# Patient Record
Sex: Male | Born: 1960 | Race: Black or African American | Hispanic: No | Marital: Married | Smoking: Never smoker
Health system: Southern US, Community
[De-identification: ages and names within clinical notes are randomized; demographics above are authoritative.]

## PROBLEM LIST (undated history)

## (undated) ENCOUNTER — Emergency Department (HOSPITAL_COMMUNITY): Admission: EM | Payer: No Typology Code available for payment source

## (undated) DIAGNOSIS — N289 Disorder of kidney and ureter, unspecified: Secondary | ICD-10-CM

## (undated) DIAGNOSIS — E785 Hyperlipidemia, unspecified: Secondary | ICD-10-CM

## (undated) DIAGNOSIS — I639 Cerebral infarction, unspecified: Secondary | ICD-10-CM

## (undated) DIAGNOSIS — E119 Type 2 diabetes mellitus without complications: Secondary | ICD-10-CM

## (undated) DIAGNOSIS — I10 Essential (primary) hypertension: Secondary | ICD-10-CM

## (undated) DIAGNOSIS — D869 Sarcoidosis, unspecified: Secondary | ICD-10-CM

## (undated) DIAGNOSIS — I2510 Atherosclerotic heart disease of native coronary artery without angina pectoris: Secondary | ICD-10-CM

## (undated) HISTORY — PX: CARDIAC CATHETERIZATION: SHX172

## (undated) HISTORY — DX: Hyperlipidemia, unspecified: E78.5

## (undated) HISTORY — PX: JOINT REPLACEMENT: SHX530

## (undated) HISTORY — PX: ROTATOR CUFF REPAIR: SHX139

## (undated) HISTORY — DX: Cerebral infarction, unspecified: I63.9

---

## 2002-05-01 ENCOUNTER — Emergency Department (HOSPITAL_COMMUNITY): Admission: EM | Admit: 2002-05-01 | Discharge: 2002-05-02 | Payer: Self-pay | Admitting: Emergency Medicine

## 2004-09-15 ENCOUNTER — Emergency Department (HOSPITAL_COMMUNITY): Admission: EM | Admit: 2004-09-15 | Discharge: 2004-09-15 | Payer: Self-pay | Admitting: Emergency Medicine

## 2005-10-12 ENCOUNTER — Emergency Department (HOSPITAL_COMMUNITY): Admission: EM | Admit: 2005-10-12 | Discharge: 2005-10-13 | Payer: Self-pay | Admitting: Emergency Medicine

## 2005-11-20 ENCOUNTER — Encounter: Admission: RE | Admit: 2005-11-20 | Discharge: 2005-11-20 | Payer: Self-pay | Admitting: Emergency Medicine

## 2006-03-19 ENCOUNTER — Ambulatory Visit (HOSPITAL_BASED_OUTPATIENT_CLINIC_OR_DEPARTMENT_OTHER): Admission: RE | Admit: 2006-03-19 | Discharge: 2006-03-19 | Payer: Self-pay | Admitting: Orthopedic Surgery

## 2006-11-12 ENCOUNTER — Ambulatory Visit: Admission: RE | Admit: 2006-11-12 | Discharge: 2006-11-12 | Payer: Self-pay | Admitting: Orthopedic Surgery

## 2006-11-24 ENCOUNTER — Encounter: Admission: RE | Admit: 2006-11-24 | Discharge: 2007-02-22 | Payer: Self-pay | Admitting: Emergency Medicine

## 2007-01-07 ENCOUNTER — Inpatient Hospital Stay (HOSPITAL_COMMUNITY): Admission: RE | Admit: 2007-01-07 | Discharge: 2007-01-09 | Payer: Self-pay | Admitting: Orthopedic Surgery

## 2007-06-29 ENCOUNTER — Emergency Department (HOSPITAL_COMMUNITY): Admission: EM | Admit: 2007-06-29 | Discharge: 2007-06-29 | Payer: Self-pay | Admitting: Emergency Medicine

## 2007-06-29 ENCOUNTER — Ambulatory Visit: Payer: Self-pay | Admitting: Vascular Surgery

## 2008-04-03 ENCOUNTER — Emergency Department (HOSPITAL_COMMUNITY): Admission: EM | Admit: 2008-04-03 | Discharge: 2008-04-04 | Payer: Self-pay | Admitting: Emergency Medicine

## 2010-09-24 ENCOUNTER — Encounter: Admission: RE | Admit: 2010-09-24 | Discharge: 2010-09-24 | Payer: Self-pay | Admitting: Specialist

## 2010-10-08 ENCOUNTER — Encounter
Admission: RE | Admit: 2010-10-08 | Discharge: 2010-10-08 | Payer: Self-pay | Source: Home / Self Care | Attending: Specialist | Admitting: Specialist

## 2011-03-15 NOTE — Op Note (Signed)
NAME:  Brendan [name], Brendan [name] NO.:  [id number]   MEDICAL RECORD NO.:  [id number]          PATIENT TYPE:  AMB   LOCATION:  DSC                          FACILITY:  [facility]   PHYSICIAN:  Loreta Ave, M.D. DATE OF BIRTH:  1961-08-24   DATE OF PROCEDURE:  03/19/2006  DATE OF DISCHARGE:                                 OPERATIVE REPORT   PREOPERATIVE DIAGNOSIS:  Right knee degenerative arthritis, chondromalacia,  patella, loose bodies, medial and lateral meniscus tear with previous  partial open lateral meniscectomy.   POSTOPERATIVE DIAGNOSIS:  Right knee degenerative arthritis, chondromalacia,  patella, loose bodies, medial and lateral meniscus tear with previous  partial open lateral meniscectomy with grade 3-4 chondral changes to the  patellofemoral, right and lateral compartment.   PROCEDURE:  Right knee examination under anesthesia, arthroscopy, partial  medial and lateral meniscectomy, diffuse chondroplasty in all three  compartments, removal of numerous chondral loose bodies as well as intra-  articular adhesions.   SURGEON:  Loreta Ave, M.D.   ANESTHESIA:  Knee block with sedation.   SPECIMENS:  None.   CULTURES:  None.   DRESSING:  Soft compressive.   TOURNIQUET:  Not employed.   PROCEDURE:  The patient was brought to the operating room.  After adequate  anesthesia had been obtained, the right knee examined.  Mild flexion  contracture.  Further good flexion.  Marked adhesions, decreased  patellofemoral mobility but stable ligaments.  Tourniquet leg holder  applied.  Leg prepped and draped in the usual sterile fashion.  Three ports  were created, one superolateral, and one in each medial and lateral  parapatellar.  Inflow catheter introduced into the knee.  A standard  arthroscope was introduced, and the knee inspected.  Marked intra-articular  adhesions, all debrided.  Good patellofemoral tracking but diffuse  tethering.  Grade 3, even some grade  4 changes, __________ .  Chondroplasty  to a stable surface.  Numerous chondral and osteochondral loose bodies  throughout the knee, debrided.  ACL had spurring in the notch but was still  present and functional.  Medial compartment:  Minimal changes but a large  flap tear off the anterior horn.  The anterior horn removed and __________ .  Posterior medial meniscus intact.  Laterally, extensive grade 2, 3, and even  grade 4 changes, both sides of the joint.  What was left of the lateral  meniscus had marked intrameniscal tearing, numerous flaps.  Taken all the  way out to a stable rim, removing most of what was left of the meniscus.  At  completion, the entire knee examined.  All uneven surfaces were smoothed  with chondroplasty.  All recesses examined to be sure.  All accessible loose  bodies removed.  Instruments and fluid removed.  The portals of the knee were injected with  Marcaine.  The portals closed with 4-0 nylon.  Sterile compressive dressing  applied.  Anesthesia reversed.  Brought to the recovery room.  Tolerated  surgery well.  No complications.      Loreta Ave, M.D.  Electronically Signed     DFM/MEDQ  D:  03/19/2006  T:  03/19/2006  Job:  [id number]

## 2011-03-15 NOTE — Op Note (Signed)
NAME:  Brendan [name], NEAR NO.:  [id number]   MEDICAL RECORD NO.:  [id number]          PATIENT TYPE:  INP   LOCATION:  5011                         FACILITY:  [facility]   PHYSICIAN:  Loreta Ave, M.D. DATE OF BIRTH:  01-30-1961   DATE OF PROCEDURE:  01/07/2007  DATE OF DISCHARGE:                               OPERATIVE REPORT   PREOPERATIVE DIAGNOSIS:  End-stage degenerative arthritis, right knee,  valgus alignment flexure contracture.   POSTOPERATIVE DIAGNOSIS:  End-stage degenerative arthritis, right knee,  valgus alignment flexure contracture.   OPERATIVE PROCEDURE:  Right total knee replacement, minimally invasive  system, Stryker triathlon component, cemented peg, posterior stabilized  #6 femoral component, cemented #6 tibial component with 9 mm posterior  stabilized polyethylene insert.  Resurfacing 35 x 10 mm medial offset  peg patella component.  All components cemented.  Soft tissue balancing.   SURGEON:  Loreta Ave, M.D.   ASSISTANT:  Genene Churn. Denton Meek.   ANESTHESIA:  General.   BLOOD LOSS:  Minimal.   TOURNIQUET TIME:  One hour 40 minutes.   SPECIMENS:  None.   CULTURES:  None.   COMPLICATIONS:  None.   DRESSINGS:  Sterile compressive with knee immobilizer.   DRAINS:  Hemovac x1.   PROCEDURE:  Patient brought to the operating room and after adequate  anesthesia had been obtained, tourniquet was applied.  Prepped and  draped in the usual sterile fashion.  Exsanguinated with elevation  Esmarch.  Tourniquet inflated to 350 mmHg.  Knee examined.  5-7 degree  flexion contracture.  Further flexion to 90.  Valgus alignment not very  correctable.  Anterior incision above the patella to tibial tubercle.  Medial arthrotomy up to the supermedial border of the patella and then  vastus splitting for the minimally invasive system.  Knee exposed.  Loose bodies, periarticular spurs were evident.  Some menisci removed.  Grade 4 changes.  Bony  erosions of the lateral compartment.  Distal  femoral resection 12 mm with intramedullary guide set at 5 degrees of  valgus.  Epicondylar access marked.  Jig was put in place, definitive  cuts made.  Sized and fitted for a pegged #6 component.  Extramedullary  guide.  Sufficient resection of the tibia with 3-degree posterior slope  cut protecting collaterals.  Sized and fitted for a #6 component.  Patella resection posterior 10 mm sized, drilled and fitted for a 35 x  10 mm component.  Trial was put in place, #6 on the femur, #6 on the  tibia, a 35 on the patella.  After soft tissue balancing, especially  laterally, and cleaning out the lateral gutter, I had a 9 mm insert that  gave me full extension, full flexion, nicely balanced knee, excellent  patellofemoral tracking.  Tibia was marked for rotation and hand reamed.  All trials were removed.  Copious irrigation with a pulse irrigating  device.  All recesses examined.  All loose fragments removed.  Cement  prepared and placed on all components which were firmly seated.  Excessive cement removed.  Polyethylene attached to the tibia.  Knee  reduced.  After the cement hardened, reexamined.  Full extension, full  flexion.  Good stability, good alignment, good patellofemoral tracking.  Hemovac placed and brought out through a separate stab wound.  Arthrotomy closed with #1 Vicryl, skin and subcutaneous tissue with  Vicryl and staples.  Knee injected with Marcaine and Hemovac clamp.  A  sterile compressive dressing applied.  Tourniquet inflated and removed.  Knee immobilizer applied.  Anesthesia reversed.  Brought to recovery  room.  Tolerated surgery well.  No complications.      Loreta Ave, M.D.  Electronically Signed     DFM/MEDQ  D:  01/07/2007  T:  01/08/2007  Job:  [id number]

## 2011-07-25 LAB — CBC
HCT: 44.7
Hemoglobin: 15.3
MCHC: 34.2
MCV: 90.3
Platelets: 165
RBC: 4.95
RDW: 13.3
WBC: 21.9 — ABNORMAL HIGH

## 2011-07-25 LAB — DIFFERENTIAL
Basophils Absolute: 0
Basophils Relative: 0
Eosinophils Absolute: 0
Eosinophils Relative: 0
Lymphocytes Relative: 6 — ABNORMAL LOW
Lymphs Abs: 1.3
Monocytes Absolute: 1.5 — ABNORMAL HIGH
Monocytes Relative: 7
Neutro Abs: 19 — ABNORMAL HIGH
Neutrophils Relative %: 87 — ABNORMAL HIGH

## 2011-07-25 LAB — URINE MICROSCOPIC-ADD ON

## 2011-07-25 LAB — POCT CARDIAC MARKERS
CKMB, poc: <1 — ABNORMAL LOW
Myoglobin, poc: 169
Operator id: 261601
Troponin i, poc: <0.05

## 2011-07-25 LAB — URINALYSIS, ROUTINE W REFLEX MICROSCOPIC
Bilirubin Urine: NEGATIVE
Glucose, UA: NEGATIVE
Ketones, ur: 40 — AB
Nitrite: POSITIVE — AB
Protein, ur: 100 — AB
Specific Gravity, Urine: 1.033 — ABNORMAL HIGH
Urobilinogen, UA: 1
pH: 6

## 2011-07-25 LAB — POCT I-STAT, CHEM 8
Chloride: 100
HCT: 46
Hemoglobin: 15.6
Potassium: 3.5
Sodium: 134 — ABNORMAL LOW

## 2011-08-09 LAB — DIFFERENTIAL
Basophils Absolute: 0
Basophils Relative: 0
Eosinophils Absolute: 0.2
Eosinophils Relative: 2
Lymphocytes Relative: 12
Lymphs Abs: 1.1
Monocytes Absolute: 0.6
Monocytes Relative: 6
Neutro Abs: 7.3
Neutrophils Relative %: 80 — ABNORMAL HIGH

## 2011-08-09 LAB — BASIC METABOLIC PANEL
BUN: 9
CO2: 28
Calcium: 9.6
Chloride: 99
Creatinine, Ser: 1.16
GFR calc Af Amer: >60
GFR calc non Af Amer: >60
Glucose, Bld: 202 — ABNORMAL HIGH
Potassium: 4.2
Sodium: 136

## 2011-08-09 LAB — CBC
HCT: 41.5
Hemoglobin: 14.1
MCHC: 34
MCV: 88.1
Platelets: 222
RBC: 4.71
RDW: 14.9 — ABNORMAL HIGH
WBC: 9.1

## 2011-08-09 LAB — URIC ACID: Uric Acid, Serum: 3.9

## 2011-08-09 LAB — D-DIMER, QUANTITATIVE: D-Dimer, Quant: >20 — ABNORMAL HIGH

## 2020-08-24 ENCOUNTER — Encounter (HOSPITAL_COMMUNITY): Payer: Self-pay | Admitting: Emergency Medicine

## 2020-08-24 ENCOUNTER — Emergency Department (HOSPITAL_COMMUNITY)
Admission: EM | Admit: 2020-08-24 | Discharge: 2020-08-24 | Disposition: A | Discharge status: Home / Self Care | Payer: No Typology Code available for payment source | Attending: Emergency Medicine | Admitting: Emergency Medicine

## 2020-08-24 ENCOUNTER — Other Ambulatory Visit: Payer: Self-pay

## 2020-08-24 DIAGNOSIS — Z794 Long term (current) use of insulin: Secondary | ICD-10-CM | POA: Insufficient documentation

## 2020-08-24 DIAGNOSIS — Z91040 Latex allergy status: Secondary | ICD-10-CM | POA: Insufficient documentation

## 2020-08-24 DIAGNOSIS — I10 Essential (primary) hypertension: Secondary | ICD-10-CM | POA: Insufficient documentation

## 2020-08-24 DIAGNOSIS — Z7984 Long term (current) use of oral hypoglycemic drugs: Secondary | ICD-10-CM | POA: Diagnosis not present

## 2020-08-24 DIAGNOSIS — R200 Anesthesia of skin: Secondary | ICD-10-CM | POA: Insufficient documentation

## 2020-08-24 DIAGNOSIS — E119 Type 2 diabetes mellitus without complications: Secondary | ICD-10-CM | POA: Diagnosis not present

## 2020-08-24 HISTORY — DX: Disorder of kidney and ureter, unspecified: N28.9

## 2020-08-24 HISTORY — DX: Sarcoidosis, unspecified: D86.9

## 2020-08-24 HISTORY — DX: Essential (primary) hypertension: I10

## 2020-08-24 HISTORY — DX: Type 2 diabetes mellitus without complications: E11.9

## 2020-08-24 LAB — PROTIME-INR
INR: 1.5 — ABNORMAL HIGH (ref 0.8–1.2)
Prothrombin Time: 17.3 seconds — ABNORMAL HIGH (ref 11.4–15.2)

## 2020-08-24 LAB — BASIC METABOLIC PANEL
Anion gap: 11 (ref 5–15)
BUN: 14 mg/dL (ref 6–20)
CO2: 26 mmol/L (ref 22–32)
Calcium: 9.2 mg/dL (ref 8.9–10.3)
Chloride: 98 mmol/L (ref 98–111)
Creatinine, Ser: 1.81 mg/dL — ABNORMAL HIGH (ref 0.61–1.24)
GFR, Estimated: 43 mL/min — ABNORMAL LOW (ref >60)
Glucose, Bld: 224 mg/dL — ABNORMAL HIGH (ref 70–99)
Potassium: 3.9 mmol/L (ref 3.5–5.1)
Sodium: 135 mmol/L (ref 135–145)

## 2020-08-24 LAB — CBC
HCT: 36.2 % — ABNORMAL LOW (ref 39.0–52.0)
Hemoglobin: 11.9 g/dL — ABNORMAL LOW (ref 13.0–17.0)
MCH: 30.7 pg (ref 26.0–34.0)
MCHC: 32.9 g/dL (ref 30.0–36.0)
MCV: 93.5 fL (ref 80.0–100.0)
Platelets: 157 10*3/uL (ref 150–400)
RBC: 3.87 MIL/uL — ABNORMAL LOW (ref 4.22–5.81)
RDW: 12.8 % (ref 11.5–15.5)
WBC: 10.4 10*3/uL (ref 4.0–10.5)
nRBC: 0 % (ref 0.0–0.2)

## 2020-08-24 LAB — TROPONIN I (HIGH SENSITIVITY): Troponin I (High Sensitivity): 11 ng/L (ref <18)

## 2020-08-24 NOTE — ED Provider Notes (Signed)
[facility]   CSN: [id number] Arrival date & time: 08/24/20  1121     History Chief Complaint  Patient presents with   Numbness    Brendan [name] is a 59 y.o. male possible history of diabetes, hypertension, kidney disease who presents for evaluation of numbness to his left upper lip as well as his left thumb and index finger.  He states this has been going on for 4 days.  4 days ago, he had a total knee replacement surgery when he woke up from anesthesia, he noticed numbness to the lip and to his left thumb and index finger.  He was told that it was an effect from anesthesia and that it would wear off.  He states that it has continued to persist and has been constant since then, prompting ED visit.  No weakness noted.  He states he has not had any other symptoms.  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  He has not had any weakness, slurred speech, difficulty speaking.  He is currently on Xarelto for 14 days which he states he has been compliant with.  He denies any chest pain, difficulty breathing, abdominal pain, nausea/vomiting.  The history is provided by the patient.       Past Medical History:  Diagnosis Date   Diabetes mellitus without complication (HCC)    Hypertension    Kidney disease    Sarcoidosis     There are no problems to display for this patient.   Past Surgical History:  Procedure Laterality Date   JOINT REPLACEMENT         No family history on file.  Social History   Tobacco Use   Smoking status: Never Smoker   Smokeless tobacco: Never Used  Substance Use Topics   Alcohol use: Not Currently   Drug use: Not Currently    Home Medications Prior to Admission medications   Medication Sig Start Date End Date Taking? Authorizing Provider  clobetasol ointment (TEMOVATE) 0.05 % Apply 1  application topically 2 (two) times daily. 04/17/20  Yes [provider]  glimepiride (AMARYL) 4 MG tablet Take 4 mg by mouth daily. 04/20/20  Yes [provider]  insulin aspart (NOVOLOG) 100 UNIT/ML injection Inject 40 Units into the skin daily as needed for high blood sugar.    Yes [provider]  metFORMIN (GLUCOPHAGE) 1000 MG tablet Take 1,000 mg by mouth 2 (two) times daily. 04/18/20  Yes [provider]  methocarbamol (ROBAXIN) 750 MG tablet Take 750 mg by mouth every 6 (six) hours as needed. 08/22/20  Yes [provider]  oxyCODONE (OXY IR/ROXICODONE) 5 MG immediate release tablet Take 5 mg by mouth daily as needed for severe pain.  08/22/20  Yes [provider]  XARELTO 10 MG TABS tablet Take 10 mg by mouth daily. 08/22/20  Yes [provider]    Allergies    Latex and Lisinopril  Review of Systems   Review of Systems  Constitutional: Negative for fever.  Respiratory: Negative for cough and shortness of breath.   Cardiovascular: Negative for chest pain.  Gastrointestinal: Negative for abdominal pain, nausea and vomiting.  Genitourinary: Negative for dysuria and hematuria.  Neurological: Positive for numbness. Negative for weakness and headaches.  All other systems reviewed and are negative.   Physical Exam Updated Vital Signs BP (!) 111/100 (BP  Location: Right Arm)    Pulse (!) 112    Temp 99.1 F (37.3 C) (Oral)    Resp 17    Ht 5\' 10"  (1.778 m)    Wt 97.1 kg    SpO2 98%    BMI 30.71 kg/m   Physical Exam Vitals and nursing note reviewed.  Constitutional:      Appearance: Normal appearance. He is well-developed.  HENT:     Head: Normocephalic and atraumatic.     Mouth/Throat:     Comments: Slight questionable swelling of left upper lip. No overlying warmth, erythema.  No tongue swelling.  Uvula is midline.  Airways patent palpitations intact.  No surrounding erythema, edema.  No obvious dental abscess. Eyes:      General: Lids are normal.     Conjunctiva/sclera: Conjunctivae normal.     Pupils: Pupils are equal, round, and reactive to light.  Cardiovascular:     Rate and Rhythm: Regular rhythm. Tachycardia present.     Pulses: Normal pulses.          Radial pulses are 2+ on the right side and 2+ on the left side.       Dorsalis pedis pulses are 2+ on the right side and 2+ on the left side.     Heart sounds: Normal heart sounds. No murmur heard.  No friction rub. No gallop.   Pulmonary:     Effort: Pulmonary effort is normal.     Breath sounds: Normal breath sounds.     Comments: Lungs clear to auscultation bilaterally.  Symmetric chest rise.  No wheezing, rales, rhonchi. Abdominal:     Palpations: Abdomen is soft. Abdomen is not rigid.     Tenderness: There is no abdominal tenderness. There is no guarding.     Comments: Abdomen is soft, non-distended, non-tender. No rigidity, No guarding. No peritoneal signs.  Musculoskeletal:        General: Normal range of motion.     Cervical back: Full passive range of motion without pain.  Skin:    General: Skin is warm and dry.     Capillary Refill: Capillary refill takes less than 2 seconds.     Comments: Good distal cap refill. LUE is not dusky in appearance or cool to touch.  Neurological:     Mental Status: He is alert and oriented to person, place, and time.     Comments: Cranial nerves III-XII intact Follows commands, Moves all extremities  5/5 strength to BUE and BLE  Reports subjective decreased sensation isolated to the left upper lip as well as the left index and left thumb.  He is able to differentiate sharp and dull and almost every dermatome distribution.  The only one that he mixed up was on the right C6 area (unaffected side).  Otherwise sensation intact throughout all major nerve distributions No pronator drift. No gait abnormalities  No slurred speech. No facial droop.   Psychiatric:        Speech: Speech normal.      ED  Results / Procedures / Treatments   Labs (all labs ordered are listed, but only abnormal results are displayed) Labs Reviewed  BASIC METABOLIC PANEL - Abnormal; Notable for the following components:      Result Value   Glucose, Bld 224 (*)    Creatinine, Ser 1.81 (*)    GFR, Estimated 43 (*)    All other components within normal limits  CBC - Abnormal; Notable for the following components:  RBC 3.87 (*)    Hemoglobin 11.9 (*)    HCT 36.2 (*)    All other components within normal limits  PROTIME-INR - Abnormal; Notable for the following components:   Prothrombin Time 17.3 (*)    INR 1.5 (*)    All other components within normal limits  TROPONIN I (HIGH SENSITIVITY)  TROPONIN I (HIGH SENSITIVITY)    EKG EKG Interpretation  Date/Time:  Thursday August 24 2020 11:30:03 EDT Ventricular Rate:  125 PR Interval:  132 QRS Duration: 88 QT Interval:  308 QTC Calculation: 444 R Axis:   169 Text Interpretation: Sinus tachycardia Indeterminate axis Abnormal QRS-T angle, consider primary T wave abnormality Abnormal ECG No STEMI Confirmed by Alona Bene [phone]) on 08/24/2020 11:40:47 AM   Radiology No results found.  Procedures Procedures (including critical care time)  Medications Ordered in ED Medications - No data to display  ED Course  I have reviewed the triage vital signs and the nursing notes.  Pertinent labs & imaging results that were available during my care of the patient were reviewed by me and considered in my medical decision making (see chart for details).    MDM Rules/Calculators/A&P                          59 year old male past history of diabetes who presents for evaluation of numbness to his left upper lip and left index and thumb finger x4 days.  States symptoms began after he woke up from anesthesia after having a total knee replacement.  He states that it has continued to persist, prompting ED visit.  He has not had any slurred speech, speech  difficulty, weakness.  Currently on Xarelto.  Initially arrival, he is afebrile, nontoxic-appearing.  He is slightly tachycardic.  Vitals otherwise stable.  On exam, he has no evidence of weakness.  He reports his symptoms are isolated to the left upper lip.  It does not involve the entire V3 distribution or the entire left side of his face.  Additionally, is isolated to the left index and left thumb.  He is able to differentiate between sharp and dull in both the spots.  No obvious neuro deficits.  Suspect this is likely effect from anesthesia.  History/physical exam is not concerning for CVA given that it is isolated to these 2 spots that does not involve continuous nerve distributions.  His neuro exam is reassuring.  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  At this time, he has no evidence concerning for anaphylaxis.  Additionally, he has good pulses, cap refill.  We will plan to check labs and observe here in the ED.  BMP shows glucose of 224, Cr of 1.81. CBC shows no leukocytosis. Hgb stable at 11.9.  INR is 1.5.  Discussed with Dr. Lockie Mola who evaluated patient.  At this time, patient is stable.  He is not having any trouble breathing and has been tolerating secretions without any difficulty.  Question if the lip is some slight angioedema secondary to losartan use.  At this time, he is tolerating secretions, able to talk with any difficulty and has no vomiting or difficulty breathing.  Instructed patient to stop his losartan at this time and follow-up with his primary care doctor.  Additionally, history/physical exam is not concerning for CVA.  Indication for emergent MRI imaging at  this time.  He has good pulses, good cap refill.  Do not suspect ischemic etiology. based on history/physical exam.  Portions of this note were generated with Dragon dictation software. Dictation errors may  occur despite best attempts at proofreading.   Final Clinical Impression(s) / ED Diagnoses Final diagnoses:  Numbness    Rx / DC Orders ED Discharge Orders    None       Maxwell Caul, PA-C 08/24/20 1644    Virgina Norfolk, DO 08/24/20 1806

## 2020-08-24 NOTE — ED Provider Notes (Signed)
Medical screening examination/treatment/procedure(s) were conducted as a shared visit with non-physician practitioner(s) and myself.  I personally evaluated the patient during the encounter. Briefly, the patient is a 59 y.o. male with history of high blood pressure presents the ED with numbness.  Normal vitals except for mild tachycardia.  Otherwise no fever.  Had left knee surgery this past week.  Currently on Xarelto.  No shortness of breath.  States that he has had some numbness of his left upper lip and some pins-and-needles feeling to his left thumb and index finger since his surgery about 4 5 days ago.  Neurologically otherwise intact.  Symptoms are consistent with a stroke.  I am actually concerned for may be some mild angioedema to the left upper lip.  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  Is on losartan now but has not taken for the last several days.  Swelling to the upper lip is very mild.  No other respiratory distress.  Recommend that he stopped his losartan.  Overall just having some paresthesias in his hand not consistent with stroke but will have him follow-up with his primary care doctor.  Understands return precautions and discharged in ED in good condition.  This chart was dictated using voice recognition software.  Despite best efforts to proofread,  errors can occur which can change the documentation meaning.     EKG Interpretation  Date/Time:  Thursday August 24 2020 11:30:03 EDT Ventricular Rate:  125 PR Interval:  132 QRS Duration: 88 QT Interval:  308 QTC Calculation: 444 R Axis:   169 Text Interpretation: Sinus tachycardia Indeterminate axis Abnormal QRS-T angle, consider primary T wave abnormality Abnormal ECG No STEMI Confirmed by Alona Bene [phone] on 08/24/2020 11:40:47 AM           Virgina Norfolk, DO 08/24/20 1531

## 2020-08-24 NOTE — ED Notes (Signed)
Pt discharged via wheelchair. All questions and concerns addressed. No complaints at this time.  ° °

## 2020-08-24 NOTE — Discharge Instructions (Addendum)
  We think this may be still effect of anesthesia. If your symptoms persist, you will need to follow up with the referred neurology office. Please call them.   Additionally, immediately be some degree of swelling on the left that could be contributed from your losartan.  Please have your primary care doctor evaluate whether or not you need to be on this medication.  Return to emergency department for any weakness, difficulty speaking, difficulty walking or any other worsening concerning symptoms.

## 2020-08-24 NOTE — ED Triage Notes (Signed)
Pt c/o left side numbness on his face and arm since Monday, pt states he just had knee replaced this past Monday. Pt is AO x 4 no neuro deficit noticed on triage.

## 2020-12-21 ENCOUNTER — Ambulatory Visit: Payer: No Typology Code available for payment source | Admitting: Neurology

## 2021-01-01 ENCOUNTER — Emergency Department (HOSPITAL_COMMUNITY)
Admission: EM | Admit: 2021-01-01 | Discharge: 2021-01-01 | Disposition: A | Discharge status: Home / Self Care | Payer: No Typology Code available for payment source | Attending: Emergency Medicine | Admitting: Emergency Medicine

## 2021-01-01 ENCOUNTER — Emergency Department (HOSPITAL_COMMUNITY): Payer: No Typology Code available for payment source

## 2021-01-01 ENCOUNTER — Encounter (HOSPITAL_COMMUNITY): Payer: Self-pay

## 2021-01-01 DIAGNOSIS — I10 Essential (primary) hypertension: Secondary | ICD-10-CM | POA: Diagnosis not present

## 2021-01-01 DIAGNOSIS — Z79899 Other long term (current) drug therapy: Secondary | ICD-10-CM | POA: Diagnosis not present

## 2021-01-01 DIAGNOSIS — Z7984 Long term (current) use of oral hypoglycemic drugs: Secondary | ICD-10-CM | POA: Diagnosis not present

## 2021-01-01 DIAGNOSIS — Z91040 Latex allergy status: Secondary | ICD-10-CM | POA: Insufficient documentation

## 2021-01-01 DIAGNOSIS — E119 Type 2 diabetes mellitus without complications: Secondary | ICD-10-CM | POA: Insufficient documentation

## 2021-01-01 DIAGNOSIS — Z794 Long term (current) use of insulin: Secondary | ICD-10-CM | POA: Insufficient documentation

## 2021-01-01 DIAGNOSIS — R200 Anesthesia of skin: Secondary | ICD-10-CM | POA: Diagnosis present

## 2021-01-01 DIAGNOSIS — R202 Paresthesia of skin: Secondary | ICD-10-CM | POA: Diagnosis not present

## 2021-01-01 DIAGNOSIS — Z9660 Presence of unspecified orthopedic joint implant: Secondary | ICD-10-CM | POA: Insufficient documentation

## 2021-01-01 DIAGNOSIS — Z87891 Personal history of nicotine dependence: Secondary | ICD-10-CM | POA: Diagnosis not present

## 2021-01-01 DIAGNOSIS — Z7982 Long term (current) use of aspirin: Secondary | ICD-10-CM | POA: Diagnosis not present

## 2021-01-01 LAB — URINALYSIS, ROUTINE W REFLEX MICROSCOPIC
Bilirubin Urine: NEGATIVE
Glucose, UA: 50 mg/dL — AB
Hgb urine dipstick: NEGATIVE
Ketones, ur: NEGATIVE mg/dL
Leukocytes,Ua: NEGATIVE
Nitrite: NEGATIVE
Protein, ur: NEGATIVE mg/dL
Specific Gravity, Urine: 1.023 (ref 1.005–1.030)
pH: 5 (ref 5.0–8.0)

## 2021-01-01 LAB — DIFFERENTIAL
Abs Immature Granulocytes: 0.01 10*3/uL (ref 0.00–0.07)
Basophils Absolute: 0.1 10*3/uL (ref 0.0–0.1)
Basophils Relative: 1 %
Eosinophils Absolute: 0.2 10*3/uL (ref 0.0–0.5)
Eosinophils Relative: 4 %
Immature Granulocytes: 0 %
Lymphocytes Relative: 28 %
Lymphs Abs: 1.6 10*3/uL (ref 0.7–4.0)
Monocytes Absolute: 0.6 10*3/uL (ref 0.1–1.0)
Monocytes Relative: 11 %
Neutro Abs: 3.1 10*3/uL (ref 1.7–7.7)
Neutrophils Relative %: 56 %

## 2021-01-01 LAB — COMPREHENSIVE METABOLIC PANEL
ALT: 29 U/L (ref 0–44)
AST: 24 U/L (ref 15–41)
Albumin: 4.2 g/dL (ref 3.5–5.0)
Alkaline Phosphatase: 81 U/L (ref 38–126)
Anion gap: 7 (ref 5–15)
BUN: 16 mg/dL (ref 6–20)
CO2: 27 mmol/L (ref 22–32)
Calcium: 9.6 mg/dL (ref 8.9–10.3)
Chloride: 104 mmol/L (ref 98–111)
Creatinine, Ser: 1.47 mg/dL — ABNORMAL HIGH (ref 0.61–1.24)
GFR, Estimated: 54 mL/min — ABNORMAL LOW (ref >60)
Glucose, Bld: 99 mg/dL (ref 70–99)
Potassium: 4.1 mmol/L (ref 3.5–5.1)
Sodium: 138 mmol/L (ref 135–145)
Total Bilirubin: 0.9 mg/dL (ref 0.3–1.2)
Total Protein: 7.8 g/dL (ref 6.5–8.1)

## 2021-01-01 LAB — CBC
HCT: 43.7 % (ref 39.0–52.0)
Hemoglobin: 14.2 g/dL (ref 13.0–17.0)
MCH: 29.8 pg (ref 26.0–34.0)
MCHC: 32.5 g/dL (ref 30.0–36.0)
MCV: 91.6 fL (ref 80.0–100.0)
Platelets: 181 10*3/uL (ref 150–400)
RBC: 4.77 MIL/uL (ref 4.22–5.81)
RDW: 13.6 % (ref 11.5–15.5)
WBC: 5.5 10*3/uL (ref 4.0–10.5)
nRBC: 0 % (ref 0.0–0.2)

## 2021-01-01 LAB — CBG MONITORING, ED: Glucose-Capillary: 220 mg/dL — ABNORMAL HIGH (ref 70–99)

## 2021-01-01 MED ORDER — LORAZEPAM 1 MG PO TABS
1.0000 mg | ORAL_TABLET | Freq: Once | ORAL | Status: AC
  Administered 2021-01-01: 1 mg via ORAL
  Filled 2021-01-01: qty 1

## 2021-01-01 NOTE — ED Triage Notes (Signed)
Pt presents with c/o right arm numbness since yesterday. Pt reports a hx of several strokes in the past during surgery. Neuro exam intact, no deficits noted.

## 2021-01-01 NOTE — Discharge Instructions (Addendum)
You will need to make an appointment to follow-up with your neurologist within the next 5 to 7 days for reassessment.  If you have any new or worsening symptoms please return to the emergency department immediately.

## 2021-01-01 NOTE — ED Provider Notes (Signed)
[facility]-EMERGENCY DEPT Provider Note   CSN: [id number] Arrival date & time: 01/01/21  1040     History Chief Complaint  Patient presents with  . Numbness    Brendan [name] is a 60 y.o. male.  HPI     Pt is a 60 y/o male with a h/o DM, HTN, kidney disease, sarcoidosis who presents to the ED today for eval of numbness. States yesterday he woke up and his right arm was numb and feels funny. Denies any weakness to the arm and denies any weakness/numbness to the right leg. Denies any vision changes, speech problems, difficulty with word finding, facial droop, or other symptoms. States he has hx of CVA.   Past Medical History:  Diagnosis Date  . Diabetes mellitus without complication (HCC)   . Hypertension   . Kidney disease   . Sarcoidosis     There are no problems to display for this patient.   Past Surgical History:  Procedure Laterality Date  . JOINT REPLACEMENT         History reviewed. No pertinent family history.  Social History   Tobacco Use  . Smoking status: Never Smoker  . Smokeless tobacco: Never Used  Substance Use Topics  . Alcohol use: Not Currently  . Drug use: Not Currently    Home Medications Prior to Admission medications   Medication Sig Start Date End Date Taking? Authorizing Provider  aspirin 81 MG EC tablet Take 81 mg by mouth daily. 11/28/20 11/28/21 Yes [provider]  atorvastatin (LIPITOR) 40 MG tablet Take 20 mg by mouth at bedtime. 12/27/20  Yes [provider]  clobetasol ointment (TEMOVATE) 0.05 % Apply 1 application topically 2 (two) times daily. 04/17/20  Yes [provider]  diazepam (VALIUM) 5 MG tablet Take 5 mg by mouth daily as needed for anxiety. 12/14/20  Yes [provider]  diclofenac Sodium (VOLTAREN) 1 % GEL Apply 2 g topically daily as needed (pain).   Yes [provider]  Dulaglutide 1.5 MG/0.5ML SOPN Inject 1.5 mg into the skin once a week. 02/24/18   Yes [provider]  ergocalciferol (VITAMIN D2) 1.25 MG (50000 UT) capsule Take 50,000 Units by mouth once a week.   Yes [provider]  glimepiride (AMARYL) 4 MG tablet Take 4 mg by mouth daily. 04/20/20  Yes [provider]  insulin glargine (LANTUS) 100 UNIT/ML injection Inject 40 Units into the skin See admin instructions. Takes only if blood sugar is over 170 - Sliding scale 09/07/20  Yes [provider]  losartan (COZAAR) 50 MG tablet Take 50 mg by mouth daily.   Yes [provider]  metFORMIN (GLUCOPHAGE) 1000 MG tablet Take 1,000 mg by mouth 2 (two) times daily. 04/18/20  Yes [provider]    Allergies    Latex, Lisinopril, and Sildenafil  Review of Systems   Review of Systems  Constitutional: Negative for chills and fever.  HENT: Negative for ear pain and sore throat.   Eyes: Negative for visual disturbance.  Respiratory: Negative for cough and shortness of breath.   Cardiovascular: Negative for chest pain.  Gastrointestinal: Negative for abdominal pain, constipation, diarrhea, nausea and vomiting.  Genitourinary: Negative for dysuria and hematuria.  Musculoskeletal: Negative for back pain and neck pain.  Skin: Negative for color change and rash.  Neurological: Positive for numbness (right arm). Negative for weakness.  All other systems reviewed and are negative.   Physical Exam Updated Vital Signs BP Marland Kitchen)  130/91   Pulse 89   Temp 98.7 F (37.1 C)   Resp 15   Ht 5\' 10"  (1.778 m)   Wt 96.2 kg   SpO2 99%   BMI 30.42 kg/m   Physical Exam Vitals and nursing note reviewed.  Constitutional:      Appearance: He is well-developed and well-nourished.  HENT:     Head: Normocephalic and atraumatic.  Eyes:     Conjunctiva/sclera: Conjunctivae normal.  Cardiovascular:     Rate and Rhythm: Normal rate and regular rhythm.     Heart sounds: Normal heart sounds. No murmur heard.   Pulmonary:     Effort: Pulmonary  effort is normal. No respiratory distress.     Breath sounds: Normal breath sounds. No wheezing, rhonchi or rales.  Abdominal:     General: Bowel sounds are normal.     Palpations: Abdomen is soft.     Tenderness: There is no abdominal tenderness. There is no guarding or rebound.  Musculoskeletal:        General: No edema.     Cervical back: Neck supple.  Skin:    General: Skin is warm and dry.  Neurological:     Mental Status: He is alert.     Comments: Mental Status:  Alert, thought content appropriate, able to give a coherent history. Speech fluent without evidence of aphasia. Able to follow 2 step commands without difficulty.  Cranial Nerves:  II: pupils equal, round, reactive to light III,IV, VI: ptosis not present, extra-ocular motions intact bilaterally  V,VII: smile symmetric, facial light touch sensation equal VIII: hearing grossly normal to voice  X: uvula elevates symmetrically  XI: bilateral shoulder shrug symmetric and strong XII: midline tongue extension without fassiculations Motor:  Normal tone. 5/5 strength of BUE and BLE major muscle groups including strong and equal grip strength and dorsiflexion/plantar flexion Sensory: light touch normal in all extremities.   Psychiatric:        Mood and Affect: Mood and affect normal.     ED Results / Procedures / Treatments   Labs (all labs ordered are listed, but only abnormal results are displayed) Labs Reviewed  COMPREHENSIVE METABOLIC PANEL - Abnormal; Notable for the following components:      Result Value   Creatinine, Ser 1.47 (*)    GFR, Estimated 54 (*)    All other components within normal limits  URINALYSIS, ROUTINE W REFLEX MICROSCOPIC - Abnormal; Notable for the following components:   Glucose, UA 50 (*)    All other components within normal limits  CBG MONITORING, ED - Abnormal; Notable for the following components:   Glucose-Capillary 220 (*)    All other components within normal limits  CBC   DIFFERENTIAL    EKG None  Radiology MR BRAIN WO CONTRAST  Result Date: 01/01/2021 CLINICAL DATA:  Right arm numbness beginning yesterday EXAM: MRI HEAD WITHOUT CONTRAST TECHNIQUE: Multiplanar, multiecho pulse sequences of the brain and surrounding structures were obtained without intravenous contrast. COMPARISON:  10/08/2010 FINDINGS: Brain: Diffusion imaging does not show any acute or subacute infarction or other cause of restricted diffusion. No focal abnormality affects the brainstem or cerebellum. Cerebral hemispheres show an old small right occipital cortical infarction. There are old infarctions in the right thalamus. There are moderate chronic small-vessel ischemic changes elsewhere within the cerebral hemispheric deep and subcortical white matter. No large vessel territory distribution stroke. No mass, recent hemorrhage, hydrocephalus or extra-axial collection. Vascular: Major vessels at the base of the brain show flow.  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 Skull and upper cervical spine: Negative Sinuses/Orbits: Clear/normal Other: None IMPRESSION: 1. No acute finding by MRI. 2. 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 Old infarctions in the right thalamus. Moderate chronic small-vessel ischemic changes of the cerebral hemispheric white matter. 3. 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 Electronically Signed   By: Paulina Fusi M.D.   On: 01/01/2021 14:08    Procedures Procedures   Medications Ordered in ED Medications  LORazepam (ATIVAN) tablet 1 mg (1 mg Oral Given 01/01/21 1240)    ED Course  I have reviewed the triage vital signs and the nursing notes.  Pertinent labs & imaging results that were available during my care of the patient were reviewed by me and considered in my medical decision making (see chart for  details).    MDM Rules/Calculators/A&P                         60 y/o M presenting for RUE numbness that started yesterday  Reviewed/interpreted labs CBC wnl BMP with elevated cr, appears to be at baseline, otherwise unremarkable UA negative  MRI - 1. No acute finding by MRI. 2. 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 Old infarctions in the right thalamus. Moderate chronic small-vessel ischemic changes of the cerebral hemispheric white matter. 3. 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  3:30 PM CONSULT with Dr. Dierdre Harness with neurology who reviewed the patients chart and states that pt likely safe for discharge to f/u with his outpatient neurologist. Does not feel pt requires admission.   Reassessed patient.  Discussed results of MRI and plan for discharge with neurology follow-up.  He voices understanding of the plan and reasons to return.  All Questions answered.  Stable for discharge.  Final Clinical Impression(s) / ED Diagnoses Final diagnoses:  Arm paresthesia, right    Rx / DC Orders ED Discharge Orders    None       Karrie Meres, PA-C 01/01/21 1544    Tegeler, Canary Brim, MD 01/01/21 [phone]

## 2021-01-01 NOTE — ED Notes (Signed)
Gave patient a [place] sandwich and shasta coke

## 2021-01-01 NOTE — ED Notes (Signed)
Patient transported to MRI 

## 2021-01-01 NOTE — ED Provider Notes (Incomplete)
[facility]-EMERGENCY DEPT Provider Note   CSN: [id number] Arrival date & time: 01/01/21  1040     History Chief Complaint  Patient presents with  . Numbness    Brendan [name] is a 60 y.o. male.  HPI   Pt is a 61 y/o male with a h/o DM, HTN, kidney disease, sarcoidosis who presents to the ED today for eval of numbness.  Past Medical History:  Diagnosis Date  . Diabetes mellitus without complication (HCC)   . Hypertension   . Kidney disease   . Sarcoidosis     There are no problems to display for this patient.   Past Surgical History:  Procedure Laterality Date  . JOINT REPLACEMENT         History reviewed. No pertinent family history.  Social History   Tobacco Use  . Smoking status: Never Smoker  . Smokeless tobacco: Never Used  Substance Use Topics  . Alcohol use: Not Currently  . Drug use: Not Currently    Home Medications Prior to Admission medications   Medication Sig Start Date End Date Taking? Authorizing Provider  clobetasol ointment (TEMOVATE) 0.05 % Apply 1 application topically 2 (two) times daily. 04/17/20   [provider]  glimepiride (AMARYL) 4 MG tablet Take 4 mg by mouth daily. 04/20/20   [provider]  insulin aspart (NOVOLOG) 100 UNIT/ML injection Inject 40 Units into the skin daily as needed for high blood sugar.     [provider]  metFORMIN (GLUCOPHAGE) 1000 MG tablet Take 1,000 mg by mouth 2 (two) times daily. 04/18/20   [provider]  methocarbamol (ROBAXIN) 750 MG tablet Take 750 mg by mouth every 6 (six) hours as needed. 08/22/20   [provider]  oxyCODONE (OXY IR/ROXICODONE) 5 MG immediate release tablet Take 5 mg by mouth daily as needed for severe pain.  08/22/20   [provider]  XARELTO 10 MG TABS tablet Take 10 mg by mouth daily. 08/22/20   [provider]    Allergies    Latex and Lisinopril  Review of Systems   Review of  Systems  Physical Exam Updated Vital Signs BP (!) 145/97   Pulse 94   Temp 98.7 F (37.1 C) (Oral)   Resp 18   Ht 5\' 10"  (1.778 m)   Wt 96.2 kg   SpO2 100%   BMI 30.42 kg/m   Physical Exam  ED Results / Procedures / Treatments   Labs (all labs ordered are listed, but only abnormal results are displayed) Labs Reviewed  CBG MONITORING, ED - Abnormal; Notable for the following components:      Result Value   Glucose-Capillary 220 (*)    All other components within normal limits    EKG None  Radiology No results found.  Procedures Procedures {Remember to document critical care time when appropriate:1}  Medications Ordered in ED Medications - No data to display  ED Course  I have reviewed the triage vital signs and the nursing notes.  Pertinent labs & imaging results that were available during my care of the patient were reviewed by me and considered in my medical decision making (see chart for details).    MDM Rules/Calculators/A&P                          *** Final Clinical Impression(s) / ED Diagnoses Final diagnoses:  None    Rx / DC Orders ED Discharge Orders  None

## 2021-07-05 DIAGNOSIS — G4733 Obstructive sleep apnea (adult) (pediatric): Secondary | ICD-10-CM | POA: Diagnosis not present

## 2021-10-05 DIAGNOSIS — D860 Sarcoidosis of lung: Secondary | ICD-10-CM | POA: Diagnosis not present

## 2021-10-05 DIAGNOSIS — E119 Type 2 diabetes mellitus without complications: Secondary | ICD-10-CM | POA: Diagnosis not present

## 2021-10-10 DIAGNOSIS — H579 Unspecified disorder of eye and adnexa: Secondary | ICD-10-CM | POA: Diagnosis not present

## 2021-10-10 DIAGNOSIS — M19041 Primary osteoarthritis, right hand: Secondary | ICD-10-CM | POA: Diagnosis not present

## 2021-10-10 DIAGNOSIS — E1136 Type 2 diabetes mellitus with diabetic cataract: Secondary | ICD-10-CM | POA: Diagnosis not present

## 2021-10-10 DIAGNOSIS — H2513 Age-related nuclear cataract, bilateral: Secondary | ICD-10-CM | POA: Diagnosis not present

## 2021-10-10 DIAGNOSIS — M19042 Primary osteoarthritis, left hand: Secondary | ICD-10-CM | POA: Diagnosis not present

## 2021-11-05 DIAGNOSIS — M65321 Trigger finger, right index finger: Secondary | ICD-10-CM | POA: Diagnosis not present

## 2021-11-05 DIAGNOSIS — Z8673 Personal history of transient ischemic attack (TIA), and cerebral infarction without residual deficits: Secondary | ICD-10-CM | POA: Diagnosis not present

## 2021-11-05 DIAGNOSIS — G5602 Carpal tunnel syndrome, left upper limb: Secondary | ICD-10-CM | POA: Diagnosis not present

## 2021-11-05 DIAGNOSIS — M19041 Primary osteoarthritis, right hand: Secondary | ICD-10-CM | POA: Diagnosis not present

## 2021-12-11 DIAGNOSIS — K08439 Partial loss of teeth due to caries, unspecified class: Secondary | ICD-10-CM | POA: Diagnosis not present

## 2021-12-13 DIAGNOSIS — R531 Weakness: Secondary | ICD-10-CM | POA: Diagnosis not present

## 2021-12-13 DIAGNOSIS — M79641 Pain in right hand: Secondary | ICD-10-CM | POA: Diagnosis not present

## 2021-12-13 DIAGNOSIS — M79642 Pain in left hand: Secondary | ICD-10-CM | POA: Diagnosis not present

## 2021-12-13 DIAGNOSIS — M65351 Trigger finger, right little finger: Secondary | ICD-10-CM | POA: Diagnosis not present

## 2021-12-30 ENCOUNTER — Emergency Department (HOSPITAL_COMMUNITY)
Admission: EM | Admit: 2021-12-30 | Discharge: 2021-12-30 | Disposition: A | Discharge status: Home / Self Care | Payer: No Typology Code available for payment source | Attending: Emergency Medicine | Admitting: Emergency Medicine

## 2021-12-30 ENCOUNTER — Other Ambulatory Visit: Payer: Self-pay

## 2021-12-30 ENCOUNTER — Encounter (HOSPITAL_COMMUNITY): Payer: Self-pay | Admitting: Emergency Medicine

## 2021-12-30 DIAGNOSIS — Z794 Long term (current) use of insulin: Secondary | ICD-10-CM | POA: Diagnosis not present

## 2021-12-30 DIAGNOSIS — Z91040 Latex allergy status: Secondary | ICD-10-CM | POA: Insufficient documentation

## 2021-12-30 DIAGNOSIS — I10 Essential (primary) hypertension: Secondary | ICD-10-CM | POA: Diagnosis not present

## 2021-12-30 DIAGNOSIS — R220 Localized swelling, mass and lump, head: Secondary | ICD-10-CM | POA: Diagnosis not present

## 2021-12-30 DIAGNOSIS — Z7984 Long term (current) use of oral hypoglycemic drugs: Secondary | ICD-10-CM | POA: Diagnosis not present

## 2021-12-30 MED ORDER — FAMOTIDINE 20 MG PO TABS
20.0000 mg | ORAL_TABLET | Freq: Once | ORAL | Status: AC
  Administered 2021-12-30: 20 mg via ORAL
  Filled 2021-12-30: qty 1

## 2021-12-30 MED ORDER — DIPHENHYDRAMINE HCL 25 MG PO CAPS
25.0000 mg | ORAL_CAPSULE | Freq: Once | ORAL | Status: AC
  Administered 2021-12-30: 25 mg via ORAL
  Filled 2021-12-30: qty 1

## 2021-12-30 MED ORDER — PREDNISONE 20 MG PO TABS
60.0000 mg | ORAL_TABLET | Freq: Once | ORAL | Status: AC
  Administered 2021-12-30: 60 mg via ORAL
  Filled 2021-12-30: qty 3

## 2021-12-30 NOTE — Discharge Instructions (Signed)
It was our pleasure to provide your ER care today - we hope that you feel better. ? ?Keep your head elevated. Icepack/coldpack to area. Take benadryl 25 mg every 6 hours today, then as need. Hold your losartan for now.   ? ?Follow up with primary care doctor in the next 1-2 days for recheck if symptoms fail to improve/resolve. ? ?Return to ER if worse, new symptoms, severe pain or swelling to area, fevers, or other concern.  ?

## 2021-12-30 NOTE — ED Provider Notes (Signed)
?[facility]-EMERGENCY DEPT ?Provider Note ? ? ?CSN: [id number] ?Arrival date & time: 12/30/21  2595 ? ?  ? ?History ? ?Chief Complaint  ?Patient presents with  ? Facial Swelling  ? ? ?Brendan [name] is a 61 y.o. male. ? ?Patient c/o swelling above left eye in past day. States awoke with swelling this AM, felt fine last night. Denies any new facial products or chemical exposure. Denies change in foods/diet. No new meds or change in meds. Denies outdoor allergen exposure or any type of bite/sting to eye. Not painful. Slightly itchy. No eye redness or discharge. No eye pain. No change in vision. No lip, mouth or tongue swelling. 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 Denies cough, runny nose or uri symptoms. No other skin lesions or swelling. No headache. Indicates otherwise does not feel sick or ill.  ? ?The history is provided by the patient and medical records.  ? ?  ? ?Home Medications ?Prior to Admission medications   ?Medication Sig Start Date End Date Taking? Authorizing Provider  ?atorvastatin (LIPITOR) 40 MG tablet Take 20 mg by mouth at bedtime. 12/27/20   [provider]  ?clobetasol ointment (TEMOVATE) 0.05 % Apply 1 application topically 2 (two) times daily. 04/17/20   [provider]  ?diazepam (VALIUM) 5 MG tablet Take 5 mg by mouth daily as needed for anxiety. 12/14/20   [provider]  ?diclofenac Sodium (VOLTAREN) 1 % GEL Apply 2 g topically daily as needed (pain).    [provider]  ?Dulaglutide 1.5 MG/0.5ML SOPN Inject 1.5 mg into the skin once a week. 02/24/18   [provider]  ?ergocalciferol (VITAMIN D2) 1.25 MG (50000 UT) capsule Take 50,000 Units by mouth once a week.    [provider]  ?glimepiride (AMARYL) 4 MG tablet Take 4 mg by mouth daily. 04/20/20   [provider]  ?insulin glargine (LANTUS) 100 UNIT/ML injection Inject 40 Units into the skin See admin instructions. Takes only if blood sugar  is over 170 - Sliding scale 09/07/20   [provider]  ?losartan (COZAAR) 50 MG tablet Take 50 mg by mouth daily.    [provider]  ?metFORMIN (GLUCOPHAGE) 1000 MG tablet Take 1,000 mg by mouth 2 (two) times daily. 04/18/20   [provider]  ?   ? ?Allergies    ?Latex, Lisinopril, and Sildenafil   ? ?Review of Systems   ?Review of Systems  ?Constitutional:  Negative for chills and fever.  ?HENT:  Negative for rhinorrhea and sore throat.   ?Eyes:  Negative for pain, discharge, redness and visual disturbance.  ?Respiratory:  Negative for cough and shortness of breath.   ?Cardiovascular:  Negative for chest pain and leg swelling.  ?Gastrointestinal:  Negative for abdominal pain and vomiting.  ?Genitourinary:  Negative for dysuria.  ?Musculoskeletal:  Negative for back pain and neck pain.  ?Skin:  Negative for rash.  ?Neurological:  Negative for headaches.  ?Hematological:  Does not bruise/bleed easily.  ?Psychiatric/Behavioral:  Negative for confusion.   ? ?Physical Exam ?Updated Vital Signs ?BP 136/87 (BP Location: Left Arm)   Pulse 76   Temp 97.8 ?F (36.6 ?C) (Oral)   Resp 18   SpO2 100%  ?Physical Exam ?Vitals and nursing note reviewed.  ?Constitutional:   ?   Appearance: Normal appearance. He is well-developed.  ?HENT:  ?   Head: Atraumatic.  ?   Comments: Mild edema/swelling above left eye/lid area. No abscess or stye to  lid. Conj normal. No orbital or periorbital cellulitis.  ?   Nose: Nose normal.  ?   Mouth/Throat:  ?   Mouth: Mucous membranes are moist.  ?   Pharynx: Oropharynx is clear.  ?   Comments: No mouth, throat or tongue swelling.  ?Eyes:  ?   General: No scleral icterus.    ?   Right eye: No discharge.     ?   Left eye: No discharge.  ?   Extraocular Movements: Extraocular movements intact.  ?   Conjunctiva/sclera: Conjunctivae normal.  ?   Pupils: Pupils are equal, round, and reactive to light.  ?Neck:  ?   Trachea: No tracheal deviation.  ?Cardiovascular:  ?   Rate  and Rhythm: Normal rate.  ?   Pulses: Normal pulses.  ?Pulmonary:  ?   Effort: Pulmonary effort is normal. No accessory muscle usage or respiratory distress.  ?Genitourinary: ?   Comments: No cva tenderness. ?Musculoskeletal:     ?   General: No swelling.  ?   Cervical back: Normal range of motion and neck supple. No rigidity.  ?   Right lower leg: No edema.  ?   Left lower leg: No edema.  ?Lymphadenopathy:  ?   Cervical: No cervical adenopathy.  ?Skin: ?   General: Skin is warm and dry.  ?   Findings: No rash.  ?Neurological:  ?   Mental Status: He is alert.  ?   Comments: Alert, speech clear.   ?Psychiatric:     ?   Mood and Affect: Mood normal.  ? ? ?ED Results / Procedures / Treatments   ?Labs ?(all labs ordered are listed, but only abnormal results are displayed) ?Labs Reviewed - No data to display ? ?EKG ?None ? ?Radiology ?No results found. ? ?Procedures ?Procedures  ? ? ?Medications Ordered in ED ?Medications  ?diphenhydrAMINE (BENADRYL) capsule 25 mg (25 mg Oral Given 12/30/21 0936)  ?famotidine (PEPCID) tablet 20 mg (20 mg Oral Given 12/30/21 0937)  ?predniSONE (DELTASONE) tablet 60 mg (60 mg Oral Given 12/30/21 0936)  ? ? ?ED Course/ Medical Decision Making/ A&P ?  ?                        ?Medical Decision Making ?Problems Addressed: ?Essential hypertension: chronic illness or injury ?Facial swelling: acute illness or injury ? ?Amount and/or Complexity of Data Reviewed ?External Data Reviewed: notes. ? ?Risk ?OTC drugs. ?Prescription drug management. ? ? ?No meds pta.  ? ?Etiology of symptoms not clear from hx.  ? ?Reviewed nursing notes and prior charts for additional history. External reports reviewed.  ? ?Will have hold losartan.  ? ?Prednisone po, benadryl po, pepcid po. ? ?Rec close pcp f/u. ? ?Return precautions provided.  ? ? ? ? ? ? ? ? ? ?Final Clinical Impression(s) / ED Diagnoses ?Final diagnoses:  ?None  ? ? ?Rx / DC Orders ?ED Discharge Orders   ? ? None  ? ?  ? ? ?  ?Cathren Laine, MD ?12/30/21  [phone] ? ?

## 2021-12-30 NOTE — ED Triage Notes (Signed)
Pt reports left eye swelling. Pt states he went to bed and it was "normal". When he woke up the eye was swollen and he had blurry vision. ?

## 2022-01-30 ENCOUNTER — Encounter (HOSPITAL_COMMUNITY): Payer: Self-pay

## 2022-01-30 ENCOUNTER — Other Ambulatory Visit: Payer: Self-pay

## 2022-01-30 ENCOUNTER — Emergency Department (HOSPITAL_COMMUNITY)
Admission: EM | Admit: 2022-01-30 | Discharge: 2022-01-31 | Disposition: A | Discharge status: Home / Self Care | Payer: No Typology Code available for payment source | Attending: Emergency Medicine | Admitting: Emergency Medicine

## 2022-01-30 DIAGNOSIS — N189 Chronic kidney disease, unspecified: Secondary | ICD-10-CM | POA: Diagnosis not present

## 2022-01-30 DIAGNOSIS — Z794 Long term (current) use of insulin: Secondary | ICD-10-CM | POA: Diagnosis not present

## 2022-01-30 DIAGNOSIS — Z91040 Latex allergy status: Secondary | ICD-10-CM | POA: Insufficient documentation

## 2022-01-30 DIAGNOSIS — I129 Hypertensive chronic kidney disease with stage 1 through stage 4 chronic kidney disease, or unspecified chronic kidney disease: Secondary | ICD-10-CM | POA: Insufficient documentation

## 2022-01-30 DIAGNOSIS — Z79899 Other long term (current) drug therapy: Secondary | ICD-10-CM | POA: Diagnosis not present

## 2022-01-30 DIAGNOSIS — Z7984 Long term (current) use of oral hypoglycemic drugs: Secondary | ICD-10-CM | POA: Diagnosis not present

## 2022-01-30 DIAGNOSIS — K08439 Partial loss of teeth due to caries, unspecified class: Secondary | ICD-10-CM | POA: Diagnosis not present

## 2022-01-30 DIAGNOSIS — E119 Type 2 diabetes mellitus without complications: Secondary | ICD-10-CM | POA: Diagnosis not present

## 2022-01-30 DIAGNOSIS — R220 Localized swelling, mass and lump, head: Secondary | ICD-10-CM | POA: Insufficient documentation

## 2022-01-30 MED ORDER — FAMOTIDINE 20 MG PO TABS: 20.0000 mg | ORAL_TABLET | Freq: Once | ORAL | Status: DC

## 2022-01-30 MED ORDER — FAMOTIDINE IN NACL 20-0.9 MG/50ML-% IV SOLN
20.0000 mg | INTRAVENOUS | Status: AC
  Administered 2022-01-30: 20 mg via INTRAVENOUS
  Filled 2022-01-30: qty 50

## 2022-01-30 MED ORDER — METHYLPREDNISOLONE SODIUM SUCC 125 MG IJ SOLR
125.0000 mg | Freq: Once | INTRAMUSCULAR | Status: AC
  Administered 2022-01-30: 125 mg via INTRAVENOUS
  Filled 2022-01-30: qty 2

## 2022-01-30 MED ORDER — PREDNISONE 20 MG PO TABS: 60.0000 mg | ORAL_TABLET | Freq: Once | ORAL | Status: DC

## 2022-01-30 NOTE — ED Provider Notes (Signed)
?[facility] ?Provider Note ? ? ?CSN: [id number] ?Arrival date & time: 01/30/22  2055 ? ?  ? ?History ? ?Chief Complaint  ?Patient presents with  ? Oral Swelling  ? ? ?Brendan [name] is a 61 y.o. male. ? ?The history is provided by the patient and medical records.  ? ?61 year old male with history of hypertension, sarcoidosis, diabetes, chronic kidney disease, presenting to the ED with upper lip swelling.  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  States normally only his lip wil swell up and will go away after taking Benadryl.  Today around 4:30 PM his entire upper lip swelled and has remained persistent despite taking 50 mg of Benadryl.  He denies any tongue swelling, difficulty swallowing, trouble speaking, or pain in the mouth.  He has not had any fever.  He denies any new medications, foods, cosmetic products, etc.  States he did see an allergist in the past about this-- told it was mostly seasonal allergens such as pollen, dust, grass, etc. ? ?Home Medications ?Prior to Admission medications   ?Medication Sig Start Date End Date Taking? Authorizing Provider  ?atorvastatin (LIPITOR) 40 MG tablet Take 20 mg by mouth at bedtime. 12/27/20   [provider]  ?clobetasol ointment (TEMOVATE) [id number] % Apply 1 application topically 2 (two) times daily. 04/17/20   [provider]  ?diazepam (VALIUM) 5 MG tablet Take 5 mg by mouth daily as needed for anxiety. 12/14/20   [provider]  ?diclofenac Sodium (VOLTAREN) 1 % GEL Apply 2 g topically daily as needed (pain).    [provider]  ?Dulaglutide 1.5 MG/0.5ML SOPN Inject 1.5 mg into the skin once a week. 02/24/18   [provider]  ?ergocalciferol (VITAMIN D2) 1.25 MG (50000 UT) capsule Take 50,000 Units by mouth once a week.    [provider]  ?glimepiride (AMARYL) 4 MG tablet Take 4 mg by mouth daily. 04/20/20   [provider]  ?insulin  glargine (LANTUS) 100 UNIT/ML injection Inject 40 Units into the skin See admin instructions. Takes only if blood sugar is over 170 - Sliding scale 09/07/20   [provider]  ?losartan (COZAAR) 50 MG tablet Take 50 mg by mouth daily.    [provider]  ?metFORMIN (GLUCOPHAGE) 1000 MG tablet Take 1,000 mg by mouth 2 (two) times daily. 04/18/20   [provider]  ?   ? ?Allergies    ?Latex, Lisinopril, and Sildenafil   ? ?Review of Systems   ?Review of Systems  ?HENT:  Positive for facial swelling.   ?All other systems reviewed and are negative. ? ?Physical Exam ?Updated Vital Signs ?BP (!) 170/104 (BP Location: Left Arm)   Pulse 91   Temp 98 ?F (36.7 ?C) (Oral)   Resp 20   SpO2 98%  ? ?Physical Exam ?Vitals and nursing note reviewed.  ?Constitutional:   ?   Appearance: He is well-developed.  ?HENT:  ?   Head: Normocephalic and atraumatic.  ?   Mouth/Throat:  ?   Comments: Diffuse upper lip swelling, no swelling noted of tongue, floor of mouth, or roof of mouth; handling secretions well, no stridor ?Eyes:  ?   Conjunctiva/sclera: Conjunctivae normal.  ?   Pupils: Pupils are equal, round, and reactive to light.  ?Cardiovascular:  ?   Rate and Rhythm: Normal rate and regular rhythm.  ?   Heart sounds: Normal heart sounds.  ?Pulmonary:  ?  Effort: Pulmonary effort is normal.  ?   Breath sounds: Normal breath sounds.  ?Abdominal:  ?   General: Bowel sounds are normal.  ?   Palpations: Abdomen is soft.  ?Musculoskeletal:     ?   General: Normal range of motion.  ?   Cervical back: Normal range of motion.  ?Skin: ?   General: Skin is warm and dry.  ?Neurological:  ?   Mental Status: He is alert and oriented to person, place, and time.  ? ? ?ED Results / Procedures / Treatments   ?Labs ?(all labs ordered are listed, but only abnormal results are displayed) ?Labs Reviewed - No data to display ? ?EKG ?None ? ?Radiology ?No results found. ? ?Procedures ?Procedures  ? ? ?Medications Ordered in  ED ?Medications  ?methylPREDNISolone sodium succinate (SOLU-MEDROL) 125 mg/2 mL injection 125 mg (125 mg Intravenous Given 01/30/22 2256)  ?famotidine (PEPCID) IVPB 20 mg premix (20 mg Intravenous New Bag/Given 01/30/22 2257)  ? ? ?ED Course/ Medical Decision Making/ A&P ?  ?                        ?Medical Decision Making ?Risk ?Prescription drug management. ? ? ?61 year old male presenting to the ED with upper lip swelling since 1630 this evening.  Hx of same without known cause.  Not currently on ACEI but does take ARB (Losartan) but has been on this for quite some time.  No other new medications, soaps, foods, etc. He did take 50mg  Benadryl at home prior to arrival without relief.  On exam he has diffuse upper lip swelling without extension to the floor/roof of mouth or tongue.  He is handling secretions well.  Normal phonation without stridor.  Will give solu-medrol, pepcid.  We will observe. ? ?2:19 AM ?Has been observed here for nearly 6 hours with frequent re-checks.  Right side of lip seems to have reduced swelling but left side still quite swollen.  No progression into the mouth or tongue since onset yesterday afternoon (nearly 10 hours ago).  Remains without difficulty swallowing or airway compromise at present.  Feel he is stable for discharge.  We will continue prednisone taper, also given prescription for EpiPen.  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  Strict instructions to return here for any new or acute changes. ? ?Final Clinical Impression(s) / ED Diagnoses ?Final diagnoses:  ?Lip swelling  ? ? ?Rx / DC Orders ?ED Discharge Orders   ? ?      Ordered  ?     ?     ?  EPINEPHrine 0.3 mg/0.3 mL IJ SOAJ injection  As needed       ? 01/31/22 0317  ?  predniSONE (DELTASONE) 20 MG tablet       ? 01/31/22 0317   ? ?  ?  ? ?  ? ? ?  ?Larene Pickett, PA-C ?01/31/22 [phone] ? ?  ?Lucrezia Starch, MD ?01/31/22 2214 ? ?

## 2022-01-30 NOTE — ED Triage Notes (Addendum)
Pt c/o lip swelling starting earlier today. 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 Pt states he took benadryl earlier, it did not help. Pt states he also has left hand and left arm numbness that has been going on since October 2021, pt states he sees a neurologist for the hand and arm numbness.  ?

## 2022-01-30 NOTE — ED Provider Triage Note (Signed)
Emergency Medicine Provider Triage Evaluation Note ? ?Brendan [name] , a 61 y.o. male  was evaluated in triage.  Pt complains of swelling of the  upper lid, onset around 1630 today. Patient has taken benadryl, total of 50 mg, since onset. Prior history of angioedema. Denies new medications or foods. ? ?Review of Systems  ?Positive: Angioedema of upper lip ?Negative: Wheezing, shortness of breath, difficulty swallowing. Nausea or vomiting. Skin rash ? ?Physical Exam  ?BP (!) 147/94   Pulse 100   Temp 97.9 ?F (36.6 ?C)   Resp 18   SpO2 99%  ?Gen:   Awake, no distress   ?Resp:  Normal effort. No wheezing. Lungs CTA bilaterally ?MSK:   Moves extremities without difficulty  ?Other:   ? ?Medical Decision Making  ?Medically screening exam initiated at 9:28 PM.  Appropriate orders placed.  Brendan [name] was informed that the remainder of the evaluation will be completed by another provider, this initial triage assessment does not replace that evaluation, and the importance of remaining in the ED until their evaluation is complete. ? ? ?  ?Felicie Morn, NP ?01/30/22 2132 ? ?

## 2022-01-31 MED ORDER — PREDNISONE 20 MG PO TABS: ORAL_TABLET | ORAL | 0 refills | Status: DC

## 2022-01-31 MED ORDER — EPINEPHRINE 0.3 MG/0.3ML IJ SOAJ: 0.3000 mg | INTRAMUSCULAR | 1 refills | Status: DC | PRN

## 2022-01-31 MED ORDER — EPINEPHRINE 0.3 MG/0.3ML IJ SOAJ: 0.3000 mg | INTRAMUSCULAR | 0 refills | Status: DC | PRN

## 2022-01-31 NOTE — Discharge Instructions (Addendum)
Take the prescribed medication as directed.  Can continue benadryl every 6-8 hours as well.  Keep epi pen with you at all times, use for severe tongue swelling/trouble swallowing/difficulty breathing.  If used, come to the hospital for evaluation. ?Follow-up with your primary care doctor. ?Return to the ED for new or worsening symptoms-- any worsening of swelling, trouble swallowing, sensation of tongue swelling, etc. ?

## 2022-02-06 DIAGNOSIS — I10 Essential (primary) hypertension: Secondary | ICD-10-CM | POA: Diagnosis not present

## 2022-02-06 DIAGNOSIS — E119 Type 2 diabetes mellitus without complications: Secondary | ICD-10-CM | POA: Diagnosis not present

## 2022-02-06 DIAGNOSIS — N183 Chronic kidney disease, stage 3 unspecified: Secondary | ICD-10-CM | POA: Diagnosis not present

## 2022-03-04 DIAGNOSIS — F332 Major depressive disorder, recurrent severe without psychotic features: Secondary | ICD-10-CM | POA: Diagnosis not present

## 2022-03-04 DIAGNOSIS — F411 Generalized anxiety disorder: Secondary | ICD-10-CM | POA: Diagnosis not present

## 2022-03-08 IMAGING — MR MR HEAD W/O CM
13 series · 48 of 48 positions shown · non-contrast
Comparison: 10/08/2010

CLINICAL DATA: Right arm numbness beginning yesterday

EXAM:
MRI HEAD WITHOUT CONTRAST
TECHNIQUE: Multiplanar, multiecho pulse sequences of the brain and surrounding
structures were obtained without intravenous contrast.

[Series 5: DWI · axial · 3.0mm · 1.36mm/px · z∈[+4,+169]mm · 8 of 116 slices shown (1 of 4)]
[im 1/116]
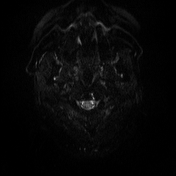
[im 17/116]
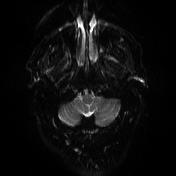
[im 33/116]
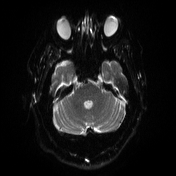
[im 50/116]
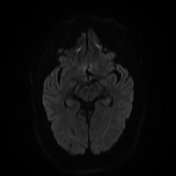
[im 66/116]
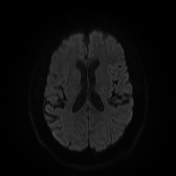
[im 83/116]
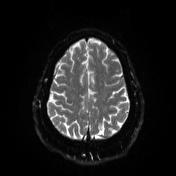
[im 99/116]
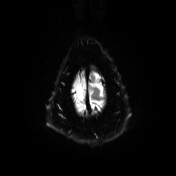
[im 116/116]
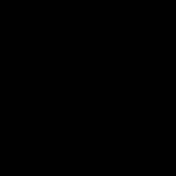

[Series 6: DWI · axial · 3.0mm · 1.36mm/px · z∈[+4,+166]mm · 3 of 57 slices shown (2 of 4)]
[im 1/57]
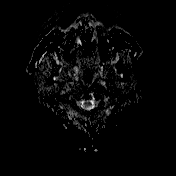
[im 29/57]
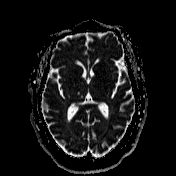
[im 57/57]
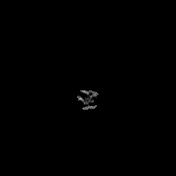

[Series 7: T1 · sagittal · 5.0mm · 0.75mm/px · 1 of 24 slices shown (1 of 2)]
[im 1/24]
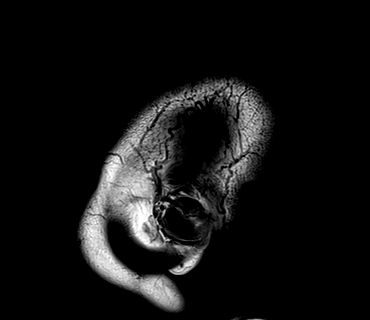

[Series 8: T2 · axial · 5.0mm · 0.62mm/px · z∈[-13,+160]mm · 2 of 28 slices shown (1 of 2)]
[im 1/28]
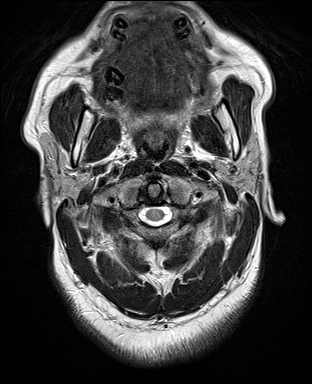
[im 28/28]
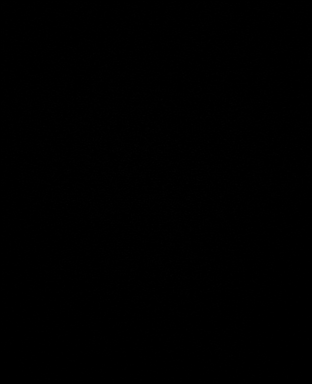

[Series 9: mip_images(sw) · axial · 24.0mm · 0.75mm/px · z∈[-3,+150]mm · 3 of 53 slices shown]
[im 1/53]
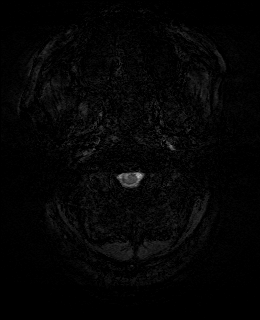
[im 27/53]
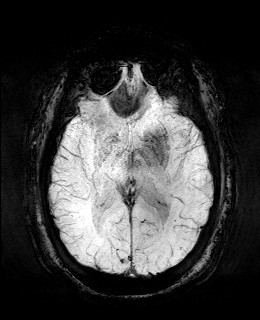
[im 53/53]
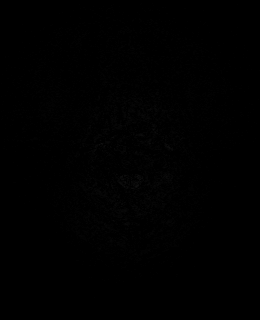

[Series 10: swi_images · axial · 3.0mm · 0.75mm/px · z∈[-14,+161]mm · 4 of 60 slices shown]
[im 1/60]
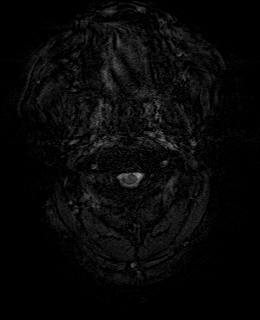
[im 20/60]
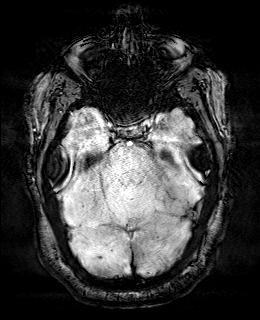
[im 40/60]
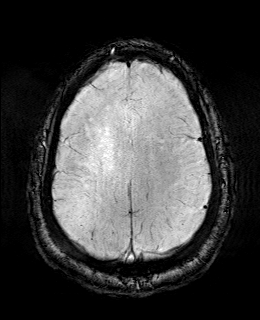
[im 60/60]
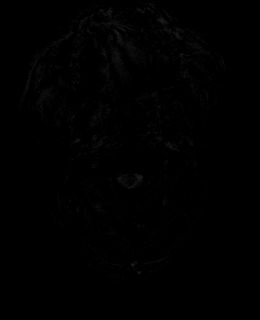

[Series 11: T1 · axial · 1.0mm · 0.94mm/px · z∈[-15,+158]mm · 11 of 176 slices shown (2 of 2)]
[im 1/176]
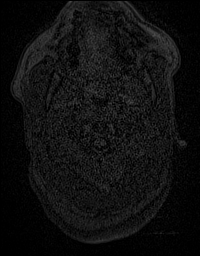
[im 18/176]
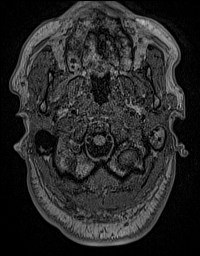
[im 36/176]
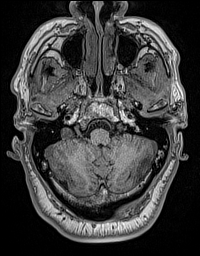
[im 53/176]
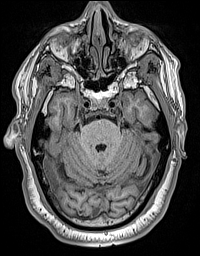
[im 71/176]
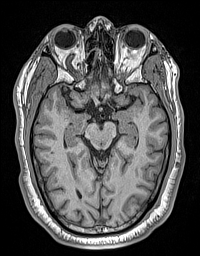
[im 88/176]
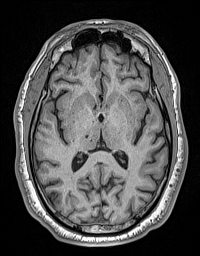
[im 106/176]
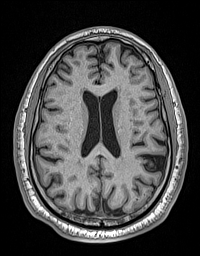
[im 123/176]
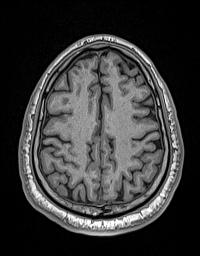
[im 141/176]
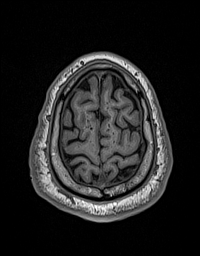
[im 158/176]
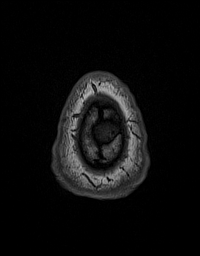
[im 176/176]
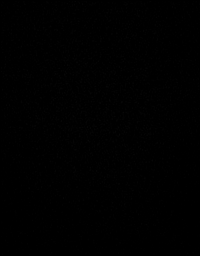

[Series 12: FLAIR · axial · 3.0mm · 0.75mm/px · z∈[-3,+150]mm · 3 of 53 slices shown]
[im 1/53]
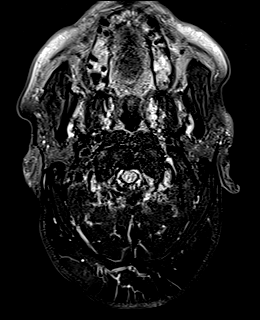
[im 27/53]
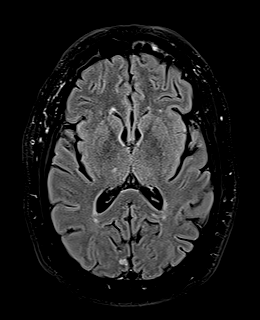
[im 53/53]
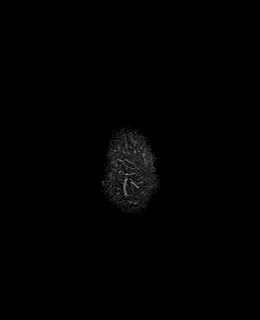

[Series 13: T2 · coronal · 5.0mm · 0.57mm/px · 2 of 36 slices shown (2 of 2)]
[im 1/36]
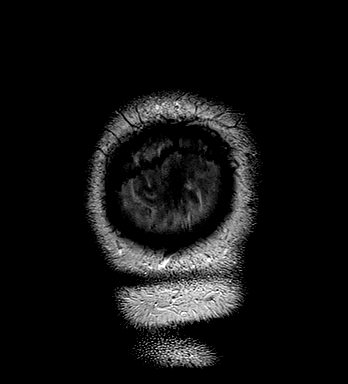
[im 36/36]
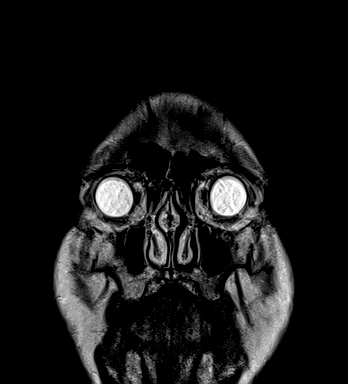

[Series 14: DWI · coronal · 5.0mm · 1.31mm/px · 4 of 72 slices shown (3 of 4)]
[im 1/72]
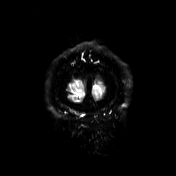
[im 24/72]
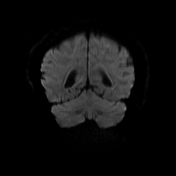
[im 48/72]
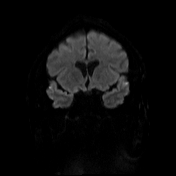
[im 72/72]
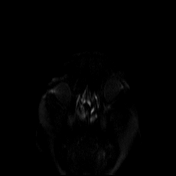

[Series 15: DWI · coronal · 5.0mm · 1.31mm/px · 2 of 36 slices shown (4 of 4)]
[im 1/36]
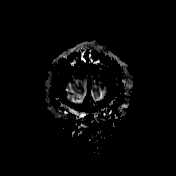
[im 36/36]
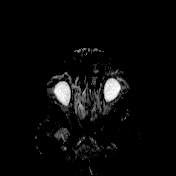

[Series 16: resolve_(id)_trace_cor_tracew · coronal · 5.0mm · 1.53mm/px · 3 of 52 slices shown]
[im 1/52]
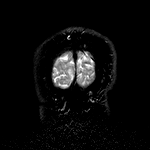
[im 26/52]
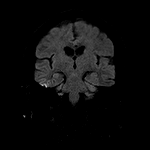
[im 52/52]
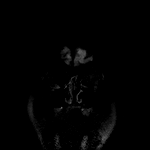

[Series 17: resolve_(id)_trace_cor_adc · coronal · 5.0mm · 1.53mm/px · 2 of 26 slices shown]
[im 1/26]
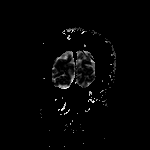
[im 26/26]
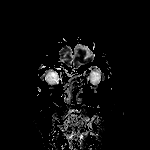

[48 of 48 positions shown; findings below may reference images not displayed]

FINDINGS: Brain: Diffusion imaging does not show any acute or subacute
infarction or other cause of restricted diffusion. No focal
abnormality affects the brainstem or cerebellum. Cerebral
hemispheres show an old small right occipital cortical infarction.
There are old infarctions in the right thalamus. There are moderate
chronic small-vessel ischemic changes elsewhere within the cerebral
hemispheric deep and subcortical white matter. No large vessel
territory distribution stroke. No mass, recent hemorrhage,
hydrocephalus or extra-axial collection.

Vascular: Major vessels at the base of the brain show flow. 3 mm
left posterior communicating artery aneurysm shown by MRA 11 years
ago cannot be visualized using this technique.

Skull and upper cervical spine: Negative

Sinuses/Orbits: Clear/normal

Other: None
IMPRESSION: 1. No acute finding by MRI.
2. Old small right occipital cortical infarction. Old infarctions in
the right thalamus. Moderate chronic small-vessel ischemic changes
of the cerebral hemispheric white matter.
3. 3 mm left posterior communicating artery aneurysm shown by MRA 11
years ago cannot be visualized using this technique.

## 2022-03-12 DIAGNOSIS — K08439 Partial loss of teeth due to caries, unspecified class: Secondary | ICD-10-CM | POA: Diagnosis not present

## 2022-03-14 DIAGNOSIS — E119 Type 2 diabetes mellitus without complications: Secondary | ICD-10-CM | POA: Diagnosis not present

## 2022-03-14 DIAGNOSIS — M545 Low back pain, unspecified: Secondary | ICD-10-CM | POA: Diagnosis not present

## 2022-04-02 DIAGNOSIS — M545 Low back pain, unspecified: Secondary | ICD-10-CM | POA: Diagnosis not present

## 2022-04-02 DIAGNOSIS — G8929 Other chronic pain: Secondary | ICD-10-CM | POA: Diagnosis not present

## 2022-04-03 ENCOUNTER — Encounter: Payer: Self-pay | Admitting: Pulmonary Disease

## 2022-04-03 ENCOUNTER — Ambulatory Visit (INDEPENDENT_AMBULATORY_CARE_PROVIDER_SITE_OTHER): Payer: No Typology Code available for payment source | Admitting: Pulmonary Disease

## 2022-04-03 ENCOUNTER — Ambulatory Visit (INDEPENDENT_AMBULATORY_CARE_PROVIDER_SITE_OTHER): Payer: No Typology Code available for payment source

## 2022-04-03 ENCOUNTER — Telehealth: Payer: Self-pay | Admitting: Pulmonary Disease

## 2022-04-03 VITALS — BP 142/88 | HR 88 | Ht 70.0 in | Wt 213.4 lb

## 2022-04-03 DIAGNOSIS — D869 Sarcoidosis, unspecified: Secondary | ICD-10-CM

## 2022-04-03 DIAGNOSIS — R0602 Shortness of breath: Secondary | ICD-10-CM

## 2022-04-03 MED ORDER — MOMETASONE FURO-FORMOTEROL FUM 100-5 MCG/ACT IN AERO: 2.0000 | INHALATION_SPRAY | Freq: Two times a day (BID) | RESPIRATORY_TRACT | 0 refills | Status: DC | PRN

## 2022-04-03 MED ORDER — FLUTICASONE-SALMETEROL 115-21 MCG/ACT IN AERO: 2.0000 | INHALATION_SPRAY | Freq: Two times a day (BID) | RESPIRATORY_TRACT | 12 refills | Status: DC

## 2022-04-03 NOTE — Patient Instructions (Addendum)
Try advair 115-5mcg 1-2 puffs twice daily as needed for cough, shortness of breath or wheezing. This is for possible reactive airways disease.  We will check Chest x-ray today  We will check breathing tests and EKG at follow up visit  Follow up in 2 months

## 2022-04-03 NOTE — Progress Notes (Signed)
Synopsis: Referred in June 2023 for Sarcoidosis from [place]  Subjective:   PATIENT ID: Brendan [name] GENDER: male DOB: 1961-01-14, MRN: [id number]  HPI  Chief Complaint  Patient presents with   Consult    [facility]. Was diagnosed back in 2010. States he has increased SOB with activity.    Kasson Lamere is a 61 year old male, never smoker with history of DMII and hypertension who is referred to pulmonary clinic for sarcoidosis.   He was diagnosed with sarcoidosis in 2010 and treated with steroids for 1 year then methotrexate for 2-3 years. He was previously monitored with PFTs. He had a swollen lymph node of his right neck removed in 2016 which showed evidence of sarcoidosis. He has bi-annual opathalmology visits and no evidence of inflammation noted 04/13/21. He has intermittent shortness of breath and cough. He has multiple awakenings at night due to dyspnea despite using CPAP regularly. He has follow up at [facility]. He is using albuterol inhaler as needed which does provided some relief.   He is retired. He worked in the [employer]. 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 Got married at 57, divorced at 30. Lives with second  wife x 31 years. His 3 children are grown and independent. 37, 30 and 24.  Past Medical History:  Diagnosis Date   Diabetes mellitus without complication (HCC)    Hypertension    Kidney disease    Sarcoidosis      No family history on file.   Social History   Socioeconomic History   Marital status: Married    Spouse name: Not on file   Number of children: Not on file   Years of education: Not on file   Highest education level: Not on file  Occupational History   Not on file  Tobacco Use   Smoking status: Never   Smokeless tobacco: Never  Substance and Sexual Activity   Alcohol use: Not Currently   Drug use: Not  Currently   Sexual activity: Not on file  Other Topics Concern   Not on file  Social History Narrative   Not on file   Social Determinants of Health   Financial Resource Strain: Not on file  Food Insecurity: Not on file  Transportation Needs: Not on file  Physical Activity: Not on file  Stress: Not on file  Social Connections: Not on file  Intimate Partner Violence: Not on file     Allergies  Allergen Reactions   Latex Swelling   Lisinopril Swelling   Sildenafil Palpitations    Other reaction(s): Headache, Palpitation, Headache, Palpitation, Headache, Palpitations     Outpatient Medications Prior to Visit  Medication Sig Dispense Refill   albuterol (VENTOLIN HFA) 108 (90 Base) MCG/ACT inhaler Inhale 1-2 puffs into the lungs as needed.     amoxicillin (AMOXIL) 500 MG capsule TAKE FOUR CAPSULES BY MOUTH ONCE 1 HOUR PRIOR TO THE DENTAL TREATMENT     aspirin EC 81 MG tablet Take 81 mg by mouth daily. Swallow whole.     atorvastatin (LIPITOR) 40 MG tablet Take 20 mg by mouth at bedtime.     clobetasol ointment (TEMOVATE) 0.05 % Apply 1 application topically 2 (two) times daily.     diazepam (VALIUM) 5 MG tablet Take 5 mg by mouth daily as needed for anxiety.     Dulaglutide 1.5 MG/0.5ML SOPN  Inject 1.5 mg into the skin once a week.     EPINEPHrine 0.3 mg/0.3 mL IJ SOAJ injection Inject 0.3 mg into the muscle as needed for anaphylaxis. 1 each 0   ergocalciferol (VITAMIN D2) 1.25 MG (50000 UT) capsule Take 50,000 Units by mouth once a week.     escitalopram (LEXAPRO) 10 MG tablet Take 10 mg by mouth daily.     glimepiride (AMARYL) 4 MG tablet Take 4 mg by mouth daily.     losartan (COZAAR) 50 MG tablet Take 50 mg by mouth daily.     traZODone (DESYREL) 100 MG tablet Take 100 mg by mouth at bedtime.     zolpidem (AMBIEN) 10 MG tablet Take 1 tablet by mouth at bedtime.     diclofenac Sodium (VOLTAREN) 1 % GEL Apply 2 g topically daily as needed (pain).     insulin glargine (LANTUS)  100 UNIT/ML injection Inject 40 Units into the skin See admin instructions. Takes only if blood sugar is over 170 - Sliding scale     metFORMIN (GLUCOPHAGE) 1000 MG tablet Take 1,000 mg by mouth 2 (two) times daily.     predniSONE (DELTASONE) 20 MG tablet Take 40 mg by mouth daily for 3 days, then  by mouth daily for 3 days, then  daily for 3 days 12 tablet 0   No facility-administered medications prior to visit.   Review of Systems  Constitutional:  Negative for chills, fever, malaise/fatigue and weight loss.  HENT:  Negative for congestion, sinus pain and sore throat.   Eyes: Negative.   Respiratory:  Positive for shortness of breath. Negative for cough, hemoptysis, sputum production and wheezing.   Cardiovascular:  Negative for chest pain, palpitations, orthopnea, claudication and leg swelling.  Gastrointestinal:  Negative for abdominal pain, heartburn, nausea and vomiting.  Genitourinary: Negative.   Musculoskeletal:  Positive for joint pain. Negative for myalgias.  Skin:  Negative for rash.  Neurological:  Negative for weakness.  Endo/Heme/Allergies: Negative.   Psychiatric/Behavioral:  Positive for depression. The patient is nervous/anxious.    Objective:   Vitals:   04/03/22 0937  BP: (!) 142/88  Pulse: 88  SpO2: 97%  Weight: 213 lb 6.4 oz (96.8 kg)  Height:  (1.778 m)   Physical Exam Constitutional:      General: He is not in acute distress. HENT:     Head: Normocephalic and atraumatic.  Eyes:     Extraocular Movements: Extraocular movements intact.     Conjunctiva/sclera: Conjunctivae normal.     Pupils: Pupils are equal, round, and reactive to light.  Cardiovascular:     Rate and Rhythm: Normal rate and regular rhythm.     Pulses: Normal pulses.     Heart sounds: Normal heart sounds. No murmur heard. Pulmonary:     Effort: Pulmonary effort is normal.     Breath sounds: Normal breath sounds.  Abdominal:     General: Bowel sounds are normal.      Palpations: Abdomen is soft.  Musculoskeletal:     Right lower leg: No edema.     Left lower leg: No edema.  Lymphadenopathy:     Cervical: No cervical adenopathy.  Skin:    General: Skin is warm and dry.  Neurological:     General: No focal deficit present.     Mental Status: He is alert.  Psychiatric:        Mood and Affect: Mood normal.        Behavior: Behavior normal.  Thought Content: Thought content normal.        Judgment: Judgment normal.   CBC    Component Value Date/Time   WBC 5.5 01/01/2021 1248   RBC 4.77 01/01/2021 1248   HGB 14.2 01/01/2021 1248   HCT 43.7 01/01/2021 1248   PLT 181 01/01/2021 1248   MCV 91.6 01/01/2021 1248   MCH 29.8 01/01/2021 1248   MCHC 32.5 01/01/2021 1248   RDW 13.6 01/01/2021 1248   LYMPHSABS 1.6 01/01/2021 1248   MONOABS 0.6 01/01/2021 1248   EOSABS 0.2 01/01/2021 1248   BASOSABS 0.1 01/01/2021 1248      Latest Ref Rng & Units 01/01/2021   12:48 PM 08/24/2020   11:40 AM 04/03/2008   11:04 PM  BMP  Glucose 70 - 99 mg/dL 99   287   681    BUN 6 - 20 mg/dL 16   14   13     Creatinine 0.61 - 1.24 mg/dL   1.57   1.5    Sodium 135 - 145 mmol/L 138   135   134    Potassium 3.5 - 5.1 mmol/L 4.1   3.9   3.5    Chloride 98 - 111 mmol/L 104   98   100    CO2 22 - 32 mmol/L 27   26     Calcium 8.9 - 10.3 mg/dL 9.6   9.2      Chest imaging:  PFT:     View : No data to display.          Labs:  Path:  Echo 01/08/21: LVEF 50-55%. Mild concentric hypertrophy. Patent foramen ovale. RV is normal.   Heart Catheterization:  Lower Extremtiy 01/10/21 Bilaterally 01/23/21 Negative for DVT bilaterally  Assessment & Plan:   Sarcoidosis - Plan: fluticasone-salmeterol (ADVAIR HFA) 115-21 MCG/ACT inhaler, DG Chest 2 View, Pulmonary Function Test  Discussion: Ezequias Lard is a 61 year old male, never smoker with history of DMII and hypertension who is referred to pulmonary clinic for sarcoidosis.   We will check a chest  x-ray today. He is to start advair HFA 115-52mcg 2 puffs twice daily as needed for dyspnea and cough. We will check pulmonary function tests and EKG at next visit. He has regular follow up with ophthalmology.   Follow up in 2 months.  22m, MD [facility] Office: [phone]   Current Outpatient Medications:    albuterol (VENTOLIN HFA) 108 (90 Base) MCG/ACT inhaler, Inhale 1-2 puffs into the lungs as needed., Disp: , Rfl:    amoxicillin (AMOXIL) 500 MG capsule, TAKE FOUR CAPSULES BY MOUTH ONCE 1 HOUR PRIOR TO THE DENTAL TREATMENT, Disp: , Rfl:    aspirin EC 81 MG tablet, Take 81 mg by mouth daily. Swallow whole., Disp: , Rfl:    atorvastatin (LIPITOR) 40 MG tablet, Take 20 mg by mouth at bedtime., Disp: , Rfl:    clobetasol ointment (TEMOVATE) 0.05 %, Apply 1 application topically 2 (two) times daily., Disp: , Rfl:    diazepam (VALIUM) 5 MG tablet, Take 5 mg by mouth daily as needed for anxiety., Disp: , Rfl:    Dulaglutide 1.5 MG/0.5ML SOPN, Inject 1.5 mg into the skin once a week., Disp: , Rfl:    EPINEPHrine 0.3 mg/0.3 mL IJ SOAJ injection, Inject 0.3 mg into the muscle as needed for anaphylaxis., Disp: 1 each, Rfl: 0   ergocalciferol (VITAMIN D2) 1.25 MG (50000 UT) capsule, Take 50,000 Units by mouth once  a week., Disp: , Rfl:    escitalopram (LEXAPRO) 10 MG tablet, Take 10 mg by mouth daily., Disp: , Rfl:    fluticasone-salmeterol (ADVAIR HFA) 115-21 MCG/ACT inhaler, Inhale 2 puffs into the lungs 2 (two) times daily., Disp: 1 each, Rfl: 12   glimepiride (AMARYL) 4 MG tablet, Take 4 mg by mouth daily., Disp: , Rfl:    losartan (COZAAR) 50 MG tablet, Take 50 mg by mouth daily., Disp: , Rfl:    traZODone (DESYREL) 100 MG tablet, Take 100 mg by mouth at bedtime., Disp: , Rfl:    zolpidem (AMBIEN) 10 MG tablet, Take 1 tablet by mouth at bedtime., Disp: , Rfl:

## 2022-04-03 NOTE — Telephone Encounter (Signed)
Let's switch to dulera 100-44mcg 2 puffs twice daily as needed with spacer. Prescription sent to [facility].  Thanks, JD

## 2022-04-03 NOTE — Telephone Encounter (Signed)
[name] HFA order and she states that Advair diskus is preferred from the [place]. But she was wanting to know if you wanted the patient to to the [facility] with a spacer and is that what you preferred. She states that if that's the case then [facility] could be ordered for this patient.   Please advise Dr Francine Graven

## 2022-04-03 NOTE — Telephone Encounter (Signed)
ATC Mickel Baas at the [place], left detailed message regarding Dr. August Albino update.  Advised to return call with any questions.

## 2022-04-04 NOTE — Telephone Encounter (Signed)
ATC x2, reached a message each time stating that I could not be transferred to that extension.

## 2022-04-08 NOTE — Telephone Encounter (Signed)
Called the [place] and spoke with Mickel Baas and she said patient was switch to [facility] and that nothing further is needed. Will close this encounter.

## 2022-04-09 DIAGNOSIS — D860 Sarcoidosis of lung: Secondary | ICD-10-CM | POA: Diagnosis not present

## 2022-04-11 DIAGNOSIS — E118 Type 2 diabetes mellitus with unspecified complications: Secondary | ICD-10-CM | POA: Diagnosis not present

## 2022-04-22 ENCOUNTER — Ambulatory Visit (INDEPENDENT_AMBULATORY_CARE_PROVIDER_SITE_OTHER): Payer: No Typology Code available for payment source | Admitting: Primary Care

## 2022-04-22 ENCOUNTER — Encounter: Payer: Self-pay | Admitting: Primary Care

## 2022-04-22 VITALS — BP 120/70 | HR 88 | Temp 98.4°F | Ht 70.0 in | Wt 219.0 lb

## 2022-04-22 DIAGNOSIS — J4520 Mild intermittent asthma, uncomplicated: Secondary | ICD-10-CM

## 2022-04-22 DIAGNOSIS — R0602 Shortness of breath: Secondary | ICD-10-CM | POA: Diagnosis not present

## 2022-04-22 DIAGNOSIS — D869 Sarcoidosis, unspecified: Secondary | ICD-10-CM

## 2022-04-22 DIAGNOSIS — J45909 Unspecified asthma, uncomplicated: Secondary | ICD-10-CM | POA: Insufficient documentation

## 2022-04-22 MED ORDER — PREDNISONE 10 MG PO TABS: ORAL_TABLET | ORAL | 0 refills | Status: DC

## 2022-04-22 MED ORDER — PREDNISONE 10 MG PO TABS: ORAL_TABLET | ORAL | 0 refills | Status: AC

## 2022-04-22 NOTE — Assessment & Plan Note (Addendum)
-   Patient cough has not improved. He has associated dyspnea symptoms and developed a rash to forearms last week. CXR in June showed clear lungs without acute cardiopulmonary process. Checking ACE level today. Sending in prednisone taper 20mg  x 5 days, 15mg  x 5 days, 10mg x 5 days, 5mg  x 5 days. FU in August with Dr. Francine Graven or sooner if needed.

## 2022-04-23 DIAGNOSIS — H527 Unspecified disorder of refraction: Secondary | ICD-10-CM | POA: Diagnosis not present

## 2022-04-24 LAB — ANGIOTENSIN CONVERTING ENZYME: Angiotensin-Converting Enzyme: 16 U/L (ref 9–67)

## 2022-04-24 NOTE — Progress Notes (Signed)
ACE level was normal. CXR normal earlier in June. Not consistent with sarcoid flare. If symptoms do not improve after prednisone please let us know.

## 2022-04-29 ENCOUNTER — Telehealth: Payer: Self-pay | Admitting: Pulmonary Disease

## 2022-04-29 NOTE — Telephone Encounter (Signed)
ACE level was normal. CXR normal earlier in June. Not consistent with sarcoid flare. If symptoms do not improve after prednisone please let us know.    Patient is aware of results and voiced his understanding.  Nothing further needed.

## 2022-05-16 DIAGNOSIS — E119 Type 2 diabetes mellitus without complications: Secondary | ICD-10-CM | POA: Diagnosis not present

## 2022-05-16 DIAGNOSIS — D860 Sarcoidosis of lung: Secondary | ICD-10-CM | POA: Diagnosis not present

## 2022-05-16 DIAGNOSIS — R21 Rash and other nonspecific skin eruption: Secondary | ICD-10-CM | POA: Diagnosis not present

## 2022-05-16 DIAGNOSIS — T783XXD Angioneurotic edema, subsequent encounter: Secondary | ICD-10-CM | POA: Diagnosis not present

## 2022-05-16 DIAGNOSIS — Z7985 Long-term (current) use of injectable non-insulin antidiabetic drugs: Secondary | ICD-10-CM | POA: Diagnosis not present

## 2022-05-16 DIAGNOSIS — M545 Low back pain, unspecified: Secondary | ICD-10-CM | POA: Diagnosis not present

## 2022-05-16 DIAGNOSIS — Z7984 Long term (current) use of oral hypoglycemic drugs: Secondary | ICD-10-CM | POA: Diagnosis not present

## 2022-06-03 ENCOUNTER — Ambulatory Visit (INDEPENDENT_AMBULATORY_CARE_PROVIDER_SITE_OTHER): Payer: No Typology Code available for payment source | Admitting: Pulmonary Disease

## 2022-06-03 ENCOUNTER — Encounter: Payer: Self-pay | Admitting: Pulmonary Disease

## 2022-06-03 VITALS — BP 138/82 | HR 87 | Ht 70.5 in | Wt 218.4 lb

## 2022-06-03 DIAGNOSIS — D869 Sarcoidosis, unspecified: Secondary | ICD-10-CM | POA: Diagnosis not present

## 2022-06-03 DIAGNOSIS — J984 Other disorders of lung: Secondary | ICD-10-CM

## 2022-06-03 LAB — PULMONARY FUNCTION TEST
DL/VA % pred: 97 %
DL/VA: 4.08 ml/min/mmHg/L
DLCO cor % pred: 66 %
DLCO cor: 18.61 ml/min/mmHg
DLCO unc % pred: 66 %
DLCO unc: 18.61 ml/min/mmHg
FEF 25-75 Post: 3.34 L/sec
FEF 25-75 Pre: 3.28 L/sec
FEF2575-%Change-Post: 1 %
FEF2575-%Pred-Post: 112 %
FEF2575-%Pred-Pre: 110 %
FEV1-%Change-Post: 0 %
FEV1-%Pred-Post: 71 %
FEV1-%Pred-Pre: 71 %
FEV1-Post: 2.59 L
FEV1-Pre: 2.59 L
FEV1FVC-%Change-Post: 2 %
FEV1FVC-%Pred-Pre: 112 %
FEV6-%Change-Post: -2 %
FEV6-%Pred-Post: 64 %
FEV6-%Pred-Pre: 66 %
FEV6-Post: 2.98 L
FEV6-Pre: 3.05 L
FEV6FVC-%Change-Post: 0 %
FEV6FVC-%Pred-Post: 104 %
FEV6FVC-%Pred-Pre: 104 %
FVC-%Change-Post: -2 %
FVC-%Pred-Post: 61 %
FVC-%Pred-Pre: 63 %
FVC-Post: 2.98 L
FVC-Pre: 3.05 L
Post FEV1/FVC ratio: 87 %
Post FEV6/FVC ratio: 100 %
Pre FEV1/FVC ratio: 85 %
Pre FEV6/FVC Ratio: 100 %
RV % pred: 75 %
RV: 1.73 L
TLC % pred: 70 %
TLC: 5.01 L

## 2022-06-03 NOTE — Progress Notes (Unsigned)
Synopsis: Referred in June 2023 for Sarcoidosis from [place]  Subjective:   PATIENT ID: Gearldine Bienenstock GENDER: male DOB: 16-Dec-1960, MRN: [id number]  HPI  Chief Complaint  Patient presents with   Follow-up    F/U after PFT. States his SOB has improved but still has a dry cough.    Nuel Dejaynes is a 61 year old male, never smoker with history of DMII and hypertension who returns to pulmonary clinic for sarcoidosis and cough.   He was trialed on dulera 100-64mcg 2 puffs twice daily without relief of his cough. He was seen by Buelah Manis, NP 04/22/22 for on going cough. He was placed on extended steroid taper at that time for the cough. ACE level was checked and not elevated.   The steroids helped his shortness of breath but not the cough.  His breathing tests showed mild restrictive defect and mild diffusion defect.   Initial OV 04/03/22 He was diagnosed with sarcoidosis in 2010 and treated with steroids for 1 year then methotrexate for 2-3 years. He was previously monitored with PFTs. He had a swollen lymph node of his right neck removed in 2016 which showed evidence of sarcoidosis. He has bi-annual opathalmology visits and no evidence of inflammation noted 04/13/21. He has intermittent shortness of breath and cough. He has multiple awakenings at night due to dyspnea despite using CPAP regularly. He has follow up at [facility]. He is using albuterol inhaler as needed which does provided some relief.   He is retired. He worked in the [employer]. 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 Got married at 34, divorced at 36. Lives with second  wife x 31 years. His 3 children are grown and independent. 37, 30 and 24.  Past Medical History:  Diagnosis Date   Diabetes mellitus without complication (HCC)    Hypertension    Kidney disease    Sarcoidosis      No family history on file.   Social  History   Socioeconomic History   Marital status: Married    Spouse name: Not on file   Number of children: Not on file   Years of education: Not on file   Highest education level: Not on file  Occupational History   Not on file  Tobacco Use   Smoking status: Never   Smokeless tobacco: Never  Substance and Sexual Activity   Alcohol use: Not Currently   Drug use: Not Currently   Sexual activity: Not on file  Other Topics Concern   Not on file  Social History Narrative   Not on file   Social Determinants of Health   Financial Resource Strain: Not on file  Food Insecurity: Not on file  Transportation Needs: Not on file  Physical Activity: Not on file  Stress: Not on file  Social Connections: Not on file  Intimate Partner Violence: Not on file     Allergies  Allergen Reactions   Latex Swelling   Lisinopril Swelling   Sildenafil Palpitations    Other reaction(s): Headache, Palpitation, Headache, Palpitation, Headache, Palpitations     Outpatient Medications Prior to Visit  Medication Sig Dispense Refill   albuterol (VENTOLIN HFA) 108 (90 Base) MCG/ACT inhaler Inhale 1-2 puffs into the lungs as needed.     amoxicillin (AMOXIL) 500 MG capsule TAKE FOUR CAPSULES BY MOUTH ONCE 1 HOUR PRIOR TO THE DENTAL TREATMENT  aspirin EC 81 MG tablet Take 81 mg by mouth daily. Swallow whole.     atorvastatin (LIPITOR) 40 MG tablet Take 20 mg by mouth at bedtime.     clobetasol ointment (TEMOVATE) 0.05 % Apply 1 application topically 2 (two) times daily.     diazepam (VALIUM) 5 MG tablet Take 5 mg by mouth daily as needed for anxiety.     Dulaglutide 1.5 MG/0.5ML SOPN Inject 1.5 mg into the skin once a week.     EPINEPHrine 0.3 mg/0.3 mL IJ SOAJ injection Inject 0.3 mg into the muscle as needed for anaphylaxis. 1 each 0   ergocalciferol (VITAMIN D2) 1.25 MG (50000 UT) capsule Take 50,000 Units by mouth once a week.     escitalopram (LEXAPRO) 10 MG tablet Take 10 mg by mouth daily.      glimepiride (AMARYL) 4 MG tablet Take 4 mg by mouth daily.     losartan (COZAAR) 50 MG tablet Take 50 mg by mouth daily.     mometasone-formoterol (DULERA) 100-5 MCG/ACT AERO Inhale 2 puffs into the lungs 2 (two) times daily as needed for wheezing. 1 each 0   traZODone (DESYREL) 100 MG tablet Take 100 mg by mouth at bedtime.     zolpidem (AMBIEN) 10 MG tablet Take 1 tablet by mouth at bedtime.     No facility-administered medications prior to visit.   Review of Systems  Constitutional:  Negative for chills, fever, malaise/fatigue and weight loss.  HENT:  Negative for congestion, sinus pain and sore throat.   Eyes: Negative.   Respiratory:  Positive for shortness of breath. Negative for cough, hemoptysis, sputum production and wheezing.   Cardiovascular:  Negative for chest pain, palpitations, orthopnea, claudication and leg swelling.  Gastrointestinal:  Negative for abdominal pain, heartburn, nausea and vomiting.  Genitourinary: Negative.   Musculoskeletal:  Positive for joint pain. Negative for myalgias.  Skin:  Negative for rash.  Neurological:  Negative for weakness.  Endo/Heme/Allergies: Negative.   Psychiatric/Behavioral:  Positive for depression. The patient is nervous/anxious.    Objective:   Vitals:   06/03/22 1051  BP: 138/82  Pulse: 87  SpO2: 98%  Weight: 218 lb 6.4 oz (99.1 kg)  Height: 5' 10.5" (1.791 m)   Physical Exam Constitutional:      General: He is not in acute distress. HENT:     Head: Normocephalic and atraumatic.  Eyes:     Extraocular Movements: Extraocular movements intact.     Conjunctiva/sclera: Conjunctivae normal.     Pupils: Pupils are equal, round, and reactive to light.  Cardiovascular:     Rate and Rhythm: Normal rate and regular rhythm.     Pulses: Normal pulses.     Heart sounds: Normal heart sounds. No murmur heard. Pulmonary:     Effort: Pulmonary effort is normal.     Breath sounds: Normal breath sounds.  Abdominal:     General:  Bowel sounds are normal.     Palpations: Abdomen is soft.  Musculoskeletal:     Right lower leg: No edema.     Left lower leg: No edema.  Lymphadenopathy:     Cervical: No cervical adenopathy.  Skin:    General: Skin is warm and dry.  Neurological:     General: No focal deficit present.     Mental Status: He is alert.  Psychiatric:        Mood and Affect: Mood normal.        Behavior: Behavior normal.  Thought Content: Thought content normal.        Judgment: Judgment normal.    CBC    Component Value Date/Time   WBC 5.5 01/01/2021 1248   RBC 4.77 01/01/2021 1248   HGB 14.2 01/01/2021 1248   HCT 43.7 01/01/2021 1248   PLT 181 01/01/2021 1248   MCV 91.6 01/01/2021 1248   MCH 29.8 01/01/2021 1248   MCHC 32.5 01/01/2021 1248   RDW 13.6 01/01/2021 1248   LYMPHSABS 1.6 01/01/2021 1248   MONOABS 0.6 01/01/2021 1248   EOSABS 0.2 01/01/2021 1248   BASOSABS 0.1 01/01/2021 1248      Latest Ref Rng & Units 01/01/2021   12:48 PM 08/24/2020   11:40 AM 04/03/2008   11:04 PM  BMP  Glucose 70 - 99 mg/dL 99  829  937   BUN 6 - 20 mg/dL 16  14  13    Creatinine 0.61 - 1.24 mg/dL  1.69  1.5   Sodium 135 - 145 mmol/L 138  135  134   Potassium 3.5 - 5.1 mmol/L 4.1  3.9  3.5   Chloride 98 - 111 mmol/L 104  98  100   CO2 22 - 32 mmol/L 27  26    Calcium 8.9 - 10.3 mg/dL 9.6  9.2     Chest imaging:  PFT:    Latest Ref Rng & Units 06/03/2022   10:04 AM  PFT Results  FVC-Pre L 3.05   FVC-Predicted Pre % 63   FVC-Post L 2.98   FVC-Predicted Post % 61   Pre FEV1/FVC % % 85   Post FEV1/FCV % % 87   FEV1-Pre L 2.59   FEV1-Predicted Pre % 71   FEV1-Post L 2.59   DLCO uncorrected ml/min/mmHg 18.61   DLCO UNC% % 66   DLCO corrected ml/min/mmHg 18.61   DLCO COR %Predicted % 66   DLVA Predicted % 97   TLC L 5.01   TLC % Predicted % 70   RV % Predicted % 75     Labs:  Path:  Echo 01/08/21: LVEF 50-55%. Mild concentric hypertrophy. Patent foramen ovale. RV is normal.    Heart Catheterization:  Lower Extremtiy 01/10/21 Bilaterally 01/23/21 Negative for DVT bilaterally  EKG 01/02/21 Sinus rhythm. QRS 93, Qtc 446, PR 158.   Assessment & Plan:   Sarcoidosis - Plan: CT CHEST HIGH RESOLUTION  Restrictive lung disease - Plan: CT CHEST HIGH RESOLUTION  Discussion: Nicholaos Schippers is a 61 year old male, never smoker with history of DMII and hypertension who returns to pulmonary clinic for sarcoidosis.   We will check HRCT Chest given his restrictive defect and diffusion defect on PFTs today.   His dry cough did not respond to ICS/LABA inhaler therapy.   He will need updated EKG in the future.  Follow up in 3 months.  77, MD [facility] Office: [phone]   Current Outpatient Medications:    albuterol (VENTOLIN HFA) 108 (90 Base) MCG/ACT inhaler, Inhale 1-2 puffs into the lungs as needed., Disp: , Rfl:    amoxicillin (AMOXIL) 500 MG capsule, TAKE FOUR CAPSULES BY MOUTH ONCE 1 HOUR PRIOR TO THE DENTAL TREATMENT, Disp: , Rfl:    aspirin EC 81 MG tablet, Take 81 mg by mouth daily. Swallow whole., Disp: , Rfl:    atorvastatin (LIPITOR) 40 MG tablet, Take 20 mg by mouth at bedtime., Disp: , Rfl:    clobetasol ointment (TEMOVATE) 0.05 %, Apply 1 application topically 2 (  two) times daily., Disp: , Rfl:    diazepam (VALIUM) 5 MG tablet, Take 5 mg by mouth daily as needed for anxiety., Disp: , Rfl:    Dulaglutide 1.5 MG/0.5ML SOPN, Inject 1.5 mg into the skin once a week., Disp: , Rfl:    EPINEPHrine 0.3 mg/0.3 mL IJ SOAJ injection, Inject 0.3 mg into the muscle as needed for anaphylaxis., Disp: 1 each, Rfl: 0   ergocalciferol (VITAMIN D2) 1.25 MG (50000 UT) capsule, Take 50,000 Units by mouth once a week., Disp: , Rfl:    escitalopram (LEXAPRO) 10 MG tablet, Take 10 mg by mouth daily., Disp: , Rfl:    glimepiride (AMARYL) 4 MG tablet, Take 4 mg by mouth daily., Disp: , Rfl:    losartan (COZAAR) 50 MG tablet, Take 50 mg by  mouth daily., Disp: , Rfl:    mometasone-formoterol (DULERA) 100-5 MCG/ACT AERO, Inhale 2 puffs into the lungs 2 (two) times daily as needed for wheezing., Disp: 1 each, Rfl: 0   traZODone (DESYREL) 100 MG tablet, Take 100 mg by mouth at bedtime., Disp: , Rfl:    zolpidem (AMBIEN) 10 MG tablet, Take 1 tablet by mouth at bedtime., Disp: , Rfl:

## 2022-06-03 NOTE — Progress Notes (Signed)
PFT done today. 

## 2022-06-03 NOTE — Patient Instructions (Addendum)
Your breathing tests show mild restrictive defect (the lungs are stiffer than normal) and a mild diffusion defect (oxygen has a harder time traveling across the lung tissue)  We will check a high resolution CT Chest scan to further evaluate the abnormalities on the breathing tests  We will call you with the results and the next steps from there.  Follow up in 3 months.

## 2022-06-05 ENCOUNTER — Encounter: Payer: Self-pay | Admitting: Pulmonary Disease

## 2022-06-14 ENCOUNTER — Ambulatory Visit (HOSPITAL_COMMUNITY)
Admission: RE | Admit: 2022-06-14 | Discharge: 2022-06-14 | Disposition: A | Discharge status: Home / Self Care | Payer: No Typology Code available for payment source | Source: Ambulatory Visit | Attending: Pulmonary Disease | Admitting: Pulmonary Disease

## 2022-06-14 DIAGNOSIS — J984 Other disorders of lung: Secondary | ICD-10-CM | POA: Diagnosis present

## 2022-06-14 DIAGNOSIS — D869 Sarcoidosis, unspecified: Secondary | ICD-10-CM | POA: Insufficient documentation

## 2022-06-20 ENCOUNTER — Other Ambulatory Visit: Payer: Self-pay

## 2022-06-20 ENCOUNTER — Telehealth: Payer: Self-pay | Admitting: Pulmonary Disease

## 2022-06-20 DIAGNOSIS — K036 Deposits [accretions] on teeth: Secondary | ICD-10-CM | POA: Diagnosis not present

## 2022-06-20 DIAGNOSIS — I2510 Atherosclerotic heart disease of native coronary artery without angina pectoris: Secondary | ICD-10-CM

## 2022-06-20 DIAGNOSIS — K08409 Partial loss of teeth, unspecified cause, unspecified class: Secondary | ICD-10-CM | POA: Diagnosis not present

## 2022-06-20 DIAGNOSIS — K0251 Dental caries on pit and fissure surface limited to enamel: Secondary | ICD-10-CM | POA: Diagnosis not present

## 2022-06-20 NOTE — Telephone Encounter (Signed)
Printed off office note and CT report and faxed a copy to the [place] as patient requested. And also mailed him a copy of the office note and CT report as requested. Nothing further needed

## 2022-06-21 ENCOUNTER — Telehealth: Payer: Self-pay | Admitting: Pulmonary Disease

## 2022-06-21 NOTE — Telephone Encounter (Signed)
Called and spoke with patient. Advised patient that I would print off office notes and the report of his CT scan. Office notes and CT scan report has been printed and taken to the front desk for patient to pick up.   Nothing further needed.

## 2022-06-21 NOTE — Telephone Encounter (Signed)
Brendan [name] from [place] is returning phone call. Brendan [name] phone number is [phone]. Fax number is [phone].

## 2022-06-21 NOTE — Telephone Encounter (Signed)
Spoke with cory from [facility]. He stated that he was going [facility] and [facility] that [facility]. [facility] was provided to cory.  Nothing further needed.

## 2022-06-21 NOTE — Telephone Encounter (Signed)
ATC patient. Could not leave vm due to the mailbox being full. Will try to call again.

## 2022-07-04 DIAGNOSIS — K08409 Partial loss of teeth, unspecified cause, unspecified class: Secondary | ICD-10-CM | POA: Diagnosis not present

## 2022-07-05 DIAGNOSIS — K08409 Partial loss of teeth, unspecified cause, unspecified class: Secondary | ICD-10-CM | POA: Diagnosis not present

## 2022-07-11 NOTE — Progress Notes (Unsigned)
Cardiology Office Note:    Date:  07/15/2022   ID:  Brendan [name], DOB [birth year], MRN [id number]  PCP:  Default, Provider, MD   [facility] Providers Cardiologist:  None     Referring MD: Brendan Sinner, MD   Chief Complaint  Patient presents with   Coronary Artery Disease    History of Present Illness:    Brendan [name] is a 61 y.o. male seen at the request of Brendan [name] for evaluation of coronary and aortic calcification noted on CT. He has a history of Sarcoidosis, DM, HLD, and OSA on CPAP. He did have an Echo in March 2022 at [facility] showing EF 50-55%. Mild LVH. Mild diastolic dysfunction. There was an interatrial septal aneurysm with shunt (PFO) but agitated saline. Mild MR. Cranial MRI at that time showed 2 old lacunar infarcts and a small old cortical infarct in the posterior inferior right occipital lobe. CTA of head and neck showed [name] significant stenosis. He was placed on ASA by Neurology then.  He does note his activity has been limited since his bilateral TKR. He does have some SOB on exertion that he attributes to his sarcoidosis. His primary care (Brendan [name]) in [place] follows his lab work. [name] prior stress test, MI or CHF. [name] syncope.   Past Medical History:  Diagnosis Date   Diabetes mellitus without complication (HCC)    Hyperlipidemia    Hypertension    Kidney disease    Sarcoidosis    Stroke [facility])     Past Surgical History:  Procedure Laterality Date   JOINT REPLACEMENT Bilateral    TKR   ROTATOR CUFF REPAIR Left     Current Medications: Current Meds  Medication Sig   albuterol (VENTOLIN HFA) 108 (90 Base) MCG/ACT inhaler Inhale 1-2 puffs into the lungs as needed.   aspirin EC 81 MG tablet Take 81 mg by mouth daily. Swallow whole.   atorvastatin (LIPITOR) 40 MG tablet Take 20 mg by mouth at bedtime.   diazepam (VALIUM) 5 MG tablet Take 5 mg by mouth daily as needed for anxiety.   Dulaglutide 1.5 MG/0.5ML SOPN Inject  1.5 mg into the skin once a week.   escitalopram (LEXAPRO) 10 MG tablet Take 10 mg by mouth daily.   glimepiride (AMARYL) 4 MG tablet Take 4 mg by mouth daily.   losartan (COZAAR) 50 MG tablet Take 50 mg by mouth daily.   mometasone-formoterol (DULERA) 100-5 MCG/ACT AERO Inhale 2 puffs into the lungs 2 (two) times daily as needed for wheezing.   traZODone (DESYREL) 100 MG tablet Take 100 mg by mouth at bedtime.   zolpidem (AMBIEN) 10 MG tablet Take 1 tablet by mouth at bedtime.     Allergies:   Latex, Lisinopril, and Sildenafil   Social History   Socioeconomic History   Marital status: Married    Spouse name: Not on file   Number of children: 3   Years of education: Not on file   Highest education level: Not on file  Occupational History   Not on file  Tobacco Use   Smoking status: Never   Smokeless tobacco: Never  Substance and Sexual Activity   Alcohol use: Not Currently   Drug use: Not Currently   Sexual activity: Not on file  Other Topics Concern   Not on file  Social History Narrative   Retired Art gallery manager for [employer]   Social Determinants of Corporate investment banker Strain: Not on [employer]  Insecurity: Not on file  Transportation Needs: Not on file  Physical Activity: Not on file  Stress: Not on file  Social Connections: Not on file     Family History: The patient's family history includes Heart failure (age of onset: 25) in his mother.  ROS:   Please see the history of present illness.     All other systems reviewed and are negative.  EKGs/Labs/Other Studies Reviewed:    The following studies were reviewed today: CLINICAL DATA:  Sarcoidosis. Restrictive lung disease. Evaluate for interstitial lung disease.   EXAM: CT CHEST WITHOUT CONTRAST   TECHNIQUE: Multidetector CT imaging of the chest was performed following the standard protocol without intravenous contrast. High resolution imaging of the lungs, as well as inspiratory and expiratory  imaging, was performed.   RADIATION DOSE REDUCTION: This exam was performed according to the departmental dose-optimization program which includes automated exposure control, adjustment of the mA and/or kV according to patient size and/or use of iterative reconstruction technique.   COMPARISON:  04/03/2022 chest radiograph.   FINDINGS: Cardiovascular: Normal heart size. [name] significant pericardial effusion/thickening. Three-vessel coronary atherosclerosis. Mildly atherosclerotic nonaneurysmal thoracic aorta. Normal caliber pulmonary arteries.   Mediastinum/Nodes: [name] discrete thyroid nodules. Unremarkable esophagus. [name] axillary adenopathy. Shotty nonenlarged paratracheal and prevascular mediastinal nodes. [name] pathologically enlarged mediastinal or discrete hilar nodes on these noncontrast images.   Lungs/Pleura: [name] pneumothorax. [name] pleural effusion. Several scattered small solid peribronchovascular the perifissural and subpleural pulmonary nodules in both lungs, for example a 0.5 cm right upper lobe peribronchovascular nodule (series 6/image 65) and a 0.5 cm left major fissure nodule (series 6/image 69). [name] acute consolidative airspace disease or lung masses. [name] significant regions of subpleural reticulation, ground-glass opacity, traction bronchiectasis, architectural distortion or frank honeycombing. [name] significant lobular air trapping or evidence of tracheobronchomalacia on the expiration sequence.   Upper abdomen: Subcentimeter posterior right liver dome hypodense lesion is too small to characterize.   Musculoskeletal: [name] aggressive appearing focal osseous lesions. Mild thoracic spondylosis.   IMPRESSION: 1. Scattered perilymphatic distribution small solid pulmonary nodules, largest 0.5 cm, compatible with pulmonary sarcoidosis. 2. [name] evidence of interstitial lung disease. 3. Three-vessel coronary atherosclerosis. 4. Aortic Atherosclerosis (ICD10-I70.0).      Electronically Signed   By: Brendan [name] M.D.   On: 06/17/2022 15:38  EKG:  EKG is  ordered today.  The ekg ordered today demonstrates NSR rate 79. T wave abnormality c/w lateral ischemia. LAD. I have personally reviewed and interpreted this study.   Recent Labs: [name] results found for requested labs within last 365 days.  Recent Lipid Panel [name] results found for: "CHOL", "TRIG", "HDL", "CHOLHDL", "VLDL", "LDLCALC", "LDLDIRECT"   Risk Assessment/Calculations:                Physical Exam:    VS:  BP 138/80   Pulse 79   Ht 5\' 10"  (1.778 m)   Wt 217 lb 3.2 oz (98.5 kg)   SpO2 97%   BMI 31.16 kg/m     Wt Readings from Last 3 Encounters:  07/15/22 217 lb 3.2 oz (98.5 kg)  06/03/22 218 lb 6.4 oz (99.1 kg)  04/22/22 219 lb (99.3 kg)     GEN:  Well nourished, well developed in [name] acute distress HEENT: Normal NECK: [name] JVD; [name] carotid bruits LYMPHATICS: [name] lymphadenopathy CARDIAC: RRR, [name] murmurs, rubs, gallops RESPIRATORY:  Clear to auscultation without rales, wheezing or rhonchi  ABDOMEN: Soft, non-tender, non-distended MUSCULOSKELETAL:  [name] edema; [name] deformity  SKIN: Warm and  dry NEUROLOGIC:  Alert and oriented x 3 PSYCHIATRIC:  Normal affect   ASSESSMENT:    1. Coronary artery calcification   2. Type 2 diabetes mellitus without complication, without long-term current use of insulin (HCC)   3. Primary hypertension   4. Hyperlipidemia, unspecified hyperlipidemia type   5. Dyspnea on exertion    PLAN:    In order of problems listed above:  Coronary artery calcification. Multiple coronary risk factors. He does have some DOE which could be anginal equivalent. [name] chest pain. Recommend stress myoview to further evaluate ischemic risk. If low risk would continue risk factor modification. If high risk would need to consider cardiac cath DM on oral therapy HLD. On statin. Request copy of lab work from PCP HTN. Controlled on losartan OSA Sarcoidosis. Per pulmonary. [name]  overt evidence of cardiac sarcoid.  History of CVA on ASA     Shared Decision Making/Informed Consent The risks [chest pain, shortness of breath, cardiac arrhythmias, dizziness, blood pressure fluctuations, myocardial infarction, stroke/transient ischemic attack, nausea, vomiting, allergic reaction, radiation exposure, metallic taste sensation and life-threatening complications (estimated to be 1 in 10,000)], benefits (risk stratification, diagnosing coronary artery disease, treatment guidance) and alternatives of a nuclear stress test were discussed in detail with Mr. Sotelo and he agrees to proceed.    Medication Adjustments/Labs and Tests Ordered: Current medicines are reviewed at length with the patient today.  Concerns regarding medicines are outlined above.  Orders Placed This Encounter  Procedures   Cardiac Stress Test: Informed Consent Details: Physician/Practitioner Attestation; Transcribe to consent form and obtain patient signature   EKG 12-Lead   Meds ordered this encounter  Medications   amoxicillin (AMOXIL) 500 MG capsule    Sig: TAKE FOUR CAPSULES BY MOUTH ONCE 1 HOUR PRIOR TO THE DENTAL TREATMENT    Dispense:  4 capsule    Refill:  3    There are [name] Patient Instructions on file for this visit.   Signed, Quetzally Callas [place], MD  07/15/2022 9:06 AM    [facility]

## 2022-07-11 NOTE — H&P (View-Only) (Signed)
Cardiology Office Note:    Date:  07/15/2022   ID:  Gearldine Bienenstock, DOB 06-Nov-1960, MRN [id number]  PCP:  Default, Provider, MD   [facility] Providers Cardiologist:  None     Referring MD: Martina Sinner, MD   Chief Complaint  Patient presents with   Coronary Artery Disease    History of Present Illness:    Brendan [name] is a 61 y.o. male seen at the request of Dr Francine Graven for evaluation of coronary and aortic calcification noted on CT. He has a history of Sarcoidosis, DM, HLD, and OSA on CPAP. He did have an Echo in March 2022 at [facility] showing EF 50-55%. Mild LVH. Mild diastolic dysfunction. There was an interatrial septal aneurysm with shunt (PFO) but agitated saline. Mild MR. Cranial MRI at that time showed 2 old lacunar infarcts and a small old cortical infarct in the posterior inferior right occipital lobe. CTA of head and neck showed no significant stenosis. He was placed on ASA by Neurology then.  He does note his activity has been limited since his bilateral TKR. He does have some SOB on exertion that he attributes to his sarcoidosis. His primary care (Dr Francene Castle) in [place] follows his lab work. No prior stress test, MI or CHF. No syncope.   Past Medical History:  Diagnosis Date   Diabetes mellitus without complication (HCC)    Hyperlipidemia    Hypertension    Kidney disease    Sarcoidosis    Stroke [facility])     Past Surgical History:  Procedure Laterality Date   JOINT REPLACEMENT Bilateral    TKR   ROTATOR CUFF REPAIR Left     Current Medications: Current Meds  Medication Sig   albuterol (VENTOLIN HFA) 108 (90 Base) MCG/ACT inhaler Inhale 1-2 puffs into the lungs as needed.   aspirin EC 81 MG tablet Take 81 mg by mouth daily. Swallow whole.   atorvastatin (LIPITOR) 40 MG tablet Take 20 mg by mouth at bedtime.   diazepam (VALIUM) 5 MG tablet Take 5 mg by mouth daily as needed for anxiety.   Dulaglutide 1.5 MG/0.5ML SOPN Inject  1.5 mg into the skin once a week.   escitalopram (LEXAPRO) 10 MG tablet Take 10 mg by mouth daily.   glimepiride (AMARYL) 4 MG tablet Take 4 mg by mouth daily.   losartan (COZAAR) 50 MG tablet Take 50 mg by mouth daily.   mometasone-formoterol (DULERA) 100-5 MCG/ACT AERO Inhale 2 puffs into the lungs 2 (two) times daily as needed for wheezing.   traZODone (DESYREL) 100 MG tablet Take 100 mg by mouth at bedtime.   zolpidem (AMBIEN) 10 MG tablet Take 1 tablet by mouth at bedtime.     Allergies:   Latex, Lisinopril, and Sildenafil   Social History   Socioeconomic History   Marital status: Married    Spouse name: Not on file   Number of children: 3   Years of education: Not on file   Highest education level: Not on file  Occupational History   Not on file  Tobacco Use   Smoking status: Never   Smokeless tobacco: Never  Substance and Sexual Activity   Alcohol use: Not Currently   Drug use: Not Currently   Sexual activity: Not on file  Other Topics Concern   Not on file  Social History Narrative   Retired Art gallery manager for [employer]   Social Determinants of Corporate investment banker Strain: Not on [employer]  Insecurity: Not on file  Transportation Needs: Not on file  Physical Activity: Not on file  Stress: Not on file  Social Connections: Not on file     Family History: The patient's family history includes Heart failure (age of onset: 25) in his mother.  ROS:   Please see the history of present illness.     All other systems reviewed and are negative.  EKGs/Labs/Other Studies Reviewed:    The following studies were reviewed today: CLINICAL DATA:  Sarcoidosis. Restrictive lung disease. Evaluate for interstitial lung disease.   EXAM: CT CHEST WITHOUT CONTRAST   TECHNIQUE: Multidetector CT imaging of the chest was performed following the standard protocol without intravenous contrast. High resolution imaging of the lungs, as well as inspiratory and expiratory  imaging, was performed.   RADIATION DOSE REDUCTION: This exam was performed according to the departmental dose-optimization program which includes automated exposure control, adjustment of the mA and/or kV according to patient size and/or use of iterative reconstruction technique.   COMPARISON:  04/03/2022 chest radiograph.   FINDINGS: Cardiovascular: Normal heart size. No significant pericardial effusion/thickening. Three-vessel coronary atherosclerosis. Mildly atherosclerotic nonaneurysmal thoracic aorta. Normal caliber pulmonary arteries.   Mediastinum/Nodes: No discrete thyroid nodules. Unremarkable esophagus. No axillary adenopathy. Shotty nonenlarged paratracheal and prevascular mediastinal nodes. No pathologically enlarged mediastinal or discrete hilar nodes on these noncontrast images.   Lungs/Pleura: No pneumothorax. No pleural effusion. Several scattered small solid peribronchovascular the perifissural and subpleural pulmonary nodules in both lungs, for example a 0.5 cm right upper lobe peribronchovascular nodule (series 6/image 65) and a 0.5 cm left major fissure nodule (series 6/image 69). No acute consolidative airspace disease or lung masses. No significant regions of subpleural reticulation, ground-glass opacity, traction bronchiectasis, architectural distortion or frank honeycombing. No significant lobular air trapping or evidence of tracheobronchomalacia on the expiration sequence.   Upper abdomen: Subcentimeter posterior right liver dome hypodense lesion is too small to characterize.   Musculoskeletal: No aggressive appearing focal osseous lesions. Mild thoracic spondylosis.   IMPRESSION: 1. Scattered perilymphatic distribution small solid pulmonary nodules, largest 0.5 cm, compatible with pulmonary sarcoidosis. 2. No evidence of interstitial lung disease. 3. Three-vessel coronary atherosclerosis. 4. Aortic Atherosclerosis (ICD10-I70.0).      Electronically Signed   By: Delbert Phenix M.D.   On: 06/17/2022 15:38  EKG:  EKG is  ordered today.  The ekg ordered today demonstrates NSR rate 79. T wave abnormality c/w lateral ischemia. LAD. I have personally reviewed and interpreted this study.   Recent Labs: No results found for requested labs within last 365 days.  Recent Lipid Panel No results found for: "CHOL", "TRIG", "HDL", "CHOLHDL", "VLDL", "LDLCALC", "LDLDIRECT"   Risk Assessment/Calculations:                Physical Exam:    VS:  BP 138/80   Pulse 79   Ht 5\' 10"  (1.778 m)   Wt 217 lb 3.2 oz (98.5 kg)   SpO2 97%   BMI 31.16 kg/m     Wt Readings from Last 3 Encounters:  07/15/22 217 lb 3.2 oz (98.5 kg)  06/03/22 218 lb 6.4 oz (99.1 kg)  04/22/22 219 lb (99.3 kg)     GEN:  Well nourished, well developed in no acute distress HEENT: Normal NECK: No JVD; No carotid bruits LYMPHATICS: No lymphadenopathy CARDIAC: RRR, no murmurs, rubs, gallops RESPIRATORY:  Clear to auscultation without rales, wheezing or rhonchi  ABDOMEN: Soft, non-tender, non-distended MUSCULOSKELETAL:  No edema; No deformity  SKIN: Warm and  dry NEUROLOGIC:  Alert and oriented x 3 PSYCHIATRIC:  Normal affect   ASSESSMENT:    1. Coronary artery calcification   2. Type 2 diabetes mellitus without complication, without long-term current use of insulin (HCC)   3. Primary hypertension   4. Hyperlipidemia, unspecified hyperlipidemia type   5. Dyspnea on exertion    PLAN:    In order of problems listed above:  Coronary artery calcification. Multiple coronary risk factors. He does have some DOE which could be anginal equivalent. No chest pain. Recommend stress myoview to further evaluate ischemic risk. If low risk would continue risk factor modification. If high risk would need to consider cardiac cath DM on oral therapy HLD. On statin. Request copy of lab work from PCP HTN. Controlled on losartan OSA Sarcoidosis. Per pulmonary. No  overt evidence of cardiac sarcoid.  History of CVA on ASA     Shared Decision Making/Informed Consent The risks [chest pain, shortness of breath, cardiac arrhythmias, dizziness, blood pressure fluctuations, myocardial infarction, stroke/transient ischemic attack, nausea, vomiting, allergic reaction, radiation exposure, metallic taste sensation and life-threatening complications (estimated to be 1 in 10,000)], benefits (risk stratification, diagnosing coronary artery disease, treatment guidance) and alternatives of a nuclear stress test were discussed in detail with Mr. Sotelo and he agrees to proceed.    Medication Adjustments/Labs and Tests Ordered: Current medicines are reviewed at length with the patient today.  Concerns regarding medicines are outlined above.  Orders Placed This Encounter  Procedures   Cardiac Stress Test: Informed Consent Details: Physician/Practitioner Attestation; Transcribe to consent form and obtain patient signature   EKG 12-Lead   Meds ordered this encounter  Medications   amoxicillin (AMOXIL) 500 MG capsule    Sig: TAKE FOUR CAPSULES BY MOUTH ONCE 1 HOUR PRIOR TO THE DENTAL TREATMENT    Dispense:  4 capsule    Refill:  3    There are no Patient Instructions on file for this visit.   Signed, Iman Reinertsen [place], MD  07/15/2022 9:06 AM    [facility]

## 2022-07-15 ENCOUNTER — Encounter: Payer: Self-pay | Admitting: Cardiology

## 2022-07-15 ENCOUNTER — Ambulatory Visit: Payer: No Typology Code available for payment source | Attending: Cardiology | Admitting: Cardiology

## 2022-07-15 VITALS — BP 138/80 | HR 79 | Ht 70.0 in | Wt 217.2 lb

## 2022-07-15 DIAGNOSIS — E785 Hyperlipidemia, unspecified: Secondary | ICD-10-CM | POA: Diagnosis not present

## 2022-07-15 DIAGNOSIS — I2510 Atherosclerotic heart disease of native coronary artery without angina pectoris: Secondary | ICD-10-CM | POA: Diagnosis not present

## 2022-07-15 DIAGNOSIS — I2584 Coronary atherosclerosis due to calcified coronary lesion: Secondary | ICD-10-CM | POA: Diagnosis not present

## 2022-07-15 DIAGNOSIS — I10 Essential (primary) hypertension: Secondary | ICD-10-CM

## 2022-07-15 DIAGNOSIS — E119 Type 2 diabetes mellitus without complications: Secondary | ICD-10-CM

## 2022-07-15 DIAGNOSIS — R0609 Other forms of dyspnea: Secondary | ICD-10-CM

## 2022-07-15 MED ORDER — AMOXICILLIN 500 MG PO CAPS: ORAL_CAPSULE | ORAL | 3 refills | Status: AC

## 2022-07-15 NOTE — Patient Instructions (Signed)
Medication Instructions:  Continue all medications   Lab Work: None ordered   Testing/Procedures: Stress Myoview   Follow-Up: At [facility], you and your health needs are our priority.  As part of our continuing mission to provide you with exceptional heart care, we have created designated Provider Care Teams.  These Care Teams include your primary Cardiologist (physician) and Advanced Practice Providers (APPs -  Physician Assistants and Nurse Practitioners) who all work together to provide you with the care you need, when you need it.  We recommend signing up for the patient portal called "MyChart".  Sign up information is provided on this After Visit Summary.  MyChart is used to connect with patients for Virtual Visits (Telemedicine).  Patients are able to view lab/test results, encounter notes, upcoming appointments, etc.  Non-urgent messages can be sent to your provider as well.   To learn more about what you can do with MyChart, go to [email or web address].    Your next appointment:  After Test      The format for your next appointment: Office     Provider:  Dr.Jordan   Important Information About Sugar

## 2022-07-18 DIAGNOSIS — E785 Hyperlipidemia, unspecified: Secondary | ICD-10-CM | POA: Diagnosis not present

## 2022-07-18 DIAGNOSIS — E119 Type 2 diabetes mellitus without complications: Secondary | ICD-10-CM | POA: Diagnosis not present

## 2022-07-18 DIAGNOSIS — D869 Sarcoidosis, unspecified: Secondary | ICD-10-CM | POA: Diagnosis not present

## 2022-07-24 ENCOUNTER — Telehealth (HOSPITAL_COMMUNITY): Payer: Self-pay | Admitting: *Deleted

## 2022-07-24 NOTE — Telephone Encounter (Signed)
Close encounter 

## 2022-07-25 ENCOUNTER — Ambulatory Visit (HOSPITAL_COMMUNITY)
Admission: RE | Admit: 2022-07-25 | Discharge: 2022-07-25 | Disposition: A | Discharge status: Home / Self Care | Payer: No Typology Code available for payment source | Source: Ambulatory Visit | Attending: Cardiovascular Disease | Admitting: Cardiovascular Disease

## 2022-07-25 DIAGNOSIS — I10 Essential (primary) hypertension: Secondary | ICD-10-CM | POA: Insufficient documentation

## 2022-07-25 DIAGNOSIS — I2584 Coronary atherosclerosis due to calcified coronary lesion: Secondary | ICD-10-CM | POA: Diagnosis present

## 2022-07-25 DIAGNOSIS — E785 Hyperlipidemia, unspecified: Secondary | ICD-10-CM | POA: Diagnosis not present

## 2022-07-25 DIAGNOSIS — R0609 Other forms of dyspnea: Secondary | ICD-10-CM | POA: Insufficient documentation

## 2022-07-25 DIAGNOSIS — I2510 Atherosclerotic heart disease of native coronary artery without angina pectoris: Secondary | ICD-10-CM | POA: Diagnosis not present

## 2022-07-25 DIAGNOSIS — E119 Type 2 diabetes mellitus without complications: Secondary | ICD-10-CM | POA: Insufficient documentation

## 2022-07-25 LAB — MYOCARDIAL PERFUSION IMAGING
Angina Index: 0
Duke Treadmill Score: 8
Estimated workload: 9.3
Exercise duration (min): 7 min
Exercise duration (sec): 31 s
LV dias vol: 144 mL (ref 62–150)
LV sys vol: 97 mL
MPHR: 159 {beats}/min
Nuc Stress EF: 33 %
Peak HR: 146 {beats}/min
Percent HR: 91 %
Rest HR: 88 {beats}/min
Rest Nuclear Isotope Dose: 10.2 mCi
SDS: 0
SRS: 1
SSS: 1
ST Depression (mm): 0 mm
Stress Nuclear Isotope Dose: 30.9 mCi
TID: 1

## 2022-07-25 MED ORDER — TECHNETIUM TC 99M TETROFOSMIN IV KIT
10.2000 | PACK | Freq: Once | INTRAVENOUS | Status: AC | PRN
  Administered 2022-07-25: 10.2 via INTRAVENOUS

## 2022-07-25 MED ORDER — TECHNETIUM TC 99M TETROFOSMIN IV KIT
30.9000 | PACK | Freq: Once | INTRAVENOUS | Status: AC | PRN
  Administered 2022-07-25: 30.9 via INTRAVENOUS

## 2022-07-29 ENCOUNTER — Other Ambulatory Visit: Payer: Self-pay

## 2022-07-29 ENCOUNTER — Other Ambulatory Visit: Payer: Self-pay | Admitting: Cardiology

## 2022-07-29 ENCOUNTER — Telehealth: Payer: Self-pay

## 2022-07-29 DIAGNOSIS — R0609 Other forms of dyspnea: Secondary | ICD-10-CM

## 2022-07-29 DIAGNOSIS — Z01812 Encounter for preprocedural laboratory examination: Secondary | ICD-10-CM

## 2022-07-29 DIAGNOSIS — I2510 Atherosclerotic heart disease of native coronary artery without angina pectoris: Secondary | ICD-10-CM

## 2022-07-29 DIAGNOSIS — R9439 Abnormal result of other cardiovascular function study: Secondary | ICD-10-CM

## 2022-07-29 MED ORDER — SODIUM CHLORIDE 0.9% FLUSH: 3.0000 mL | Freq: Two times a day (BID) | INTRAVENOUS | Status: DC

## 2022-07-29 NOTE — Telephone Encounter (Signed)
     Cardiac/Peripheral Catheterization   You are scheduled for a Cardiac Catheterization on Thursday, October 5 with Dr. Peter Martinique.  1. Please arrive at the Main Entrance A at [facility]: [place], [place] [address] on October 5 at 11:30 AM (This time is two hours before your procedure to ensure your preparation). Free valet parking service is available. You will check in at ADMITTING. The support person will be asked to wait in the waiting room.  It is OK to have someone drop you off and come back when you are ready to be discharged.        Special note: Every effort is made to have your procedure done on time. Please understand that emergencies sometimes delay scheduled procedures.   . 2. Diet: Do not eat solid foods after midnight.  You may have clear liquids until 5 AM the day of the procedure.  3. Labs: You will need to have blood drawn on Monday 10/2 at [place] office. You do not need to be fasting.  4. Medication instructions in preparation for your procedure:     Hold Glimepiride morning of cath.       On the morning of your procedure, take Aspirin 81 mg and any morning medicines NOT listed above.  You may use sips of water.  5. Plan to go home the same day, you will only stay overnight if medically necessary. 6. You MUST have a responsible adult to drive you home. 7. An adult MUST be with you the first 24 hours after you arrive home. 8. Bring a current list of your medications, and the last time and date medication taken. 9. Bring ID and current insurance cards. 10.Please wear clothes that are easy to get on and off and wear slip-on shoes.  Thank you for allowing [place] to care for you!   -- [facility]

## 2022-07-30 ENCOUNTER — Telehealth: Payer: Self-pay | Admitting: *Deleted

## 2022-07-30 ENCOUNTER — Encounter: Payer: Self-pay | Admitting: Adult Health

## 2022-07-30 ENCOUNTER — Ambulatory Visit (INDEPENDENT_AMBULATORY_CARE_PROVIDER_SITE_OTHER): Payer: No Typology Code available for payment source | Admitting: Adult Health

## 2022-07-30 VITALS — BP 120/80 | HR 91 | Temp 97.9°F | Ht 70.5 in | Wt 215.6 lb

## 2022-07-30 DIAGNOSIS — J4520 Mild intermittent asthma, uncomplicated: Secondary | ICD-10-CM

## 2022-07-30 DIAGNOSIS — D869 Sarcoidosis, unspecified: Secondary | ICD-10-CM | POA: Diagnosis not present

## 2022-07-30 DIAGNOSIS — T783XXA Angioneurotic edema, initial encounter: Secondary | ICD-10-CM | POA: Insufficient documentation

## 2022-07-30 DIAGNOSIS — R053 Chronic cough: Secondary | ICD-10-CM

## 2022-07-30 DIAGNOSIS — K219 Gastro-esophageal reflux disease without esophagitis: Secondary | ICD-10-CM

## 2022-07-30 DIAGNOSIS — R21 Rash and other nonspecific skin eruption: Secondary | ICD-10-CM | POA: Diagnosis not present

## 2022-07-30 DIAGNOSIS — T783XXD Angioneurotic edema, subsequent encounter: Secondary | ICD-10-CM

## 2022-07-30 LAB — CBC WITH DIFFERENTIAL/PLATELET
Basophils Absolute: 0 10*3/uL (ref 0.0–0.2)
Basos: 1 %
EOS (ABSOLUTE): 0.3 10*3/uL (ref 0.0–0.4)
Eos: 6 %
Hematocrit: 41.9 % (ref 37.5–51.0)
Hemoglobin: 14 g/dL (ref 13.0–17.7)
Immature Grans (Abs): 0 10*3/uL (ref 0.0–0.1)
Immature Granulocytes: 0 %
Lymphocytes Absolute: 1.8 10*3/uL (ref 0.7–3.1)
Lymphs: 37 %
MCH: 29.9 pg (ref 26.6–33.0)
MCHC: 33.4 g/dL (ref 31.5–35.7)
MCV: 90 fL (ref 79–97)
Monocytes Absolute: 0.6 10*3/uL (ref 0.1–0.9)
Monocytes: 12 %
Neutrophils Absolute: 2.2 10*3/uL (ref 1.4–7.0)
Neutrophils: 44 %
Platelets: 190 10*3/uL (ref 150–450)
RBC: 4.68 x10E6/uL (ref 4.14–5.80)
RDW: 12.6 % (ref 11.6–15.4)
WBC: 4.9 10*3/uL (ref 3.4–10.8)

## 2022-07-30 LAB — PT AND PTT
INR: 1 (ref 0.9–1.2)
Prothrombin Time: 10.9 s (ref 9.1–12.0)
aPTT: 28 s (ref 24–33)

## 2022-07-30 LAB — BASIC METABOLIC PANEL
BUN/Creatinine Ratio: 11 (ref 10–24)
BUN: 17 mg/dL (ref 8–27)
CO2: 26 mmol/L (ref 20–29)
Calcium: 9.6 mg/dL (ref 8.6–10.2)
Chloride: 103 mmol/L (ref 96–106)
Creatinine, Ser: 1.61 mg/dL — ABNORMAL HIGH (ref 0.76–1.27)
Glucose: 103 mg/dL — ABNORMAL HIGH (ref 70–99)
Potassium: 4.3 mmol/L (ref 3.5–5.2)
Sodium: 141 mmol/L (ref 134–144)
eGFR: 48 mL/min/{1.73_m2} — ABNORMAL LOW (ref >59)

## 2022-07-30 MED ORDER — BENZONATATE 200 MG PO CAPS: 200.0000 mg | ORAL_CAPSULE | Freq: Three times a day (TID) | ORAL | 1 refills | Status: AC | PRN

## 2022-07-30 MED ORDER — PREDNISONE 20 MG PO TABS: 20.0000 mg | ORAL_TABLET | Freq: Every day | ORAL | 0 refills | Status: DC

## 2022-07-30 NOTE — Telephone Encounter (Addendum)
Cardiac Catheterization scheduled at [facility] for: Thursday August 01, 2022 1:30 PM Arrival time and place: [place] Entrance A at: 9:30 AM-pre-procedure hydration  Nothing to eat after midnight prior to procedure, clear liquids until 5 AM day of procedure.  Medication instructions: -Hold:  Glimepiride-will hold HS prior to procedure -Except hold medications usual morning medications can be taken with sips of water including aspirin 81 mg.  Confirmed patient has responsible adult to drive home post procedure and be with patient first 24 hours after arriving home.  Patient reports no new symptoms concerning for COVID-19 in the past 10 days.  Reviewed procedure instructions, pre-procedure hydration with patient.

## 2022-07-30 NOTE — Assessment & Plan Note (Signed)
Chronic cough.  Questionable sarcoid versus upper airway cough syndrome.  Short course of steroids.  And cough control regimen  Plan  Patient Instructions  Remain off Losartan with history of Angioedema  Follow up with cardiology and PCP for blood pressure management  Keep follow up for heart cath this week   Continue on Dulera 2 puffs Twice daily  , rinse after use  Prednisone 20mg  daily for 5 days , take with food.  Deslym 2 tsp Twice daily  for cough , As needed   Tessalon Three times a day  for cough, as needed  Sips of water to soothe throat, to avoid throat clearing and cough .  Begin Prilosec 20mg  daily  Begin Pepcid 20mg  At bedtime   Being Zyrtec 10mg  At bedtime    Refer to Deramatology for rash , hx of sarcoid and psoriasis .   Follow up with Dr. Erin Fulling or Sherilee Smotherman NP in 4 weeks and As needed

## 2022-07-30 NOTE — Patient Instructions (Addendum)
Remain off Losartan with history of Angioedema  Follow up with cardiology and PCP for blood pressure management  Keep follow up for heart cath this week   Continue on Dulera 2 puffs Twice daily  , rinse after use  Prednisone 20mg  daily for 5 days , take with food.  Deslym 2 tsp Twice daily  for cough , As needed   Tessalon Three times a day  for cough, as needed  Sips of water to soothe throat, to avoid throat clearing and cough .  Begin Prilosec 20mg  daily  Begin Pepcid 20mg  At bedtime   Being Zyrtec 10mg  At bedtime    Refer to Deramatology for rash , hx of sarcoid and psoriasis .   Follow up with Dr. Erin Fulling or Jeriah Skufca NP in 4 weeks and As needed

## 2022-07-30 NOTE — Assessment & Plan Note (Signed)
Continue on Dulera advised to use 2 puffs twice daily.  Plan  Patient Instructions  Remain off Losartan with history of Angioedema  Follow up with cardiology and PCP for blood pressure management  Keep follow up for heart cath this week   Continue on Dulera 2 puffs Twice daily  , rinse after use  Prednisone 20mg  daily for 5 days , take with food.  Deslym 2 tsp Twice daily  for cough , As needed   Tessalon Three times a day  for cough, as needed  Sips of water to soothe throat, to avoid throat clearing and cough .  Begin Prilosec 20mg  daily  Begin Pepcid 20mg  At bedtime   Being Zyrtec 10mg  At bedtime    Refer to Deramatology for rash , hx of sarcoid and psoriasis .   Follow up with Dr. Erin Fulling or Aja Whitehair NP in 4 weeks and As needed

## 2022-07-30 NOTE — Assessment & Plan Note (Signed)
Recent CT scan shows no evidence of interstitial lung disease does show perilymphatic scattered pulmonary nodules.  PFT showed moderate restriction.  Patient has ongoing chronic cough questionable active sarcoid versus upper airway cough syndrome.  We will give a very short course of steroids with prednisone 20 mg daily.  Patient has underlying diabetes. If need to do ongoing steroids going forward.  Could consider restarting methotrexate. We will add in cough control regimen and trigger prevention  Plan  Patient Instructions  Remain off Losartan with history of Angioedema  Follow up with cardiology and PCP for blood pressure management  Keep follow up for heart cath this week   Continue on Dulera 2 puffs Twice daily  , rinse after use  Prednisone 20mg  daily for 5 days , take with food.  Deslym 2 tsp Twice daily  for cough , As needed   Tessalon Three times a day  for cough, as needed  Sips of water to soothe throat, to avoid throat clearing and cough .  Begin Prilosec 20mg  daily  Begin Pepcid 20mg  At bedtime   Being Zyrtec 10mg  At bedtime    Refer to Deramatology for rash , hx of sarcoid and psoriasis .   Follow up with Dr. Erin Fulling or Abimbola Aki NP in 4 weeks and As needed

## 2022-07-30 NOTE — Assessment & Plan Note (Signed)
History of episodes of angioedema.  Patient is advised to remain off of losartan advised of the dangers of ongoing use.  Patient is to contact primary care and or cardiology regarding blood pressure management.

## 2022-07-30 NOTE — Progress Notes (Signed)
@Patient  ID: , male    DOB: [birth year], 61 y.o.   MRN: [id number]  Chief Complaint  Patient presents with   Follow-up    Referring provider: [id number]  HPI: 61 year old male never smoker followed for sarcoidosis and chronic cough  Diagnosed with sarcoidosis in 2010 was treated with steroids for around 1 year and then transition to methotrexate for about 3 years.  Initial diagnosis with lymph node biopsy positive consistent with sarcoidosis. History significant for sleep apnea on CPAP Has type 2 diabetes.  Gets biannual ophthalmology follow-ups He is former 2011.  Was an Hotel manager.  TEST/EVENTS :  June 14, 2022 high-resolution CT chest showed scattered perilymphatic distribution of small solid pulmonary nodules largest measuring 0.5 cm compatible with pulmonary sarcoidosis, negative for interstitial lung disease.  07/30/2022 Follow up; Sarcoid and chronic cough  Patient presents for a 39-month follow-up.  Patient has underlying pulmonary sarcoidosis.  Patient complains over the last 2 years he has had an ongoing daily minimally productive dry cough.  Patient has used multiple products over the last couple years without any significant improvement in cough.  He is currently taking Dulera but only using it a few times a week.  Patient has been treated with steroids which he feels does help the cough but does not totally go away.  Cough seems to wax and wane.  He does have intermittent reflux.  Is not taking any reflux medications.  Also has chronic postnasal drip.  Not on any current medications.  Patient says he is sedentary not exercising on a regular basis.  He is retired.  Patient says he has been dealing with angioedema has been told just stop losartan.  Unfortunately he did continue to take this.  He did just stop 2 days ago.  Currently not having any facial or lip swelling.  Patient does complain of a waxing and waning rash that occurs on  his face arms and legs.  Patient says he has been told that he has psoriasis and has been putting a cream on it.  Has never had a skin biopsy.  Patient did have a high-resolution CT chest that was done on June 14, 2022 that showed no evidence of interstitial lung disease.  Did show some perilymphatic distribution of small pulmonary nodules consistent with pulmonary sarcoidosis.  Largest nodule measuring 0.5 cm.  Incidental finding was atherosclerosis.  Patient was referred to cardiology.  Stress Myoview was high risk and showed EF decreased at 33%.  Patient has been recommended for a right and left heart cath that is planned for later this week.  He denies any current chest pain.       Allergies  Allergen Reactions   Latex Swelling   Lisinopril Swelling   Losartan Swelling   Sildenafil Palpitations    Other reaction(s): Headache, Palpitation, Headache, Palpitation, Headache, Palpitations    Immunization History  Administered Date(s) Administered   Influenza-Unspecified 07/28/2006   Moderna Sars-Covid-2 Vaccination 11/24/2019, 12/22/2019, 09/19/2020   Unspecified SARS-COV-2 Vaccination 11/29/2019, 12/30/2019    Past Medical History:  Diagnosis Date   Diabetes mellitus without complication (HCC)    Hyperlipidemia    Hypertension    Kidney disease    Sarcoidosis    Stroke (HCC)     Tobacco History: Social History   Tobacco Use  Smoking Status Never  Smokeless Tobacco Never   Counseling given: Not Answered   Outpatient Medications Prior to Visit  Medication Sig Dispense Refill   albuterol (  VENTOLIN HFA) 108 (90 Base) MCG/ACT inhaler Inhale 1-2 puffs into the lungs every 6 (six) hours as needed for shortness of breath or wheezing.     amoxicillin (AMOXIL) 500 MG capsule TAKE FOUR CAPSULES BY MOUTH ONCE 1 HOUR PRIOR TO THE DENTAL TREATMENT 4 capsule 3   aspirin EC 81 MG tablet Take 81 mg by mouth at bedtime. Swallow whole.     atorvastatin (LIPITOR) 40 MG tablet Take 20  mg by mouth at bedtime.     clobetasol ointment (TEMOVATE) 0.05 % Apply 1 application  topically 2 (two) times daily as needed (skin irritation/skin rash (legs)).     diazepam (VALIUM) 5 MG tablet Take 5 mg by mouth daily as needed for anxiety.     Dulaglutide 1.5 MG/0.5ML SOPN Inject 1.5 mg into the skin every Sunday.     EPINEPHrine 0.3 mg/0.3 mL IJ SOAJ injection Inject 0.3 mg into the muscle as needed for anaphylaxis. 1 each 0   escitalopram (LEXAPRO) 10 MG tablet Take 10 mg by mouth at bedtime.     glimepiride (AMARYL) 4 MG tablet Take 4 mg by mouth every evening.     mometasone-formoterol (DULERA) 100-5 MCG/ACT AERO Inhale 2 puffs into the lungs 2 (two) times daily as needed for wheezing. (Patient taking differently: Inhale 2 puffs into the lungs in the morning and at bedtime.) 1 each 0   traZODone (DESYREL) 100 MG tablet Take 100 mg by mouth at bedtime.     zolpidem (AMBIEN) 10 MG tablet Take 10 mg by mouth at bedtime.     losartan (COZAAR) 50 MG tablet Take 50 mg by mouth daily. (Patient not taking: Reported on 07/30/2022)     Facility-Administered Medications Prior to Visit  Medication Dose Route Frequency Provider Last Rate Last Admin   sodium chloride flush (NS) 0.9 % injection 3 mL  3 mL Intravenous Q12H [place], Peter M, MD         Review of Systems:   Constitutional:   No  weight loss, night sweats,  Fevers, chills,  +fatigue, or  lassitude.  HEENT:   No headaches,  Difficulty swallowing,  Tooth/dental problems, or  Sore throat,                No sneezing, itching, ear ache, nasal congestion, post nasal drip,   CV:  No chest pain,  Orthopnea, PND, swelling in lower extremities, anasarca, dizziness, palpitations, syncope.   GI  No heartburn, indigestion, abdominal pain, nausea, vomiting, diarrhea, change in bowel habits, loss of appetite, bloody stools.   Resp: .  No chest wall deformity  Skin: + rash or lesions.  GU: no dysuria, change in color of urine, no urgency or  frequency.  No flank pain, no hematuria   MS:  No joint pain or swelling.  No decreased range of motion.  No back pain.    Physical Exam  BP 120/80 (BP Location: Left Arm, Patient Position: Sitting, Cuff Size: Large)   Pulse 91   Temp 97.9 F (36.6 C) (Oral)   Ht 5' 10.5" (1.791 m)   Wt 215 lb 9.6 oz (97.8 kg)   SpO2 98%   BMI 30.50 kg/m   GEN: A/Ox3; pleasant , NAD, well nourished    HEENT:  [place]/AT,  NOSE-clear, THROAT-clear, no lesions, no postnasal drip or exudate noted.   NECK:  Supple w/ fair ROM; no JVD; normal carotid impulses w/o bruits; no thyromegaly or nodules palpated; no lymphadenopathy.    RESP  Clear  P & A; w/o, wheezes/ rales/ or rhonchi. no accessory muscle use, no dullness to percussion  CARD:  RRR, no m/r/g, no peripheral edema, pulses intact, no cyanosis or clubbing.  GI:   Soft & nt; nml bowel sounds; no organomegaly or masses detected.   Musco: Warm bil, no deformities or joint swelling noted.   Neuro: alert, no focal deficits noted.    Skin: Warm, scattered hypopigmented areas along face,  Some hyperpigmented areas along the lower legs    Lab Results:  CBC    BNP No results found for: "BNP"  ProBNP No results found for: "PROBNP"  Imaging: MYOCARDIAL PERFUSION IMAGING  Result Date: 07/25/2022   Findings are consistent with no ischemia. The study is high risk.   No ST deviation was noted.   LV perfusion is normal. There is no evidence of ischemia. There is no evidence of infarction.   Left ventricular function is abnormal. Global function is moderately reduced. Nuclear stress EF: 33 %. The left ventricular ejection fraction is moderately decreased (30-44%). End diastolic cavity size is mildly enlarged. End systolic cavity size is moderately enlarged.   Prior study not available for comparison. Normal perfusion study, but EF measures at 33% and visually appears hypokinetic. Recommend echo to more accurately evaluate wall motion and function.          Latest Ref Rng & Units 06/03/2022   10:04 AM  PFT Results  FVC-Pre L 3.05   FVC-Predicted Pre % 63   FVC-Post L 2.98   FVC-Predicted Post % 61   Pre FEV1/FVC % % 85   Post FEV1/FCV % % 87   FEV1-Pre L 2.59   FEV1-Predicted Pre % 71   FEV1-Post L 2.59   DLCO uncorrected ml/min/mmHg 18.61   DLCO UNC% % 66   DLCO corrected ml/min/mmHg 18.61   DLCO COR %Predicted % 66   DLVA Predicted % 97   TLC L 5.01   TLC % Predicted % 70   RV % Predicted % 75     No results found for: "NITRICOXIDE"      Assessment & Plan:   Sarcoidosis Recent CT scan shows no evidence of interstitial lung disease does show perilymphatic scattered pulmonary nodules.  PFT showed moderate restriction.  Patient has ongoing chronic cough questionable active sarcoid versus upper airway cough syndrome.  We will give a very short course of steroids with prednisone 20 mg daily.  Patient has underlying diabetes. If need to do ongoing steroids going forward.  Could consider restarting methotrexate. We will add in cough control regimen and trigger prevention  Plan  Patient Instructions  Remain off Losartan with history of Angioedema  Follow up with cardiology and PCP for blood pressure management  Keep follow up for heart cath this week   Continue on Dulera 2 puffs Twice daily  , rinse after use  Prednisone 20mg  daily for 5 days , take with food.  Deslym 2 tsp Twice daily  for cough , As needed   Tessalon Three times a day  for cough, as needed  Sips of water to soothe throat, to avoid throat clearing and cough .  Begin Prilosec 20mg  daily  Begin Pepcid 20mg  At bedtime   Being Zyrtec 10mg  At bedtime    Refer to Deramatology for rash , hx of sarcoid and psoriasis .   Follow up with Dr. Erin Fulling or Tiari Andringa NP in 4 weeks and As needed        Reactive airway disease Continue  on Dulera advised to use 2 puffs twice daily.  Plan  Patient Instructions  Remain off Losartan with history of Angioedema   Follow up with cardiology and PCP for blood pressure management  Keep follow up for heart cath this week   Continue on Dulera 2 puffs Twice daily  , rinse after use  Prednisone 20mg  daily for 5 days , take with food.  Deslym 2 tsp Twice daily  for cough , As needed   Tessalon Three times a day  for cough, as needed  Sips of water to soothe throat, to avoid throat clearing and cough .  Begin Prilosec 20mg  daily  Begin Pepcid 20mg  At bedtime   Being Zyrtec 10mg  At bedtime    Refer to Deramatology for rash , hx of sarcoid and psoriasis .   Follow up with Dr. or Hazyl Marseille NP in 4 weeks and As needed        Rash Waxing and waning rash question radiology.  Previous diagnosis of psoriasis.  Will refer to dermatology for further evaluation.  Angioedema History of episodes of angioedema.  Patient is advised to remain off of losartan advised of the dangers of ongoing use.  Patient is to contact primary care and or cardiology regarding blood pressure management.   GERD (gastroesophageal reflux disease) Intermittent GERD -GERD diet add in Prilosec and Pepcid.  Chronic cough Chronic cough.  Questionable sarcoid versus upper airway cough syndrome.  Short course of steroids.  And cough control regimen  Plan  Patient Instructions  Remain off Losartan with history of Angioedema  Follow up with cardiology and PCP for blood pressure management  Keep follow up for heart cath this week   Continue on Dulera 2 puffs Twice daily  , rinse after use  Prednisone 20mg  daily for 5 days , take with food.  Deslym 2 tsp Twice daily  for cough , As needed   Tessalon Three times a day  for cough, as needed  Sips of water to soothe throat, to avoid throat clearing and cough .  Begin Prilosec 20mg  daily  Begin Pepcid 20mg  At bedtime   Being Zyrtec 10mg  At bedtime    Refer to Deramatology for rash , hx of sarcoid and psoriasis .   Follow up with Dr. or Angelise Petrich NP in 4 weeks and As needed           , NP 07/30/2022

## 2022-07-30 NOTE — Assessment & Plan Note (Signed)
Intermittent GERD -GERD diet add in Prilosec and Pepcid.

## 2022-07-30 NOTE — Assessment & Plan Note (Signed)
Waxing and waning rash question radiology.  Previous diagnosis of psoriasis.  Will refer to dermatology for further evaluation.

## 2022-08-01 ENCOUNTER — Ambulatory Visit (HOSPITAL_COMMUNITY)
Admission: RE | Admit: 2022-08-01 | Discharge: 2022-08-01 | Disposition: A | Discharge status: Home / Self Care | Payer: No Typology Code available for payment source | Attending: Cardiology | Admitting: Cardiology

## 2022-08-01 ENCOUNTER — Other Ambulatory Visit: Payer: Self-pay | Admitting: Physician Assistant

## 2022-08-01 ENCOUNTER — Encounter (HOSPITAL_COMMUNITY)
Admission: RE | Disposition: A | Discharge status: Home / Self Care | Payer: Self-pay | Source: Home / Self Care | Attending: Cardiology

## 2022-08-01 ENCOUNTER — Other Ambulatory Visit: Payer: Self-pay | Admitting: Cardiology

## 2022-08-01 ENCOUNTER — Other Ambulatory Visit: Payer: Self-pay

## 2022-08-01 DIAGNOSIS — D869 Sarcoidosis, unspecified: Secondary | ICD-10-CM | POA: Insufficient documentation

## 2022-08-01 DIAGNOSIS — E119 Type 2 diabetes mellitus without complications: Secondary | ICD-10-CM | POA: Diagnosis not present

## 2022-08-01 DIAGNOSIS — I10 Essential (primary) hypertension: Secondary | ICD-10-CM | POA: Diagnosis not present

## 2022-08-01 DIAGNOSIS — I2510 Atherosclerotic heart disease of native coronary artery without angina pectoris: Secondary | ICD-10-CM | POA: Insufficient documentation

## 2022-08-01 DIAGNOSIS — R0609 Other forms of dyspnea: Secondary | ICD-10-CM | POA: Diagnosis not present

## 2022-08-01 DIAGNOSIS — Z8673 Personal history of transient ischemic attack (TIA), and cerebral infarction without residual deficits: Secondary | ICD-10-CM | POA: Insufficient documentation

## 2022-08-01 DIAGNOSIS — Z7982 Long term (current) use of aspirin: Secondary | ICD-10-CM | POA: Diagnosis not present

## 2022-08-01 DIAGNOSIS — I429 Cardiomyopathy, unspecified: Secondary | ICD-10-CM

## 2022-08-01 DIAGNOSIS — Z79899 Other long term (current) drug therapy: Secondary | ICD-10-CM | POA: Diagnosis not present

## 2022-08-01 DIAGNOSIS — G4733 Obstructive sleep apnea (adult) (pediatric): Secondary | ICD-10-CM | POA: Diagnosis not present

## 2022-08-01 DIAGNOSIS — R9439 Abnormal result of other cardiovascular function study: Secondary | ICD-10-CM

## 2022-08-01 DIAGNOSIS — E785 Hyperlipidemia, unspecified: Secondary | ICD-10-CM | POA: Diagnosis not present

## 2022-08-01 HISTORY — PX: RIGHT/LEFT HEART CATH AND CORONARY ANGIOGRAPHY: CATH118266

## 2022-08-01 LAB — GLUCOSE, CAPILLARY: Glucose-Capillary: 68 mg/dL — ABNORMAL LOW (ref 70–99)

## 2022-08-01 SURGERY — RIGHT/LEFT HEART CATH AND CORONARY ANGIOGRAPHY
Anesthesia: LOCAL

## 2022-08-01 MED ORDER — GLUCAGON HCL RDNA (DIAGNOSTIC) 1 MG IJ SOLR: 1.0000 mg | INTRAMUSCULAR | Status: DC

## 2022-08-01 MED ORDER — ONDANSETRON HCL 4 MG/2ML IJ SOLN: 4.0000 mg | Freq: Four times a day (QID) | INTRAMUSCULAR | Status: DC | PRN

## 2022-08-01 MED ORDER — LIDOCAINE HCL (PF) 1 % IJ SOLN
INTRAMUSCULAR | Status: DC | PRN
  Administered 2022-08-01: 5 mL

## 2022-08-01 MED ORDER — SODIUM CHLORIDE 0.9 % WEIGHT BASED INFUSION: 1.0000 mL/kg/h | INTRAVENOUS | Status: DC

## 2022-08-01 MED ORDER — ACETAMINOPHEN 325 MG PO TABS: 650.0000 mg | ORAL_TABLET | ORAL | Status: DC | PRN

## 2022-08-01 MED ORDER — DEXTROSE 50 % IV SOLN: 25.0000 mL | Freq: Once | INTRAVENOUS | Status: AC

## 2022-08-01 MED ORDER — FENTANYL CITRATE (PF) 100 MCG/2ML IJ SOLN
INTRAMUSCULAR | Status: AC
  Filled 2022-08-01: qty 2

## 2022-08-01 MED ORDER — LIDOCAINE HCL (PF) 1 % IJ SOLN
INTRAMUSCULAR | Status: AC
  Filled 2022-08-01: qty 30

## 2022-08-01 MED ORDER — HEPARIN SODIUM (PORCINE) 1000 UNIT/ML IJ SOLN
INTRAMUSCULAR | Status: AC
  Filled 2022-08-01: qty 10

## 2022-08-01 MED ORDER — MIDAZOLAM HCL 2 MG/2ML IJ SOLN
INTRAMUSCULAR | Status: DC | PRN
  Administered 2022-08-01: 1 mg via INTRAVENOUS

## 2022-08-01 MED ORDER — SODIUM CHLORIDE 0.9% FLUSH: 3.0000 mL | INTRAVENOUS | Status: DC | PRN

## 2022-08-01 MED ORDER — VERAPAMIL HCL 2.5 MG/ML IV SOLN
INTRAVENOUS | Status: AC
  Filled 2022-08-01: qty 2

## 2022-08-01 MED ORDER — FENTANYL CITRATE (PF) 100 MCG/2ML IJ SOLN
INTRAMUSCULAR | Status: DC | PRN
  Administered 2022-08-01: 25 ug via INTRAVENOUS

## 2022-08-01 MED ORDER — MIDAZOLAM HCL 2 MG/2ML IJ SOLN
INTRAMUSCULAR | Status: AC
  Filled 2022-08-01: qty 2

## 2022-08-01 MED ORDER — VERAPAMIL HCL 2.5 MG/ML IV SOLN
INTRAVENOUS | Status: DC | PRN
  Administered 2022-08-01: 10 mL via INTRA_ARTERIAL

## 2022-08-01 MED ORDER — SODIUM CHLORIDE 0.9% FLUSH: 3.0000 mL | Freq: Two times a day (BID) | INTRAVENOUS | Status: DC

## 2022-08-01 MED ORDER — HEPARIN (PORCINE) IN NACL 1000-0.9 UT/500ML-% IV SOLN
INTRAVENOUS | Status: DC | PRN
  Administered 2022-08-01 (×2): 500 mL

## 2022-08-01 MED ORDER — SODIUM CHLORIDE 0.9 % IV SOLN: 250.0000 mL | INTRAVENOUS | Status: DC | PRN

## 2022-08-01 MED ORDER — HEPARIN SODIUM (PORCINE) 1000 UNIT/ML IJ SOLN
INTRAMUSCULAR | Status: DC | PRN
  Administered 2022-08-01: 5000 [IU] via INTRAVENOUS

## 2022-08-01 MED ORDER — ASPIRIN 81 MG PO CHEW: 81.0000 mg | CHEWABLE_TABLET | ORAL | Status: DC

## 2022-08-01 MED ORDER — SODIUM CHLORIDE 0.9 % WEIGHT BASED INFUSION
3.0000 mL/kg/h | INTRAVENOUS | Status: AC
  Administered 2022-08-01: 3 mL/kg/h via INTRAVENOUS

## 2022-08-01 MED ORDER — DEXTROSE 50 % IV SOLN
INTRAVENOUS | Status: AC
  Administered 2022-08-01: 25 mL via INTRAVENOUS
  Filled 2022-08-01: qty 50

## 2022-08-01 MED ORDER — LABETALOL HCL 5 MG/ML IV SOLN: 10.0000 mg | INTRAVENOUS | Status: DC | PRN

## 2022-08-01 MED ORDER — IOHEXOL 350 MG/ML SOLN
INTRAVENOUS | Status: DC | PRN
  Administered 2022-08-01: 45 mL

## 2022-08-01 MED ORDER — HEPARIN (PORCINE) IN NACL 1000-0.9 UT/500ML-% IV SOLN
INTRAVENOUS | Status: AC
  Filled 2022-08-01: qty 1000

## 2022-08-01 SURGICAL SUPPLY — 12 items
BAND CMPR LRG ZPHR (HEMOSTASIS) ×1
BAND ZEPHYR COMPRESS 30 LONG (HEMOSTASIS) IMPLANT
CATH 5FR JL3.5 JR4 ANG PIG MP (CATHETERS) IMPLANT
CATH BALLN WEDGE 5F 110CM (CATHETERS) IMPLANT
GLIDESHEATH SLEND SS 6F .021 (SHEATH) IMPLANT
GUIDEWIRE INQWIRE 1.5J.035X260 (WIRE) IMPLANT
INQWIRE 1.5J .035X260CM (WIRE) ×1
KIT HEART LEFT (KITS) ×1 IMPLANT
PACK CARDIAC CATHETERIZATION (CUSTOM PROCEDURE TRAY) ×1 IMPLANT
SHEATH GLIDE SLENDER 4/5FR (SHEATH) IMPLANT
TRANSDUCER W/STOPCOCK (MISCELLANEOUS) ×1 IMPLANT
TUBING CIL FLEX 10 FLL-RA (TUBING) ×1 IMPLANT

## 2022-08-01 NOTE — Progress Notes (Signed)
Ordered echo for new cardiomyopathy on heart cath

## 2022-08-01 NOTE — Interval H&P Note (Signed)
Cath Lab Visit (complete for each Cath Lab visit)  Clinical Evaluation Leading to the Procedure:   ACS: No.  Non-ACS:    Anginal Classification: CCS II  Anti-ischemic medical therapy: No Therapy  Non-Invasive Test Results: High-risk stress test findings: cardiac mortality >3%/year  Prior CABG: No previous CABG      History and Physical Interval Note:  08/01/2022 12:33 PM  Brendan [name]  has presented today for surgery, with the diagnosis of low ef - abnormal myview.  The various methods of treatment have been discussed with the patient and family. After consideration of risks, benefits and other options for treatment, the patient has consented to  Procedure(s): RIGHT/LEFT HEART CATH AND CORONARY ANGIOGRAPHY (N/A) as a surgical intervention.  The patient's history has been reviewed, patient examined, no change in status, stable for surgery.  I have reviewed the patient's chart and labs.  Questions were answered to the patient's satisfaction.     Collier [facility]ina [facility] 08/01/2022 12:34 PM

## 2022-08-02 ENCOUNTER — Encounter (HOSPITAL_COMMUNITY): Payer: Self-pay | Admitting: Cardiology

## 2022-08-02 ENCOUNTER — Ambulatory Visit: Payer: No Typology Code available for payment source | Admitting: Cardiology

## 2022-08-02 LAB — POCT I-STAT EG7
Acid-base deficit: 1 mmol/L (ref 0.0–2.0)
Acid-base deficit: 2 mmol/L (ref 0.0–2.0)
Acid-base deficit: 2 mmol/L (ref 0.0–2.0)
Bicarbonate: 23.1 mmol/L (ref 20.0–28.0)
Bicarbonate: 23.3 mmol/L (ref 20.0–28.0)
Bicarbonate: 24.6 mmol/L (ref 20.0–28.0)
Calcium, Ion: 1.17 mmol/L (ref 1.15–1.40)
Calcium, Ion: 1.23 mmol/L (ref 1.15–1.40)
Calcium, Ion: 1.3 mmol/L (ref 1.15–1.40)
HCT: 34 % — ABNORMAL LOW (ref 39.0–52.0)
HCT: 37 % — ABNORMAL LOW (ref 39.0–52.0)
HCT: 37 % — ABNORMAL LOW (ref 39.0–52.0)
Hemoglobin: 11.6 g/dL — ABNORMAL LOW (ref 13.0–17.0)
Hemoglobin: 12.6 g/dL — ABNORMAL LOW (ref 13.0–17.0)
Hemoglobin: 12.6 g/dL — ABNORMAL LOW (ref 13.0–17.0)
O2 Saturation: 75 %
O2 Saturation: 79 %
O2 Saturation: 81 %
Potassium: 3.6 mmol/L (ref 3.5–5.1)
Potassium: 3.8 mmol/L (ref 3.5–5.1)
Potassium: 3.9 mmol/L (ref 3.5–5.1)
Sodium: 142 mmol/L (ref 135–145)
Sodium: 142 mmol/L (ref 135–145)
Sodium: 143 mmol/L (ref 135–145)
TCO2: 24 mmol/L (ref 22–32)
TCO2: 25 mmol/L (ref 22–32)
TCO2: 26 mmol/L (ref 22–32)
pCO2, Ven: 39.9 mmHg — ABNORMAL LOW (ref 44–60)
pCO2, Ven: 42.2 mmHg — ABNORMAL LOW (ref 44–60)
pCO2, Ven: 42.9 mmHg — ABNORMAL LOW (ref 44–60)
pH, Ven: 7.349 (ref 7.25–7.43)
pH, Ven: 7.367 (ref 7.25–7.43)
pH, Ven: 7.371 (ref 7.25–7.43)
pO2, Ven: 41 mmHg (ref 32–45)
pO2, Ven: 46 mmHg — ABNORMAL HIGH (ref 32–45)
pO2, Ven: 46 mmHg — ABNORMAL HIGH (ref 32–45)

## 2022-08-02 LAB — POCT I-STAT 7, (LYTES, BLD GAS, ICA,H+H)
Acid-base deficit: 2 mmol/L (ref 0.0–2.0)
Bicarbonate: 22.8 mmol/L (ref 20.0–28.0)
Calcium, Ion: 1.24 mmol/L (ref 1.15–1.40)
HCT: 36 % — ABNORMAL LOW (ref 39.0–52.0)
Hemoglobin: 12.2 g/dL — ABNORMAL LOW (ref 13.0–17.0)
O2 Saturation: 98 %
Potassium: 3.8 mmol/L (ref 3.5–5.1)
Sodium: 142 mmol/L (ref 135–145)
TCO2: 24 mmol/L (ref 22–32)
pCO2 arterial: 37.1 mmHg (ref 32–48)
pH, Arterial: 7.398 (ref 7.35–7.45)
pO2, Arterial: 100 mmHg (ref 83–108)

## 2022-08-08 ENCOUNTER — Telehealth: Payer: Self-pay | Admitting: Pulmonary Disease

## 2022-08-09 NOTE — Telephone Encounter (Signed)
PCC's, please advise if authorization from the [place] was requested when outgoing referral was sent to Dr. Peter Martinique. Thanks

## 2022-08-11 NOTE — Progress Notes (Unsigned)
Cardiology Office Note:    Date:  08/15/2022   ID:  Gearldine [name], DOB 12-Feb-1961, MRN [id number]  PCP:  Default, Provider, MD   [facility] Providers Cardiologist:  None     Referring MD: No ref. provider found   Chief Complaint  Patient presents with   Coronary Artery Disease    History of Present Illness:    Brendan [name] is a 61 y.o. male seen for follow up CAD. He had coronary and aortic calcification noted on CT. He has a history of Sarcoidosis, DM, HLD, and OSA on CPAP. He did have an Echo in March 2022 at [facility] showing EF 50-55%. Mild LVH. Mild diastolic dysfunction. There was an interatrial septal aneurysm with shunt (PFO) but agitated saline. Mild MR. Cranial MRI at that time showed 2 old lacunar infarcts and a small old cortical infarct in the posterior inferior right occipital lobe. CTA of head and neck showed no significant stenosis. He was placed on ASA by Neurology then. He does note his activity has been limited since his bilateral TKR. He does have some SOB on exertion that he attributes to his sarcoidosis. His primary care (Dr Francene [name]) in [place] follows his lab work. No prior stress test, MI or CHF. No syncope.   He underwent Myoview study which was high risk due to low EF 33%. No ischemia. Cardiac cath was then performed showing moderate 2 vessel CAD. Normal right heart and LV filling pressures. Echo performed yesterday showed normal EF. He still denies any chest pain. Anxious to get back to physical activity.   Past Medical History:  Diagnosis Date   Diabetes mellitus without complication (HCC)    Hyperlipidemia    Hypertension    Kidney disease    Sarcoidosis    Stroke [facility])     Past Surgical History:  Procedure Laterality Date   JOINT REPLACEMENT Bilateral    TKR   RIGHT/LEFT HEART CATH AND CORONARY ANGIOGRAPHY N/A 08/01/2022   Procedure: RIGHT/LEFT HEART CATH AND CORONARY ANGIOGRAPHY;  Surgeon: [place], Edom Schmuhl M, MD;   Location: [facility] INVASIVE CV LAB;  Service: Cardiovascular;  Laterality: N/A;   ROTATOR CUFF REPAIR Left     Current Medications: Current Meds  Medication Sig   aspirin EC 81 MG tablet Take 81 mg by mouth at bedtime. Swallow whole.   atorvastatin (LIPITOR) 40 MG tablet Take 20 mg by mouth at bedtime.   benzonatate (TESSALON) 200 MG capsule Take 1 capsule (200 mg total) by mouth 3 (three) times daily as needed.   clobetasol cream (TEMOVATE) 0.05 % SMARTSIG:1 Topical Daily   diazepam (VALIUM) 5 MG tablet Take 5 mg by mouth daily as needed for anxiety.   Dulaglutide 1.5 MG/0.5ML SOPN Inject 1.5 mg into the skin every Sunday.   escitalopram (LEXAPRO) 10 MG tablet Take 10 mg by mouth at bedtime.   glimepiride (AMARYL) 4 MG tablet Take 4 mg by mouth every evening.   mometasone-formoterol (DULERA) 100-5 MCG/ACT AERO Inhale 2 puffs into the lungs 2 (two) times daily as needed for wheezing. (Patient taking differently: Inhale 2 puffs into the lungs in the morning and at bedtime.)   predniSONE (DELTASONE) 20 MG tablet Take 1 tablet (20 mg total) by mouth daily with breakfast.   traZODone (DESYREL) 100 MG tablet Take 100 mg by mouth at bedtime.   zolpidem (AMBIEN) 10 MG tablet Take 10 mg by mouth at bedtime.     Allergies:   Latex, Lisinopril, Losartan, and Sildenafil  Social History   Socioeconomic History   Marital status: Married    Spouse name: Not on file   Number of children: 3   Years of education: Not on file   Highest education level: Not on file  Occupational History   Not on file  Tobacco Use   Smoking status: Never   Smokeless tobacco: Never  Substance and Sexual Activity   Alcohol use: Not Currently   Drug use: Not Currently   Sexual activity: Not on file  Other Topics Concern   Not on file  Social History Narrative   Retired Art gallery manager for [employer]   Social Determinants of Corporate investment banker Strain: Not on file  Food Insecurity: Not on file  Transportation Needs:  Not on file  Physical Activity: Not on file  Stress: Not on file  Social Connections: Not on file     Family History: The patient's family history includes Heart failure (age of onset: 35) in his mother.  ROS:   Please see the history of present illness.     All other systems reviewed and are negative.  EKGs/Labs/Other Studies Reviewed:    The following studies were reviewed today: CLINICAL DATA:  Sarcoidosis. Restrictive lung disease. Evaluate for interstitial lung disease.   EXAM: CT CHEST WITHOUT CONTRAST   TECHNIQUE: Multidetector CT imaging of the chest was performed following the standard protocol without intravenous contrast. High resolution imaging of the lungs, as well as inspiratory and expiratory imaging, was performed.   RADIATION DOSE REDUCTION: This exam was performed according to the departmental dose-optimization program which includes automated exposure control, adjustment of the mA and/or kV according to patient size and/or use of iterative reconstruction technique.   COMPARISON:  04/03/2022 chest radiograph.   FINDINGS: Cardiovascular: Normal heart size. No significant pericardial effusion/thickening. Three-vessel coronary atherosclerosis. Mildly atherosclerotic nonaneurysmal thoracic aorta. Normal caliber pulmonary arteries.   Mediastinum/Nodes: No discrete thyroid nodules. Unremarkable esophagus. No axillary adenopathy. Shotty nonenlarged paratracheal and prevascular mediastinal nodes. No pathologically enlarged mediastinal or discrete hilar nodes on these noncontrast images.   Lungs/Pleura: No pneumothorax. No pleural effusion. Several scattered small solid peribronchovascular the perifissural and subpleural pulmonary nodules in both lungs, for example a 0.5 cm right upper lobe peribronchovascular nodule (series 6/image 65) and a 0.5 cm left major fissure nodule (series 6/image 69). No acute consolidative airspace disease or lung masses. No  significant regions of subpleural reticulation, ground-glass opacity, traction bronchiectasis, architectural distortion or frank honeycombing. No significant lobular air trapping or evidence of tracheobronchomalacia on the expiration sequence.   Upper abdomen: Subcentimeter posterior right liver dome hypodense lesion is too small to characterize.   Musculoskeletal: No aggressive appearing focal osseous lesions. Mild thoracic spondylosis.   IMPRESSION: 1. Scattered perilymphatic distribution small solid pulmonary nodules, largest 0.5 cm, compatible with pulmonary sarcoidosis. 2. No evidence of interstitial lung disease. 3. Three-vessel coronary atherosclerosis. 4. Aortic Atherosclerosis (ICD10-I70.0).     Electronically Signed   By: Delbert Phenix M.D.   On: 06/17/2022 15:38   Myoview 07/25/22: Study Highlights      Findings are consistent with no ischemia. The study is high risk.   No ST deviation was noted.   LV perfusion is normal. There is no evidence of ischemia. There is no evidence of infarction.   Left ventricular function is abnormal. Global function is moderately reduced. Nuclear stress EF: 33 %. The left ventricular ejection fraction is moderately decreased (30-44%). End diastolic cavity size is mildly enlarged. End systolic cavity size is  moderately enlarged.   Prior study not available for comparison.   Normal perfusion study, but EF measures at 33% and visually appears hypokinetic. Recommend echo to more accurately evaluate wall motion and function.    Cardiac cath 08/01/22:  RIGHT/LEFT HEART CATH AND CORONARY ANGIOGRAPHY   Conclusion      Mid LAD lesion is 70% stenosed.   1st Diag lesion is 90% stenosed.   2nd Diag lesion is 90% stenosed.   Prox Cx to Mid Cx lesion is 75% stenosed.   Prox RCA lesion is 40% stenosed.   LV end diastolic pressure is normal.   2 vessel obstructive CAD. There is severe disease in 2 small diagonal vessels. Moderate disease in the  mid LAD and mid LCx.  Normal LV filling pressures. LVEDP 12 mm Hg. PCWP 12/10 with mean 10 mm Hg. Normal right heart pressures. PAP 22/10 with mean 16 mm Hg Normal cardiac output 7.1 L/min with index 3.33   Plan: will obtain an Echo to better assess LV function. I don't think his degree of CAD can explain that low EF. If low EF is confirmed on Echo will optimize CHF therapy. The patient has no active angina and perfusion on Myoview is Ok so I would not pursue revascularization at this point.   Coronary Diagrams  Diagnostic Dominance: Right  Intervention  Echo 08/14/22: IMPRESSIONS     1. Left ventricular ejection fraction, by estimation, is 60 to 65%. The  left ventricle has normal function. The left ventricle has no regional  wall motion abnormalities. Left ventricular diastolic parameters were  normal.   2. Right ventricular systolic function is normal. The right ventricular  size is normal.   3. Interatrial septum is hypermobile No PFO seen by color doppler.   4. The mitral valve is normal in structure. Mild mitral valve  regurgitation.   5. The aortic valve is normal in structure. Aortic valve regurgitation is  not visualized.   6. The inferior vena cava is normal in size with greater than 50%  respiratory variability, suggesting right atrial pressure of 3 mmHg.   EKG:  EKG is  not ordered today.    Recent Labs: 07/29/2022: BUN 17; Creatinine, Ser 1.61; Platelets 190 08/01/2022: Hemoglobin 11.6; Potassium 3.6; Sodium 142  Recent Lipid Panel No results found for: "CHOL", "TRIG", "HDL", "CHOLHDL", "VLDL", "LDLCALC", "LDLDIRECT"   Risk Assessment/Calculations:                Physical Exam:    VS:  BP 122/82 (BP Location: Left Arm, Patient Position: Sitting, Cuff Size: Large)   Pulse 84   Ht 5' 10.5" (1.791 m)   Wt 220 lb (99.8 kg)   SpO2 93%   BMI 31.12 kg/m     Wt Readings from Last 3 Encounters:  08/15/22 220 lb (99.8 kg)  08/01/22 214 lb (97.1 kg)   07/30/22 215 lb 9.6 oz (97.8 kg)     GEN:  Well nourished, well developed in no acute distress HEENT: Normal NECK: No JVD; No carotid bruits LYMPHATICS: No lymphadenopathy CARDIAC: RRR, no murmurs, rubs, gallops RESPIRATORY:  Clear to auscultation without rales, wheezing or rhonchi  ABDOMEN: Soft, non-tender, non-distended MUSCULOSKELETAL:  No edema; No deformity  SKIN: Warm and dry NEUROLOGIC:  Alert and oriented x 3 PSYCHIATRIC:  Normal affect   ASSESSMENT:    1. Coronary artery calcification   2. Coronary artery disease of native artery of native heart with stable angina pectoris (HCC)   3. Type 2  diabetes mellitus without complication, without long-term current use of insulin ([place])   4. Hyperlipidemia, unspecified hyperlipidemia type     PLAN:    In order of problems listed above:  Coronary artery calcification. Multiple coronary risk factors.   Myoview was high risk due to low EF but showed normal perfusion. Cath showed severe disease in small diagonal branches. Moderate LAD and LCx disease. Given lack of ischemia on stress test and no angina medical therapy recommended. Stressed importance of risk factor modification.  Low EF noted on Myoview study but by Echo EF is normal. No additional therapy needed.  DM on oral therapy. Intolerance of metformin. Now on glimiperide only. Reports A1c 2 weeks ago with PCP was normal.  HLD. On statin. Also reports recent labs good with PCP HTN. Currently on no antihypertensive therapy. Losartan resulted in angioedema. BP is OK.  OSA Sarcoidosis. Per pulmonary. No overt evidence of cardiac sarcoid.  History of CVA on ASA     Shared Decision Making/Informed Consent The risks [chest pain, shortness of breath, cardiac arrhythmias, dizziness, blood pressure fluctuations, myocardial infarction, stroke/transient ischemic attack, nausea, vomiting, allergic reaction, radiation exposure, metallic taste sensation and life-threatening complications  (estimated to be 1 in 10,000)], benefits (risk stratification, diagnosing coronary artery disease, treatment guidance) and alternatives of a nuclear stress test were discussed in detail with Mr. Coffelt and he agrees to proceed.    Medication Adjustments/Labs and Tests Ordered: Current medicines are reviewed at length with the patient today.  Concerns regarding medicines are outlined above.  No orders of the defined types were placed in this encounter.  No orders of the defined types were placed in this encounter.   There are no Patient Instructions on file for this visit.    Plan follow up in  one year  Signed, Robert Sperl [place], MD  08/15/2022 8:20 AM    [place]

## 2022-08-13 DIAGNOSIS — K08409 Partial loss of teeth, unspecified cause, unspecified class: Secondary | ICD-10-CM | POA: Diagnosis not present

## 2022-08-13 NOTE — Telephone Encounter (Signed)
Brendan [name] has filled out the form I have scanned it and sent it to the [place] for the auth

## 2022-08-14 ENCOUNTER — Ambulatory Visit: Payer: No Typology Code available for payment source | Attending: Internal Medicine

## 2022-08-14 DIAGNOSIS — E119 Type 2 diabetes mellitus without complications: Secondary | ICD-10-CM | POA: Diagnosis present

## 2022-08-14 DIAGNOSIS — E785 Hyperlipidemia, unspecified: Secondary | ICD-10-CM | POA: Diagnosis present

## 2022-08-14 DIAGNOSIS — I25118 Atherosclerotic heart disease of native coronary artery with other forms of angina pectoris: Secondary | ICD-10-CM | POA: Insufficient documentation

## 2022-08-14 DIAGNOSIS — I2584 Coronary atherosclerosis due to calcified coronary lesion: Secondary | ICD-10-CM | POA: Diagnosis present

## 2022-08-14 DIAGNOSIS — I429 Cardiomyopathy, unspecified: Secondary | ICD-10-CM | POA: Diagnosis present

## 2022-08-14 DIAGNOSIS — I2510 Atherosclerotic heart disease of native coronary artery without angina pectoris: Secondary | ICD-10-CM | POA: Insufficient documentation

## 2022-08-14 LAB — ECHOCARDIOGRAM COMPLETE
Area-P 1/2: 4.49 cm2
S' Lateral: 3 cm

## 2022-08-15 ENCOUNTER — Ambulatory Visit (INDEPENDENT_AMBULATORY_CARE_PROVIDER_SITE_OTHER): Payer: No Typology Code available for payment source | Admitting: Cardiology

## 2022-08-15 ENCOUNTER — Encounter: Payer: Self-pay | Admitting: Cardiology

## 2022-08-15 ENCOUNTER — Ambulatory Visit: Payer: No Typology Code available for payment source | Admitting: Cardiology

## 2022-08-15 VITALS — BP 122/82 | HR 84 | Ht 70.5 in | Wt 220.0 lb

## 2022-08-15 DIAGNOSIS — I2510 Atherosclerotic heart disease of native coronary artery without angina pectoris: Secondary | ICD-10-CM

## 2022-08-15 DIAGNOSIS — E785 Hyperlipidemia, unspecified: Secondary | ICD-10-CM | POA: Diagnosis not present

## 2022-08-15 DIAGNOSIS — I2584 Coronary atherosclerosis due to calcified coronary lesion: Secondary | ICD-10-CM

## 2022-08-15 DIAGNOSIS — E119 Type 2 diabetes mellitus without complications: Secondary | ICD-10-CM

## 2022-08-15 DIAGNOSIS — I25118 Atherosclerotic heart disease of native coronary artery with other forms of angina pectoris: Secondary | ICD-10-CM

## 2022-08-21 ENCOUNTER — Ambulatory Visit: Payer: No Typology Code available for payment source | Admitting: Pulmonary Disease

## 2022-08-22 DIAGNOSIS — K08409 Partial loss of teeth, unspecified cause, unspecified class: Secondary | ICD-10-CM | POA: Diagnosis not present

## 2022-08-23 ENCOUNTER — Encounter: Payer: Self-pay | Admitting: Adult Health

## 2022-08-23 ENCOUNTER — Ambulatory Visit (INDEPENDENT_AMBULATORY_CARE_PROVIDER_SITE_OTHER): Payer: No Typology Code available for payment source | Admitting: Adult Health

## 2022-08-23 DIAGNOSIS — D869 Sarcoidosis, unspecified: Secondary | ICD-10-CM | POA: Diagnosis not present

## 2022-08-23 DIAGNOSIS — J4520 Mild intermittent asthma, uncomplicated: Secondary | ICD-10-CM | POA: Diagnosis not present

## 2022-08-23 DIAGNOSIS — R053 Chronic cough: Secondary | ICD-10-CM | POA: Diagnosis not present

## 2022-08-23 NOTE — Assessment & Plan Note (Signed)
Recent flare now improved.  Continue with trigger prevention and cough control regimen.  Plan  Patient Instructions  Continue on Dulera 2 puffs Twice daily  , rinse after use  Deslym 2 tsp Twice daily  for cough , As needed   Tessalon Three times a day  for cough, as needed  Sips of water to soothe throat, to avoid throat clearing and cough .  Continue on Prilosec 20mg  daily  Continue Pepcid 20mg  At bedtime  Zyrtec 10mg  At bedtime for 2 weeks then as needed for drainage  Continue with Bi-Annual Eye exams.  Follow up with Dr. Erin Fulling or Lexton Hidalgo NP in 3 months and As needed

## 2022-08-23 NOTE — Patient Instructions (Addendum)
Continue on Dulera 2 puffs Twice daily  , rinse after use  Deslym 2 tsp Twice daily  for cough , As needed   Tessalon Three times a day  for cough, as needed  Sips of water to soothe throat, to avoid throat clearing and cough .  Continue on Prilosec 20mg  daily  Continue Pepcid 20mg  At bedtime  Zyrtec 10mg  At bedtime for 2 weeks then as needed for drainage  Continue with Bi-Annual Eye exams.  Follow up with Dr. Erin Fulling or Germaine Shenker NP in 3 months and As needed

## 2022-08-23 NOTE — Assessment & Plan Note (Signed)
Improved with [facility]

## 2022-08-23 NOTE — Progress Notes (Signed)
_0  ID: Quintella Reichert, male    DOB: 11/16/1960, 61 y.o.   MRN: [id number]  Chief Complaint  Patient presents with   Follow-up    Referring provider: No ref. provider found  HPI: 61 year old male never smoker followed for sarcoidosis and chronic cough Diagnosed with sarcoidosis in 2010 treated with steroids for around 1 year and then transition to methotrexate for about 3 years.  Initial diagnosis was with lymph node biopsy positive consistent with sarcoidosis Medical history significant for sleep apnea on CPAP Patient has type 2 diabetes.  Gets biannual ophthalmology follow-ups.  He is former [employer] conservation [employer].  He worked in Special educational needs teacher.  TEST/EVENTS :  June 14, 2022 high-resolution CT chest showed scattered perilymphatic distribution of small solid pulmonary nodules largest measuring 0.5 cm compatible with pulmonary sarcoidosis, negative for interstitial lung disease.  08/23/2022 Follow up : Sarcoid and Chronic cough  Patient presents for a 3 week follow-up.  Patient was seen last visit with a flare of cough that been waxing and waning for several months.  Last visit patient was recommended to begin Prilosec and Pepcid.  Started on Zyrtec.  Also given Delsym and Tessalon for cough control.  Continued on Dulera.  He was also given prednisone 20 mg for 5 days.  Patient says since last visit he is feeling better cough has improved and decreased.  He denies any fever, chest pain, orthopnea. Patient has a history of psoriasis as well.  Has an upcoming appointment with dermatology as he has been having rash areas on knees, elbows and scalp .  Allergies  Allergen Reactions   Latex Swelling   Lisinopril Swelling   Losartan Swelling   Sildenafil Palpitations    Other reaction(s): Headache, Palpitation, Headache, Palpitation, Headache, Palpitations    Immunization History  Administered Date(s) Administered   Influenza-Unspecified 07/28/2006   Moderna Sars-Covid-2  Vaccination 11/24/2019, 12/22/2019, 09/19/2020   Unspecified SARS-COV-2 Vaccination 11/29/2019, 12/30/2019    Past Medical History:  Diagnosis Date   Diabetes mellitus without complication ([place])    Hyperlipidemia    Hypertension    Kidney disease    Sarcoidosis    Stroke ([place])     Tobacco History: Social History   Tobacco Use  Smoking Status Never  Smokeless Tobacco Never   Counseling given: Not Answered   Outpatient Medications Prior to Visit  Medication Sig Dispense Refill   albuterol (VENTOLIN HFA) 108 (90 Base) MCG/ACT inhaler Inhale 1-2 puffs into the lungs every 6 (six) hours as needed for shortness of breath or wheezing.     aspirin EC 81 MG tablet Take 81 mg by mouth at bedtime. Swallow whole.     atorvastatin (LIPITOR) 40 MG tablet Take 20 mg by mouth at bedtime.     benzonatate (TESSALON) 200 MG capsule Take 1 capsule (200 mg total) by mouth 3 (three) times daily as needed. 60 capsule 1   clobetasol cream (TEMOVATE) 0.05 % SMARTSIG:1 Topical Daily     diazepam (VALIUM) 5 MG tablet Take 5 mg by mouth daily as needed for anxiety.     Dulaglutide 1.5 MG/0.5ML SOPN Inject 1.5 mg into the skin every Sunday.     EPINEPHrine 0.3 mg/0.3 mL IJ SOAJ injection Inject 0.3 mg into the muscle as needed for anaphylaxis. 1 each 0   escitalopram (LEXAPRO) 10 MG tablet Take 10 mg by mouth at bedtime.     glimepiride (AMARYL) 4 MG tablet Take 4 mg by mouth every evening.     mometasone-formoterol (DULERA) 100-5  MCG/ACT AERO Inhale 2 puffs into the lungs 2 (two) times daily as needed for wheezing. (Patient taking differently: Inhale 2 puffs into the lungs in the morning and at bedtime.) 1 each 0   traZODone (DESYREL) 100 MG tablet Take 100 mg by mouth at bedtime.     zolpidem (AMBIEN) 10 MG tablet Take 10 mg by mouth at bedtime.     amoxicillin (AMOXIL) 500 MG capsule TAKE FOUR CAPSULES BY MOUTH ONCE 1 HOUR PRIOR TO THE DENTAL TREATMENT (Patient not taking: Reported on 08/15/2022) 4  capsule 3   predniSONE (DELTASONE) 20 MG tablet Take 1 tablet (20 mg total) by mouth daily with breakfast. (Patient not taking: Reported on 08/23/2022) 5 tablet 0   No facility-administered medications prior to visit.     Review of Systems:   Constitutional:   No  weight loss, night sweats,  Fevers, chills, fatigue, or  lassitude.  HEENT:   No headaches,  Difficulty swallowing,  Tooth/dental problems, or  Sore throat,                No sneezing, itching, ear ache, nasal congestion, post nasal drip,   CV:  No chest pain,  Orthopnea, PND, swelling in lower extremities, anasarca, dizziness, palpitations, syncope.   GI  No heartburn, indigestion, abdominal pain, nausea, vomiting, diarrhea, change in bowel habits, loss of appetite, bloody stools.   Resp: No shortness of breath with exertion or at rest.  No excess mucus, no productive cough,  No non-productive cough,  No coughing up of blood.  No change in color of mucus.  No wheezing.  No chest wall deformity  Skin: no rash or lesions.  GU: no dysuria, change in color of urine, no urgency or frequency.  No flank pain, no hematuria   MS:  No joint pain or swelling.  No decreased range of motion.  No back pain.    Physical Exam  BP 126/70 (BP Location: Left Arm, Patient Position: Sitting, Cuff Size: Large)   Pulse 86   Temp 98 F (36.7 C) (Oral)   Ht 5' 10.5" (1.791 m)   Wt 219 lb 3.2 oz (99.4 kg)   SpO2 99%   BMI 31.01 kg/m   GEN: A/Ox3; pleasant , NAD, well nourished    HEENT:  [place]/AT,  EACs-clear, TMs-wnl, NOSE-clear, THROAT-clear, no lesions, no postnasal drip or exudate noted.   NECK:  Supple w/ fair ROM; no JVD; normal carotid impulses w/o bruits; no thyromegaly or nodules palpated; no lymphadenopathy.    RESP  Clear  P & A; w/o, wheezes/ rales/ or rhonchi. no accessory muscle use, no dullness to percussion  CARD:  RRR, no m/r/g, no peripheral edema, pulses intact, no cyanosis or clubbing.  GI:   Soft & nt; nml bowel  sounds; no organomegaly or masses detected.   Musco: Warm bil, no deformities or joint swelling noted.   Neuro: alert, no focal deficits noted.    Skin: Warm, no lesions or rashes    Lab Results:  CBC    Component Value Date/Time   WBC 4.9 07/29/2022 1420   WBC 5.5 01/01/2021 1248   RBC 4.68 07/29/2022 1420   RBC 4.77 01/01/2021 1248   HGB 11.6 (L) 08/01/2022 1323   HGB 14.0 07/29/2022 1420   HCT 34.0 (L) 08/01/2022 1323   HCT 41.9 07/29/2022 1420   PLT 190 07/29/2022 1420   MCV 90 07/29/2022 1420   MCH 29.9 07/29/2022 1420   MCH 29.8 01/01/2021 1248  MCHC 33.4 07/29/2022 1420   MCHC 32.5 01/01/2021 1248   RDW 12.6 07/29/2022 1420   LYMPHSABS 1.8 07/29/2022 1420   MONOABS 0.6 01/01/2021 1248   EOSABS 0.3 07/29/2022 1420   BASOSABS 0.0 07/29/2022 1420    BMET    Component Value Date/Time   NA 142 08/01/2022 1323   NA 141 07/29/2022 1420   K 3.6 08/01/2022 1323   CL 103 07/29/2022 1420   CO2 26 07/29/2022 1420   GLUCOSE 103 (H) 07/29/2022 1420   GLUCOSE 99 01/01/2021 1248   BUN 17 07/29/2022 1420   CREATININE 1.61 (H) 07/29/2022 1420   CALCIUM 9.6 07/29/2022 1420   GFRNONAA 54 (L) 01/01/2021 1248   GFRAA  06/29/2007 1140    >60        The eGFR has been calculated using the MDRD equation. This calculation has not been validated in all clinical    BNP No results found for: "BNP"  ProBNP No results found for: "PROBNP"  Imaging: ECHOCARDIOGRAM COMPLETE  Result Date: 08/14/2022    ECHOCARDIOGRAM REPORT   Patient Name:   ARIEON CORCORAN Date of Exam: 08/14/2022 Medical Rec #:  [id number]             Height:       70.5 in Accession #:    [id number]            Weight:       214.0 lb Date of Birth:  Sep 07, 1961             BSA:          2.160 m Patient Age:    24 years              BP:           120/80 mmHg Patient Gender: M                     HR:           81 bpm. Exam Location:  [facility] Procedure: 2D Echo, 3D Echo, Cardiac Doppler, Color Doppler  and Strain Analysis Indications:    I50.21 CHF  History:        Patient has no prior history of Echocardiogram examinations.                 Cardiomyopathy, Stroke; Risk Factors:Hypertension, Diabetes,                 Dyslipidemia and Family History of Coronary Artery Disease.                 Sarcoidosis.  Sonographer:    Deliah Boston RDCS Referring Phys: Levada Dy NICOLE DUKE IMPRESSIONS  1. Left ventricular ejection fraction, by estimation, is 60 to 65%. The left ventricle has normal function. The left ventricle has no regional wall motion abnormalities. Left ventricular diastolic parameters were normal.  2. Right ventricular systolic function is normal. The right ventricular size is normal.  3. Interatrial septum is hypermobile No PFO seen by color doppler.  4. The mitral valve is normal in structure. Mild mitral valve regurgitation.  5. The aortic valve is normal in structure. Aortic valve regurgitation is not visualized.  6. The inferior vena cava is normal in size with greater than 50% respiratory variability, suggesting right atrial pressure of 3 mmHg. FINDINGS  Left Ventricle: Left ventricular ejection fraction, by estimation, is 60 to 65%. The left ventricle has normal function. The left ventricle has no regional wall  motion abnormalities. The left ventricular internal cavity size was normal in size. There is  no left ventricular hypertrophy. Left ventricular diastolic parameters were normal. Right Ventricle: The right ventricular size is normal. Right vetricular wall thickness was not assessed. Right ventricular systolic function is normal. Left Atrium: Left atrial size was normal in size. Right Atrium: Right atrial size was normal in size. Pericardium: There is no evidence of pericardial effusion. Mitral Valve: The mitral valve is normal in structure. Mild mitral valve regurgitation. Tricuspid Valve: The tricuspid valve is normal in structure. Tricuspid valve regurgitation is trivial. Aortic Valve: The  aortic valve is normal in structure. Aortic valve regurgitation is not visualized. Pulmonic Valve: The pulmonic valve was normal in structure. Pulmonic valve regurgitation is trivial. Aorta: The aortic root and ascending aorta are structurally normal, with no evidence of dilitation. Venous: The inferior vena cava is normal in size with greater than 50% respiratory variability, suggesting right atrial pressure of 3 mmHg. IAS/Shunts: No atrial level shunt detected by color flow Doppler.  LEFT VENTRICLE PLAX 2D LVIDd:         5.20 cm   Diastology LVIDs:         3.00 cm   LV e' medial:    5.33 cm/s LV PW:         1.00 cm   LV E/e' medial:  11.0 LV IVS:        1.10 cm   LV e' lateral:   6.64 cm/s LVOT diam:     2.70 cm   LV E/e' lateral: 8.8 LV SV:         73 LV SV Index:   34        2D Longitudinal Strain LVOT Area:     5.73 cm  2D Strain GLS (A2C):   -15.8 %                          2D Strain GLS (A3C):   -17.3 %                          2D Strain GLS (A4C):   -15.9 %                          2D Strain GLS Avg:     -16.3 %                           3D Volume EF:                          3D EF:        61 %                          LV EDV:       182 ml                          LV ESV:       70 ml                          LV SV:        112 ml RIGHT VENTRICLE RV Basal diam:  4.30 cm RV Mid diam:    4.50  cm RV S prime:     16.90 cm/s TAPSE (M-mode): 2.6 cm LEFT ATRIUM             Index        RIGHT ATRIUM           Index LA diam:        3.90 cm 1.81 cm/m   RA Area:     20.60 cm LA Vol (A2C):   98.5 ml 45.61 ml/m  RA Volume:   58.60 ml  27.13 ml/m LA Vol (A4C):   76.4 ml 35.38 ml/m LA Biplane Vol: 88.7 ml 41.07 ml/m  AORTIC VALVE LVOT Vmax:   66.80 cm/s LVOT Vmean:  44.800 cm/s LVOT VTI:    0.127 m  AORTA Ao Root diam: 3.90 cm Ao Asc diam:  3.70 cm MITRAL VALVE MV Area (PHT): cm         SHUNTS MV Decel Time: 169 msec    Systemic VTI:  0.13 m MV E velocity: 58.70 cm/s  Systemic Diam: 2.70 cm MV A velocity: 84.10 cm/s  MV E/A ratio:  0.70 Dorris Carnes MD Electronically signed by Dorris Carnes MD Signature Date/Time: 08/14/2022/4:36:10 PM    Final    CARDIAC CATHETERIZATION  Result Date: 08/01/2022   Mid LAD lesion is 70% stenosed.   1st Diag lesion is 90% stenosed.   2nd Diag lesion is 90% stenosed.   Prox Cx to Mid Cx lesion is 75% stenosed.   Prox RCA lesion is 40% stenosed.   LV end diastolic pressure is normal. 2 vessel obstructive CAD. There is severe disease in 2 small diagonal vessels. Moderate disease in the mid LAD and mid LCx. Normal LV filling pressures. LVEDP 12 mm Hg. PCWP 12/10 with mean 10 mm Hg. Normal right heart pressures. PAP 22/10 with mean 16 mm Hg Normal cardiac output 7.1 L/min with index 3.33 Plan: will obtain an Echo to better assess LV function. I don't think his degree of CAD can explain that low EF. If low EF is confirmed on Echo will optimize CHF therapy. The patient has no active angina and perfusion on Myoview is Ok so I would not pursue revascularization at this point.   MYOCARDIAL PERFUSION IMAGING  Result Date: 07/25/2022   Findings are consistent with no ischemia. The study is high risk.   No ST deviation was noted.   LV perfusion is normal. There is no evidence of ischemia. There is no evidence of infarction.   Left ventricular function is abnormal. Global function is moderately reduced. Nuclear stress EF: 33 %. The left ventricular ejection fraction is moderately decreased (30-44%). End diastolic cavity size is mildly enlarged. End systolic cavity size is moderately enlarged.   Prior study not available for comparison. Normal perfusion study, but EF measures at 33% and visually appears hypokinetic. Recommend echo to more accurately evaluate wall motion and function.         Latest Ref Rng & Units 06/03/2022   10:04 AM  PFT Results  FVC-Pre L 3.05   FVC-Predicted Pre % 63   FVC-Post L 2.98   FVC-Predicted Post % 61   Pre FEV1/FVC % % 85   Post FEV1/FCV % % 87   FEV1-Pre L 2.59    FEV1-Predicted Pre % 71   FEV1-Post L 2.59   DLCO uncorrected ml/min/mmHg 18.61   DLCO UNC% % 66   DLCO corrected ml/min/mmHg 18.61   DLCO COR %Predicted % 66   DLVA Predicted % 97  TLC L 5.01   TLC % Predicted % 70   RV % Predicted % 75     No results found for: "NITRICOXIDE"      Assessment & Plan:   Chronic cough Recent flare now improved.  Continue with trigger prevention and cough control regimen.  Plan  Patient Instructions  Continue on Dulera 2 puffs Twice daily  , rinse after use  Deslym 2 tsp Twice daily  for cough , As needed   Tessalon Three times a day  for cough, as needed  Sips of water to soothe throat, to avoid throat clearing and cough .  Continue on Prilosec 23m daily  Continue Pepcid 254mAt bedtime  Zyrtec 1062mt bedtime for 2 weeks then as needed for drainage  Continue with Bi-Annual Eye exams.  Follow up with Dr. DewErin Fulling Gracen Southwell NP in 3 months and As needed       Sarcoidosis Appears stable.  Continue with current regimen  Reactive airway disease Improved with DulLarena GlassmanP 08/23/2022

## 2022-08-23 NOTE — Assessment & Plan Note (Signed)
Appears stable.  Continue with current regimen

## 2022-08-30 DIAGNOSIS — H40003 Preglaucoma, unspecified, bilateral: Secondary | ICD-10-CM | POA: Diagnosis not present

## 2022-08-30 DIAGNOSIS — N189 Chronic kidney disease, unspecified: Secondary | ICD-10-CM | POA: Diagnosis not present

## 2022-08-30 DIAGNOSIS — E1122 Type 2 diabetes mellitus with diabetic chronic kidney disease: Secondary | ICD-10-CM | POA: Diagnosis not present

## 2022-08-30 DIAGNOSIS — I2510 Atherosclerotic heart disease of native coronary artery without angina pectoris: Secondary | ICD-10-CM | POA: Diagnosis not present

## 2022-08-30 DIAGNOSIS — E119 Type 2 diabetes mellitus without complications: Secondary | ICD-10-CM | POA: Diagnosis not present

## 2022-08-30 DIAGNOSIS — D860 Sarcoidosis of lung: Secondary | ICD-10-CM | POA: Diagnosis not present

## 2022-08-30 DIAGNOSIS — I129 Hypertensive chronic kidney disease with stage 1 through stage 4 chronic kidney disease, or unspecified chronic kidney disease: Secondary | ICD-10-CM | POA: Diagnosis not present

## 2022-09-02 ENCOUNTER — Ambulatory Visit: Payer: No Typology Code available for payment source | Admitting: Pulmonary Disease

## 2022-10-02 DIAGNOSIS — H409 Unspecified glaucoma: Secondary | ICD-10-CM | POA: Diagnosis not present

## 2022-10-02 DIAGNOSIS — E119 Type 2 diabetes mellitus without complications: Secondary | ICD-10-CM | POA: Diagnosis not present

## 2022-10-02 DIAGNOSIS — H269 Unspecified cataract: Secondary | ICD-10-CM | POA: Diagnosis not present

## 2022-10-02 DIAGNOSIS — H579 Unspecified disorder of eye and adnexa: Secondary | ICD-10-CM | POA: Diagnosis not present

## 2022-10-03 ENCOUNTER — Telehealth: Payer: Self-pay | Admitting: Pulmonary Disease

## 2022-10-03 DIAGNOSIS — R0602 Shortness of breath: Secondary | ICD-10-CM

## 2022-10-03 NOTE — Telephone Encounter (Signed)
Would like his [place] script called into the [facility] in [place]. Fax # [phone]

## 2022-10-04 DIAGNOSIS — K08409 Partial loss of teeth, unspecified cause, unspecified class: Secondary | ICD-10-CM | POA: Diagnosis not present

## 2022-10-04 MED ORDER — MOMETASONE FURO-FORMOTEROL FUM 100-5 MCG/ACT IN AERO: 2.0000 | INHALATION_SPRAY | Freq: Two times a day (BID) | RESPIRATORY_TRACT | 3 refills | Status: DC

## 2022-10-04 NOTE — Telephone Encounter (Signed)
I called and spoke with the pt  He is needing rx for dulera sent to [facility]  Rx was sent  Nothing further needed

## 2022-11-01 DIAGNOSIS — G4730 Sleep apnea, unspecified: Secondary | ICD-10-CM | POA: Diagnosis not present

## 2022-11-01 DIAGNOSIS — H40009 Preglaucoma, unspecified, unspecified eye: Secondary | ICD-10-CM | POA: Diagnosis not present

## 2022-11-01 DIAGNOSIS — E119 Type 2 diabetes mellitus without complications: Secondary | ICD-10-CM | POA: Diagnosis not present

## 2022-11-01 DIAGNOSIS — I2510 Atherosclerotic heart disease of native coronary artery without angina pectoris: Secondary | ICD-10-CM | POA: Diagnosis not present

## 2022-11-08 DIAGNOSIS — K08409 Partial loss of teeth, unspecified cause, unspecified class: Secondary | ICD-10-CM | POA: Diagnosis not present

## 2022-11-25 ENCOUNTER — Ambulatory Visit (INDEPENDENT_AMBULATORY_CARE_PROVIDER_SITE_OTHER): Payer: No Typology Code available for payment source | Admitting: Adult Health

## 2022-11-25 ENCOUNTER — Encounter: Payer: Self-pay | Admitting: Adult Health

## 2022-11-25 VITALS — BP 110/60 | HR 88 | Temp 97.8°F | Ht 70.0 in | Wt 226.8 lb

## 2022-11-25 DIAGNOSIS — K219 Gastro-esophageal reflux disease without esophagitis: Secondary | ICD-10-CM

## 2022-11-25 DIAGNOSIS — J4530 Mild persistent asthma, uncomplicated: Secondary | ICD-10-CM

## 2022-11-25 DIAGNOSIS — R053 Chronic cough: Secondary | ICD-10-CM

## 2022-11-25 DIAGNOSIS — D869 Sarcoidosis, unspecified: Secondary | ICD-10-CM

## 2022-11-25 NOTE — Assessment & Plan Note (Signed)
Continue to control for triggers.  Plan  Patient Instructions  Continue on Dulera 2 puffs Twice daily  , rinse after use  Deslym 2 tsp Twice daily  for cough , As needed   Tessalon Three times a day  for cough, as needed  Sips of water to soothe throat, to avoid throat clearing and cough .  Continue on Prilosec 20mg  daily As needed for reflux  Continue Pepcid 20mg  At bedtime for reflux  Zyrtec 10mg  At bedtime  as needed for drainage  Continue with Bi-Annual Eye exams.  Wear CPAP At bedtime  .  Activity and walking as tolerated.  Follow up with Dr. Erin Fulling or Avanni Turnbaugh NP in 6  months and As needed

## 2022-11-25 NOTE — Patient Instructions (Addendum)
Continue on Dulera 2 puffs Twice daily  , rinse after use  Deslym 2 tsp Twice daily  for cough , As needed   Tessalon Three times a day  for cough, as needed  Sips of water to soothe throat, to avoid throat clearing and cough .  Continue on Prilosec 20mg  daily As needed for reflux  Continue Pepcid 20mg  At bedtime for reflux  Zyrtec 10mg  At bedtime  as needed for drainage  Continue with Bi-Annual Eye exams.  Wear CPAP At bedtime  .  Activity and walking as tolerated.  Follow up with Dr. Erin Fulling or Adara Kittle NP in 6  months and As needed

## 2022-11-25 NOTE — Progress Notes (Signed)
@Patient  ID: , male    DOB: 01/16/1961, 62 y.o.   MRN: [id number]  Chief Complaint  Patient presents with   Follow-up    Referring provider: [id number]  HPI: 62 year old male never smoker followed for sarcoidosis and chronic cough Diagnosed with sarcoidosis in 2010 treated with steroids for around 1 year and then transition to methotrexate for about 3 years.  Initial diagnosis was lymph node biopsies positive-consistent with sarcoidosis Medical history significant for sleep apnea on CPAP [facility] Patient has type 2 diabetes gets biannual ophthalmology exams.  Is former 2011.  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  TEST/EVENTS :  June 14, 2022 high-resolution CT chest showed scattered perilymphatic distribution of small solid pulmonary nodules largest measuring 0.5 cm compatible with pulmonary sarcoidosis, negative for interstitial lung disease.   PFTs June 03, 2022 FEV1 71%, ratio 87, FVC 61%, no significant bronchodilator response, DLCO 66%.  11/25/2022 Follow up: Sarcoid and Chronic cough  Patient presents for a 25-month follow-up.  Patient was seen last visit for flare of chronic cough.  He had been treated with Prilosec and Pepcid along with Zyrtec Delsym and Tessalon for cough control.  Since last visit patient says his cough is still doing well.  He has had no flare of cough or shortness of breath.  Denies any fever, discolored mucus. Says overall he is feeling okay..  Patient is on CPAP at bedtime.  Says he wears his CPAP each night.  He is followed by the [place]   Says he is not very active.  We discussed increasing his activity level.  Encouraged on weight loss.   Allergies  Allergen Reactions   Latex Swelling   Lisinopril Swelling   Losartan Swelling   Sildenafil Palpitations    Other reaction(s): Headache, Palpitation, Headache, Palpitation, Headache, Palpitations    Immunization History  Administered Date(s)  Administered   Influenza-Unspecified 07/28/2006   MMR 09/16/2007   Moderna Sars-Covid-2 Vaccination 11/24/2019, 12/22/2019, 09/19/2020   Tdap 09/16/2007, 11/06/2007   Unspecified SARS-COV-2 Vaccination 11/29/2019, 12/30/2019    Past Medical History:  Diagnosis Date   Diabetes mellitus without complication (HCC)    Hyperlipidemia    Hypertension    Kidney disease    Sarcoidosis    Stroke (HCC)     Tobacco History: Social History   Tobacco Use  Smoking Status Never  Smokeless Tobacco Never   Counseling given: Not Answered   Outpatient Medications Prior to Visit  Medication Sig Dispense Refill   albuterol (VENTOLIN HFA) 108 (90 Base) MCG/ACT inhaler Inhale 1-2 puffs into the lungs every 6 (six) hours as needed for shortness of breath or wheezing.     amoxicillin (AMOXIL) 500 MG capsule TAKE FOUR CAPSULES BY MOUTH ONCE 1 HOUR PRIOR TO THE DENTAL TREATMENT 4 capsule 3   aspirin EC 81 MG tablet Take 81 mg by mouth at bedtime. Swallow whole.     atorvastatin (LIPITOR) 40 MG tablet Take 20 mg by mouth at bedtime.     benzonatate (TESSALON) 200 MG capsule Take 1 capsule (200 mg total) by mouth 3 (three) times daily as needed. 60 capsule 1   clobetasol cream (TEMOVATE) 0.05 % SMARTSIG:1 Topical Daily     diazepam (VALIUM) 5 MG tablet Take 5 mg by mouth daily as needed for anxiety.     Dulaglutide 1.5 MG/0.5ML SOPN Inject 1.5 mg into the skin every Sunday.     EPINEPHrine 0.3 mg/0.3 mL IJ SOAJ injection Inject 0.3  mg into the muscle as needed for anaphylaxis. 1 each 0   escitalopram (LEXAPRO) 10 MG tablet Take 10 mg by mouth at bedtime.     glimepiride (AMARYL) 4 MG tablet Take 4 mg by mouth every evening.     mometasone-formoterol (DULERA) 100-5 MCG/ACT AERO Inhale 2 puffs into the lungs in the morning and at bedtime. 26.4 g 3   predniSONE (DELTASONE) 20 MG tablet Take 1 tablet (20 mg total) by mouth daily with breakfast. 5 tablet 0   traZODone (DESYREL) 100 MG tablet Take 100 mg by  mouth at bedtime.     zolpidem (AMBIEN) 10 MG tablet Take 10 mg by mouth at bedtime.     No facility-administered medications prior to visit.     Review of Systems:   Constitutional:   No  weight loss, night sweats,  Fevers, chills, + fatigue, or  lassitude.  HEENT:   No headaches,  Difficulty swallowing,  Tooth/dental problems, or  Sore throat,                No sneezing, itching, ear ache, nasal congestion, post nasal drip,   CV:  No chest pain,  Orthopnea, PND, swelling in lower extremities, anasarca, dizziness, palpitations, syncope.   GI  No heartburn, indigestion, abdominal pain, nausea, vomiting, diarrhea, change in bowel habits, loss of appetite, bloody stools.   Resp: No shortness of breath with exertion or at rest.  No excess mucus, no productive cough,  No non-productive cough,  No coughing up of blood.  No change in color of mucus.  No wheezing.  No chest wall deformity  Skin: no rash or lesions.  GU: no dysuria, change in color of urine, no urgency or frequency.  No flank pain, no hematuria   MS:  No joint pain or swelling.  No decreased range of motion.  No back pain.    Physical Exam  BP 110/60 (BP Location: Left Arm, Patient Position: Sitting, Cuff Size: Large)   Pulse 88   Temp 97.8 F (36.6 C)   Ht 5\' 10"  (1.778 m)   Wt 226 lb 12.8 oz (102.9 kg)   SpO2 99%   BMI 32.54 kg/m   GEN: A/Ox3; pleasant , NAD, well nourished    HEENT:  /AT,   NOSE-clear, THROAT-clear, no lesions, no postnasal drip or exudate noted.   NECK:  Supple w/ fair ROM; no JVD; normal carotid impulses w/o bruits; no thyromegaly or nodules palpated; no lymphadenopathy.    RESP  Clear  P & A; w/o, wheezes/ rales/ or rhonchi. no accessory muscle use, no dullness to percussion  CARD:  RRR, no m/r/g, tr   peripheral edema, pulses intact, no cyanosis or clubbing.  GI:   Soft & nt; nml bowel sounds; no organomegaly or masses detected.   Musco: Warm bil, no deformities or joint swelling  noted.   Neuro: alert, no focal deficits noted.    Skin: Warm, no lesions or rashes    Lab Results:  CBC    Component Value Date/Time   WBC 4.9 07/29/2022 1420   WBC 5.5 01/01/2021 1248   RBC 4.68 07/29/2022 1420   RBC 4.77 01/01/2021 1248   HGB 11.6 (L) 08/01/2022 1323   HGB 14.0 07/29/2022 1420   HCT 34.0 (L) 08/01/2022 1323   HCT 41.9 07/29/2022 1420   PLT 190 07/29/2022 1420   MCV 90 07/29/2022 1420   MCH 29.9 07/29/2022 1420   MCH 29.8 01/01/2021 1248   MCHC 33.4 07/29/2022  1420   MCHC 32.5 01/01/2021 1248   RDW 12.6 07/29/2022 1420   LYMPHSABS 1.8 07/29/2022 1420   MONOABS 0.6 01/01/2021 1248   EOSABS 0.3 07/29/2022 1420   BASOSABS 0.0 07/29/2022 1420    BMET    Component Value Date/Time   NA 142 08/01/2022 1323   NA 141 07/29/2022 1420   K 3.6 08/01/2022 1323   CL 103 07/29/2022 1420   CO2 26 07/29/2022 1420   GLUCOSE 103 (H) 07/29/2022 1420   GLUCOSE 99 01/01/2021 1248   BUN 17 07/29/2022 1420   CREATININE 1.61 (H) 07/29/2022 1420   CALCIUM 9.6 07/29/2022 1420   GFRNONAA 54 (L) 01/01/2021 1248   GFRAA  06/29/2007 1140    >60        The eGFR has been calculated using the MDRD equation. This calculation has not been validated in all clinical    BNP No results found for: "BNP"  ProBNP No results found for: "PROBNP"  Imaging: No results found.       Latest Ref Rng & Units 06/03/2022   10:04 AM  PFT Results  FVC-Pre L 3.05   FVC-Predicted Pre % 63   FVC-Post L 2.98   FVC-Predicted Post % 61   Pre FEV1/FVC % % 85   Post FEV1/FCV % % 87   FEV1-Pre L 2.59   FEV1-Predicted Pre % 71   FEV1-Post L 2.59   DLCO uncorrected ml/min/mmHg 18.61   DLCO UNC% % 66   DLCO corrected ml/min/mmHg 18.61   DLCO COR %Predicted % 66   DLVA Predicted % 97   TLC L 5.01   TLC % Predicted % 70   RV % Predicted % 75     No results found for: "NITRICOXIDE"      Assessment & Plan:   Sarcoidosis Appears stable.  Continue with ophthalmology exams.   Activity as tolerated.  Plan  Patient Instructions  Continue on Dulera 2 puffs Twice daily  , rinse after use  Deslym 2 tsp Twice daily  for cough , As needed   Tessalon Three times a day  for cough, as needed  Sips of water to soothe throat, to avoid throat clearing and cough .  Continue on Prilosec 20mg  daily As needed for reflux  Continue Pepcid 20mg  At bedtime for reflux  Zyrtec 10mg  At bedtime  as needed for drainage  Continue with Bi-Annual Eye exams.  Wear CPAP At bedtime  .  Activity and walking as tolerated.  Follow up with Dr. or Destry Bezdek NP in 6  months and As needed      Reactive airway disease Reactive airways versus mild asthma.  Seems to be doing well on Dulera.  No changes  Plan  Patient Instructions  Continue on Dulera 2 puffs Twice daily  , rinse after use  Deslym 2 tsp Twice daily  for cough , As needed   Tessalon Three times a day  for cough, as needed  Sips of water to soothe throat, to avoid throat clearing and cough .  Continue on Prilosec 20mg  daily As needed for reflux  Continue Pepcid 20mg  At bedtime for reflux  Zyrtec 10mg  At bedtime  as needed for drainage  Continue with Bi-Annual Eye exams.  Wear CPAP At bedtime  .  Activity and walking as tolerated.  Follow up with Dr. or Kaiser Belluomini NP in 6  months and As needed      GERD (gastroesophageal reflux disease) Continue on current therapy  Chronic cough Continue to control for triggers.  Plan  Patient Instructions  Continue on Dulera 2 puffs Twice daily  , rinse after use  Deslym 2 tsp Twice daily  for cough , As needed   Tessalon Three times a day  for cough, as needed  Sips of water to soothe throat, to avoid throat clearing and cough .  Continue on Prilosec 20mg  daily As needed for reflux  Continue Pepcid 20mg  At bedtime for reflux  Zyrtec 10mg  At bedtime  as needed for drainage  Continue with Bi-Annual Eye exams.  Wear CPAP At bedtime  .  Activity and walking as tolerated.   Follow up with Dr. Erin Fulling or Gordie Crumby NP in 6  months and As needed        Rexene Edison, NP 11/25/2022

## 2022-11-25 NOTE — Assessment & Plan Note (Signed)
Appears stable.  Continue with ophthalmology exams.  Activity as tolerated.  Plan  Patient Instructions  Continue on Dulera 2 puffs Twice daily  , rinse after use  Deslym 2 tsp Twice daily  for cough , As needed   Tessalon Three times a day  for cough, as needed  Sips of water to soothe throat, to avoid throat clearing and cough .  Continue on Prilosec 20mg  daily As needed for reflux  Continue Pepcid 20mg  At bedtime for reflux  Zyrtec 10mg  At bedtime  as needed for drainage  Continue with Bi-Annual Eye exams.  Wear CPAP At bedtime  .  Activity and walking as tolerated.  Follow up with Dr. Erin Fulling or Lenix Kidd NP in 6  months and As needed

## 2022-11-25 NOTE — Assessment & Plan Note (Signed)
Continue on current therapy 

## 2022-11-25 NOTE — Assessment & Plan Note (Signed)
Reactive airways versus mild asthma.  Seems to be doing well on Dulera.  No changes  Plan  Patient Instructions  Continue on Dulera 2 puffs Twice daily  , rinse after use  Deslym 2 tsp Twice daily  for cough , As needed   Tessalon Three times a day  for cough, as needed  Sips of water to soothe throat, to avoid throat clearing and cough .  Continue on Prilosec 20mg  daily As needed for reflux  Continue Pepcid 20mg  At bedtime for reflux  Zyrtec 10mg  At bedtime  as needed for drainage  Continue with Bi-Annual Eye exams.  Wear CPAP At bedtime  .  Activity and walking as tolerated.  Follow up with Dr. Erin Fulling or Maryanna Stuber NP in 6  months and As needed

## 2022-12-05 DIAGNOSIS — E291 Testicular hypofunction: Secondary | ICD-10-CM | POA: Diagnosis not present

## 2022-12-05 DIAGNOSIS — N5201 Erectile dysfunction due to arterial insufficiency: Secondary | ICD-10-CM | POA: Diagnosis not present

## 2022-12-05 DIAGNOSIS — Z125 Encounter for screening for malignant neoplasm of prostate: Secondary | ICD-10-CM | POA: Diagnosis not present

## 2022-12-11 ENCOUNTER — Telehealth: Payer: Self-pay | Admitting: Cardiology

## 2022-12-11 DIAGNOSIS — I25118 Atherosclerotic heart disease of native coronary artery with other forms of angina pectoris: Secondary | ICD-10-CM

## 2022-12-11 DIAGNOSIS — I2510 Atherosclerotic heart disease of native coronary artery without angina pectoris: Secondary | ICD-10-CM

## 2022-12-11 NOTE — Telephone Encounter (Signed)
Patient stated he would like to get cardiac rehab set up through the [place] and would like a call back to discuss next steps.

## 2022-12-11 NOTE — Telephone Encounter (Signed)
Left message to call back   [facility] to know [facility] Phone[facility]fax

## 2022-12-17 NOTE — Telephone Encounter (Signed)
Patient calling to follow up on cardiac rehab referral and whether that is something he would need.

## 2022-12-17 NOTE — Telephone Encounter (Signed)
Spoke with pt, he is aware he can participate in cardiac rehab. He reports the [place] will help with rehab and he will get the referral

## 2022-12-18 DIAGNOSIS — K08409 Partial loss of teeth, unspecified cause, unspecified class: Secondary | ICD-10-CM | POA: Diagnosis not present

## 2022-12-23 DIAGNOSIS — E291 Testicular hypofunction: Secondary | ICD-10-CM | POA: Diagnosis not present

## 2022-12-26 DIAGNOSIS — E1122 Type 2 diabetes mellitus with diabetic chronic kidney disease: Secondary | ICD-10-CM | POA: Diagnosis not present

## 2022-12-26 DIAGNOSIS — N189 Chronic kidney disease, unspecified: Secondary | ICD-10-CM | POA: Diagnosis not present

## 2022-12-27 ENCOUNTER — Telehealth: Payer: Self-pay | Admitting: Adult Health

## 2022-12-27 DIAGNOSIS — R0602 Shortness of breath: Secondary | ICD-10-CM

## 2022-12-27 MED ORDER — MOMETASONE FURO-FORMOTEROL FUM 100-5 MCG/ACT IN AERO: 2.0000 | INHALATION_SPRAY | Freq: Two times a day (BID) | RESPIRATORY_TRACT | 11 refills | Status: AC

## 2022-12-27 NOTE — Telephone Encounter (Signed)
Called patient and verified that he was needing Dulera. Verified pharmacy over the phone. Nothing further needed

## 2022-12-27 NOTE — Telephone Encounter (Signed)
Patient would like refill for [facility] inhaler. Pharmacy is [facility]. Patient phone number is [phone].

## 2023-01-03 ENCOUNTER — Telehealth (HOSPITAL_COMMUNITY): Payer: Self-pay

## 2023-01-03 NOTE — Telephone Encounter (Signed)
Called patient to see if he was interested in participating in the Cardiac Rehab Program. Patient stated yes. Patient will come in for orientation on 01/07/23 @ 930AM and will attend the 1145AM exercise class.

## 2023-01-06 ENCOUNTER — Telehealth (HOSPITAL_COMMUNITY): Payer: Self-pay

## 2023-01-06 NOTE — Telephone Encounter (Signed)
RN called patient to confirm Cardiac Rehab Appointment tomorrow at 0930. RN also able to complete the CR Nursing Assessment with patient. Directions and instructions given to patient about his appointment tomorrow. Pt understands without assistance.

## 2023-01-07 ENCOUNTER — Encounter (HOSPITAL_COMMUNITY): Payer: Self-pay

## 2023-01-07 ENCOUNTER — Encounter (HOSPITAL_COMMUNITY)
Admission: RE | Admit: 2023-01-07 | Discharge: 2023-01-07 | Disposition: A | Discharge status: Home / Self Care | Payer: No Typology Code available for payment source | Source: Ambulatory Visit | Attending: Cardiology | Admitting: Cardiology

## 2023-01-07 VITALS — BP 108/70 | Ht 71.25 in | Wt 222.4 lb

## 2023-01-07 DIAGNOSIS — I2089 Other forms of angina pectoris: Secondary | ICD-10-CM

## 2023-01-07 DIAGNOSIS — Z48812 Encounter for surgical aftercare following surgery on the circulatory system: Secondary | ICD-10-CM | POA: Insufficient documentation

## 2023-01-07 DIAGNOSIS — I259 Chronic ischemic heart disease, unspecified: Secondary | ICD-10-CM | POA: Insufficient documentation

## 2023-01-07 HISTORY — DX: Atherosclerotic heart disease of native coronary artery without angina pectoris: I25.10

## 2023-01-07 LAB — GLUCOSE, CAPILLARY: Glucose-Capillary: 109 mg/dL — ABNORMAL HIGH (ref 70–99)

## 2023-01-07 NOTE — Progress Notes (Signed)
Cardiac Individual Treatment Plan  Patient Details  Name: Brendan [name] MRN: [id number] Date of Birth: June 24, 1961 Referring Provider:   Flowsheet Row INTENSIVE CARDIAC REHAB ORIENT from 01/07/2023 in [facility] [place]  Referring Provider Peter Martinique, MD       Initial Encounter Date:  [facility] from 01/07/2023 in [facility]  Date 01/07/23       Visit Diagnosis: Chronic stable angina  Patient's Home Medications on Admission:  Current Outpatient Medications:    albuterol (VENTOLIN HFA) 108 (90 Base) MCG/ACT inhaler, Inhale 1-2 puffs into the lungs every 6 (six) hours as needed for shortness of breath or wheezing., Disp: , Rfl:    amoxicillin (AMOXIL) 500 MG capsule, TAKE FOUR CAPSULES BY MOUTH ONCE 1 HOUR PRIOR TO THE DENTAL TREATMENT, Disp: 4 capsule, Rfl: 3   aspirin EC 81 MG tablet, Take 81 mg by mouth at bedtime. Swallow whole., Disp: , Rfl:    atorvastatin (LIPITOR) 40 MG tablet, Take 20 mg by mouth at bedtime., Disp: , Rfl:    benzonatate (TESSALON) 200 MG capsule, Take 1 capsule (200 mg total) by mouth 3 (three) times daily as needed., Disp: 60 capsule, Rfl: 1   clobetasol cream (TEMOVATE) 0.05 %, SMARTSIG:1 Topical Daily, Disp: , Rfl:    diazepam (VALIUM) 5 MG tablet, Take 5 mg by mouth daily as needed for anxiety., Disp: , Rfl:    EPINEPHrine 0.3 mg/0.3 mL IJ SOAJ injection, Inject 0.3 mg into the muscle as needed for anaphylaxis., Disp: 1 each, Rfl: 0   escitalopram (LEXAPRO) 10 MG tablet, Take 10 mg by mouth at bedtime., Disp: , Rfl:    glimepiride (AMARYL) 4 MG tablet, Take 4 mg by mouth every evening., Disp: , Rfl:    mometasone-formoterol (DULERA) 100-5 MCG/ACT AERO, Inhale 2 puffs into the lungs in the morning and at bedtime., Disp: 26.4 g, Rfl: 11   Semaglutide,0.25 or 0.'5MG'$ /DOS, 2 MG/3ML SOPN, Inject 0.25 mg into the  skin once a week., Disp: , Rfl:    traZODone (DESYREL) 100 MG tablet, Take 100 mg by mouth at bedtime., Disp: , Rfl:    zolpidem (AMBIEN) 10 MG tablet, Take 10 mg by mouth at bedtime., Disp: , Rfl:   Past Medical History: Past Medical History:  Diagnosis Date   Coronary artery disease    Diabetes mellitus without complication ([place])    Hyperlipidemia    Hypertension    Kidney disease    Sarcoidosis    Stroke ([place])     Tobacco Use: Social History   Tobacco Use  Smoking Status Never  Smokeless Tobacco Never    Labs: Review Flowsheet       Latest Ref Rng & Units 04/03/2008 08/01/2022  Labs for ITP Cardiac and Pulmonary Rehab  PH, Arterial 7.35 - 7.45 - 7.398   PCO2 arterial 32 - 48 mmHg - 37.1   Bicarbonate 20.0 - 28.0 mmol/L - 23.3  24.6  23.1  22.8   TCO2 22 - 32 mmol/L '25  25  26  24  24   '$ Acid-base deficit 0.0 - 2.0 mmol/L - 2.0  1.0  2.0  2.0   O2 Saturation % - 79  75  81  98     Capillary Blood Glucose: Lab Results  Component Value Date   GLUCAP 109 (H) 01/07/2023   GLUCAP 68 (L) 08/01/2022   GLUCAP 220 (  H) 01/01/2021     Exercise Target Goals: Exercise Program Goal: Individual exercise prescription set using results from initial 6 min walk test and THRR while considering  patient's activity barriers and safety.   Exercise Prescription Goal: Initial exercise prescription builds to 30-45 minutes a day of aerobic activity, 2-3 days per week.  Home exercise guidelines will be given to patient during program as part of exercise prescription that the participant will acknowledge.  Activity Barriers & Risk Stratification:  Activity Barriers & Cardiac Risk Stratification - 01/07/23 1311       Activity Barriers & Cardiac Risk Stratification   Activity Barriers Left Knee Replacement;Right Knee Replacement;Deconditioning;Back Problems;Balance Concerns;Shortness of Breath;Joint Problems    Cardiac Risk Stratification High             6 Minute Walk:  6 Minute  Walk     Row Name 01/07/23 1043         6 Minute Walk   Phase Initial     Distance 1410 feet     Walk Time 6 minutes     # of Rest Breaks 0     MPH 2.7     METS 3.63     RPE 10     Perceived Dyspnea  0     VO2 Peak 12.7     Symptoms No     Resting HR 81 bpm     Resting BP 108/70     Resting Oxygen Saturation  98 %     Exercise Oxygen Saturation  during 6 min walk 98 %     Max Ex. HR 119 bpm     Max Ex. BP 138/60     2 Minute Post BP 112/62       Interval HR   1 Minute HR 92     2 Minute HR 107     3 Minute HR 111     4 Minute HR 113     5 Minute HR 116     6 Minute HR 117     Interval Heart Rate? Yes       Interval Oxygen   Interval Oxygen? Yes     Baseline Oxygen Saturation % 98 %     1 Minute Oxygen Saturation % 94 %     1 Minute Liters of Oxygen 0 L     2 Minute Oxygen Saturation % 94 %     2 Minute Liters of Oxygen 0 L     3 Minute Oxygen Saturation % 94 %     3 Minute Liters of Oxygen 0 L     4 Minute Oxygen Saturation % 93 %     4 Minute Liters of Oxygen 0 L     5 Minute Oxygen Saturation % 96 %     5 Minute Liters of Oxygen 0 L     6 Minute Oxygen Saturation % 98 %     6 Minute Liters of Oxygen 0 L     2 Minute Post Oxygen Saturation % 100 %              Oxygen Initial Assessment:   Oxygen Re-Evaluation:   Oxygen Discharge (Final Oxygen Re-Evaluation):   Initial Exercise Prescription:  Initial Exercise Prescription - 01/07/23 1300       Date of Initial Exercise RX and Referring Provider   Date 01/07/23    Referring Provider Peter Martinique, MD    Expected Discharge Date 03/21/23  Recumbant Bike   Level 2    RPM 60    Watts 52    Minutes 15    METs 3.6      Arm Ergometer   Level 1.5    Watts 64    RPM 60    Minutes 15    METs 3.6      Prescription Details   Frequency (times per week) 3    Duration Progress to 30 minutes of continuous aerobic without signs/symptoms of physical distress      Intensity   THRR 40-80% of  Max Heartrate 63-126    Ratings of Perceived Exertion 11-13    Perceived Dyspnea 0-4      Progression   Progression Continue progressive overload as per policy without signs/symptoms or physical distress.      Resistance Training   Training Prescription Yes    Weight 4 lbs    Reps 10-15             Perform Capillary Blood Glucose checks as needed.  Exercise Prescription Changes:   Exercise Comments:   Exercise Goals and Review:   Exercise Goals     Row Name 01/07/23 1314             Exercise Goals   Increase Physical Activity Yes       Intervention Provide advice, education, support and counseling about physical activity/exercise needs.;Develop an individualized exercise prescription for aerobic and resistive training based on initial evaluation findings, risk stratification, comorbidities and participant's personal goals.       Expected Outcomes Short Term: Attend rehab on a regular basis to increase amount of physical activity.;Long Term: Add in home exercise to make exercise part of routine and to increase amount of physical activity.;Long Term: Exercising regularly at least 3-5 days a week.       Increase Strength and Stamina Yes       Intervention Provide advice, education, support and counseling about physical activity/exercise needs.;Develop an individualized exercise prescription for aerobic and resistive training based on initial evaluation findings, risk stratification, comorbidities and participant's personal goals.       Expected Outcomes Short Term: Increase workloads from initial exercise prescription for resistance, speed, and METs.;Short Term: Perform resistance training exercises routinely during rehab and add in resistance training at home;Long Term: Improve cardiorespiratory fitness, muscular endurance and strength as measured by increased METs and functional capacity (6MWT)       Able to understand and use rate of perceived exertion (RPE) scale Yes        Intervention Provide education and explanation on how to use RPE scale       Expected Outcomes Short Term: Able to use RPE daily in rehab to express subjective intensity level;Long Term:  Able to use RPE to guide intensity level when exercising independently       Knowledge and understanding of Target Heart Rate Range (THRR) Yes       Intervention Provide education and explanation of THRR including how the numbers were predicted and where they are located for reference       Expected Outcomes Short Term: Able to state/look up THRR;Short Term: Able to use daily as guideline for intensity in rehab;Long Term: Able to use THRR to govern intensity when exercising independently       Understanding of Exercise Prescription Yes       Intervention Provide education, explanation, and written materials on patient's individual exercise prescription       Expected Outcomes  Short Term: Able to explain program exercise prescription;Long Term: Able to explain home exercise prescription to exercise independently                Exercise Goals Re-Evaluation :   Discharge Exercise Prescription (Final Exercise Prescription Changes):   Nutrition:  Target Goals: Understanding of nutrition guidelines, daily intake of sodium '1500mg'$ , cholesterol '200mg'$ , calories 30% from fat and 7% or less from saturated fats, daily to have 5 or more servings of fruits and vegetables.  Biometrics:  Pre Biometrics - 01/07/23 0945       Pre Biometrics   Waist Circumference 41.75 inches    Hip Circumference 41.5 inches    Waist to Hip Ratio 1.01 %    Triceps Skinfold 18 mm    % Body Fat 29.7 %    Grip Strength 54 kg    Flexibility 11.75 in    Single Leg Stand 13.43 seconds              Nutrition Therapy Plan and Nutrition Goals:   Nutrition Assessments:  MEDIFICTS Score Key: ?70 Need to make dietary changes  40-70 Heart Healthy Diet ? 40 Therapeutic Level Cholesterol Diet    Picture Your Plate  Scores: [id number] Unhealthy dietary pattern with much room for improvement. 41-50 Dietary pattern unlikely to meet recommendations for good health and room for improvement. 51-60 More healthful dietary pattern, with some room for improvement.  >60 Healthy dietary pattern, although there may be some specific behaviors that could be improved.    Nutrition Goals Re-Evaluation:   Nutrition Goals Re-Evaluation:   Nutrition Goals Discharge (Final Nutrition Goals Re-Evaluation):   Psychosocial: Target Goals: Acknowledge presence or absence of significant depression and/or stress, maximize coping skills, provide positive support system. Participant is able to verbalize types and ability to use techniques and skills needed for reducing stress and depression.  Initial Review & Psychosocial Screening:  Initial Psych Review & Screening - 01/07/23 1448       Initial Review   Current issues with Current Depression;History of Depression;Current Anxiety/Panic      Family Dynamics   Good Support System? Yes   Josph Macho has his wife and his wifes family for support who live in the area   Comments Josph Macho admits to suffering from severe depression. Josph Macho takes an antidepressant and receives counselling every 6 months. Fred's next counselling session will [facility] on April [facility]th at [facility]      Barriers   Psychosocial barriers to participate in program The patient should benefit from training in stress management and relaxation.      Screening Interventions   Interventions Encouraged to exercise;Provide feedback about the scores to participant    Expected Outcomes Long Term Goal: Stressors or current issues are controlled or eliminated.;Short Term goal: Identification and review with participant of any Quality of Life or Depression concerns found by scoring the questionnaire.;Long Term goal: The participant improves quality of Life and PHQ9 Scores as seen by post scores and/or verbalization of changes              Quality of Life Scores:  Quality of Life - 01/07/23 1315       Quality of Life   Select Quality of Life      Quality of Life Scores   Health/Function Pre 17.1 %    Socioeconomic Pre 23.06 %    Psych/Spiritual Pre 25.71 %    Family Pre 28.3 %    GLOBAL Pre 21.79 %  Scores of 19 and below usually indicate a poorer quality of life in these areas.  A difference of  2-3 points is a clinically meaningful difference.  A difference of 2-3 points in the total score of the Quality of Life Index has been associated with significant improvement in overall quality of life, self-image, physical symptoms, and general health in studies assessing change in quality of life.  PHQ-9: Review Flowsheet       01/07/2023  Depression screen PHQ 2/9  Decreased Interest 3  Down, Depressed, Hopeless 2  PHQ - 2 Score 5  Altered sleeping 3  Tired, decreased energy 3  Change in appetite 2  Feeling bad or failure about yourself  2  Trouble concentrating 3  Moving slowly or fidgety/restless 1  Suicidal thoughts 0  PHQ-9 Score 19  Difficult doing work/chores Somewhat difficult   Interpretation of Total Score  Total Score Depression Severity:  1-4 = Minimal depression, 5-9 = Mild depression, 10-14 = Moderate depression, 15-19 = Moderately severe depression, 20-27 = Severe depression   Psychosocial Evaluation and Intervention:   Psychosocial Re-Evaluation:   Psychosocial Discharge (Final Psychosocial Re-Evaluation):   Vocational Rehabilitation: Provide vocational rehab assistance to qualifying candidates.   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 Participant will state understanding/return demonstration of topics presented.     Core Videos: Exercise    Move It!  Clinical staff conducted group or individual video education with verbal and written material and guidebook.  Patient learns the recommended Pritikin exercise program. Exercise with the goal of living a long, healthy life. Some of the health benefits of exercise include controlled diabetes, healthier blood pressure levels, improved cholesterol levels, improved heart and lung capacity, improved sleep, and better body composition. Everyone should speak with their doctor before starting or changing an exercise routine.  Biomechanical Limitations Clinical staff conducted group or individual video education with verbal and written material and guidebook.  Patient learns how biomechanical limitations can impact exercise and how we can mitigate and possibly overcome limitations to have an impactful and balanced exercise routine.  Body Composition Clinical staff conducted group or individual video education with verbal and written material and guidebook.  Patient learns that body composition (ratio of muscle mass to fat mass) is a key component to assessing overall fitness, rather than body weight alone. Increased fat mass, especially visceral belly fat, can put [place] at increased risk for metabolic syndrome, type 2 diabetes, heart disease, and even death. It is recommended to combine diet and exercise (cardiovascular and resistance training) to improve your body composition. Seek guidance from your physician and exercise physiologist before implementing an exercise routine.  Exercise Action Plan Clinical staff conducted group or individual video education with verbal and written material and guidebook.  Patient learns the recommended strategies to achieve and enjoy long-term exercise adherence, including variety, self-motivation, self-efficacy, and positive  decision making. Benefits of exercise include fitness, good health, weight management, more energy, better sleep, less stress, and overall well-being.  Medical   Heart Disease Risk Reduction Clinical staff conducted group or individual video education with verbal and  written material and guidebook.  Patient learns our heart is our most vital organ as it circulates oxygen, nutrients, white blood cells, and hormones throughout the entire body, and carries waste away. Data supports a plant-based eating plan like the Pritikin Program for its effectiveness in slowing progression of and reversing heart disease. The video provides a number of recommendations to address heart disease.   Metabolic Syndrome and Belly Fat  Clinical staff conducted group or individual video education with verbal and written material and guidebook.  Patient learns what metabolic syndrome is, how it leads to heart disease, and how one can reverse it and keep it from coming back. You have metabolic syndrome if you have 3 of the following 5 criteria: abdominal obesity, high blood pressure, high triglycerides, low HDL cholesterol, and high blood sugar.  Hypertension and Heart Disease Clinical staff conducted group or individual video education with verbal and written material and guidebook.  Patient learns that high blood pressure, or hypertension, is very common in the [place]. Hypertension is largely due to excessive salt intake, but other important risk factors include being overweight, physical inactivity, drinking too much alcohol, smoking, and not eating enough potassium from fruits and vegetables. High blood pressure is a leading risk factor for heart attack, stroke, congestive heart failure, dementia, kidney failure, and premature death. Long-term effects of excessive salt intake include stiffening of the arteries and thickening of heart muscle and organ damage. Recommendations include ways to reduce hypertension and the  risk of heart disease.  Diseases of Our Time - Focusing on Diabetes Clinical staff conducted group or individual video education with verbal and written material and guidebook.  Patient learns why the best way to stop diseases of our time is prevention, through food and other lifestyle changes. Medicine (such as prescription pills and surgeries) is often only a Band-Aid on the problem, not a long-term solution. Most common diseases of our time include obesity, type 2 diabetes, hypertension, heart disease, and cancer. The Pritikin Program is recommended and has been proven to help reduce, reverse, and/or prevent the damaging effects of metabolic syndrome.  Nutrition   Overview of the Pritikin Eating Plan  Clinical staff conducted group or individual video education with verbal and written material and guidebook.  Patient learns about the [place] for disease risk reduction. The [place] emphasizes a wide variety of unrefined, minimally-processed carbohydrates, like fruits, vegetables, whole grains, and legumes. Go, Caution, and Stop food choices are explained. Plant-based and lean animal proteins are emphasized. Rationale provided for low sodium intake for blood pressure control, low added sugars for blood sugar stabilization, and low added fats and oils for coronary artery disease risk reduction and weight management.  Calorie Density  Clinical staff conducted group or individual video education with verbal and written material and guidebook.  Patient learns about calorie density and how it impacts the Pritikin Eating Plan. Knowing the characteristics of the food you choose will help you decide whether those foods will lead to weight gain or weight loss, and whether you want to consume more or less of them. Weight loss is usually a side effect of the Pritikin Eating Plan because of its focus on low calorie-dense foods.  Label Reading  Clinical staff conducted group or  individual video education with verbal and written material and guidebook.  Patient learns about the Pritikin recommended label reading guidelines and corresponding recommendations regarding calorie density, added sugars, sodium content, and whole grains.  Dining Out - Part  1  Clinical staff conducted group or individual video education with verbal and written material and guidebook.  Patient learns that restaurant meals can be sabotaging because they can be so high in calories, fat, sodium, and/or sugar. Patient learns recommended strategies on how to positively address this and avoid unhealthy pitfalls.  Facts on Fats  Clinical staff conducted group or individual video education with verbal and written material and guidebook.  Patient learns that lifestyle modifications can be just as effective, if not more so, as many medications for lowering your risk of heart disease. A Pritikin lifestyle can help to reduce your risk of inflammation and atherosclerosis (cholesterol build-up, or plaque, in the artery walls). Lifestyle interventions such as dietary choices and physical activity address the cause of atherosclerosis. A review of the types of fats and their impact on blood cholesterol levels, along with dietary recommendations to reduce fat intake is also included.  Nutrition Action Plan  Clinical staff conducted group or individual video education with verbal and written material and guidebook.  Patient learns how to incorporate Pritikin recommendations into their lifestyle. Recommendations include planning and keeping personal health goals in mind as an important part of their success.  Healthy Mind-Set    Healthy Minds, Bodies, Hearts  Clinical staff conducted group or individual video education with verbal and written material and guidebook.  Patient learns how to identify when they are stressed. Video will discuss the impact of that stress, as well as the many benefits of stress management.  Patient will also be introduced to stress management techniques. The way we think, act, and feel has an impact on our hearts.  How Our Thoughts Can Heal Our Hearts  Clinical staff conducted group or individual video education with verbal and written material and guidebook.  Patient learns that negative thoughts can cause depression and anxiety. This can result in negative lifestyle behavior and serious health problems. Cognitive behavioral therapy is an effective method to help control our thoughts in order to change and improve our emotional outlook.  Additional Videos:  Exercise    Improving Performance  Clinical staff conducted group or individual video education with verbal and written material and guidebook.  Patient learns to use a non-linear approach by alternating intensity levels and lengths of time spent exercising to help burn more calories and lose more body fat. Cardiovascular exercise helps improve heart health, metabolism, hormonal balance, blood sugar control, and recovery from fatigue. Resistance training improves strength, endurance, balance, coordination, reaction time, metabolism, and muscle mass. Flexibility exercise improves circulation, posture, and balance. Seek guidance from your physician and exercise physiologist before implementing an exercise routine and learn your capabilities and proper form for all exercise.  Introduction to Yoga  Clinical staff conducted group or individual video education with verbal and written material and guidebook.  Patient learns about yoga, a discipline of the coming together of mind, breath, and body. The benefits of yoga include improved flexibility, improved range of motion, better posture and core strength, increased lung function, weight loss, and positive self-image. Yoga's heart health benefits include lowered blood pressure, healthier heart rate, decreased cholesterol and triglyceride levels, improved immune function, and reduced stress.  Seek guidance from your physician and exercise physiologist before implementing an exercise routine and learn your capabilities and proper form for all exercise.  Medical   Aging: Enhancing Your Quality of Life  Clinical staff conducted group or individual video education with verbal and written material and guidebook.  Patient learns key strategies and recommendations  to stay in good physical health and enhance quality of life, such as prevention strategies, having an advocate, securing a [place], and keeping a list of medications and system for tracking them. It also discusses how to avoid risk for bone loss.  Biology of Weight Control  Clinical staff conducted group or individual video education with verbal and written material and guidebook.  Patient learns that weight gain occurs because we consume more calories than we burn (eating more, moving less). Even if your body weight is normal, you may have higher ratios of fat compared to muscle mass. Too much body fat puts you at increased risk for cardiovascular disease, heart attack, stroke, type 2 diabetes, and obesity-related cancers. In addition to exercise, following the [place] can help reduce your risk.  Decoding Lab Results  Clinical staff conducted group or individual video education with verbal and written material and guidebook.  Patient learns that lab test reflects one measurement whose values change over time and are influenced by many factors, including medication, stress, sleep, exercise, food, hydration, pre-existing medical conditions, and more. It is recommended to use the knowledge from this video to become more involved with your lab results and evaluate your numbers to speak with your doctor.   Diseases of Our Time - Overview  Clinical staff conducted group or individual video education with verbal and written material and guidebook.  Patient learns that according to the CDC, 50%  to 70% of chronic diseases (such as obesity, type 2 diabetes, elevated lipids, hypertension, and heart disease) are avoidable through lifestyle improvements including healthier food choices, listening to satiety cues, and increased physical activity.  Sleep Disorders Clinical staff conducted group or individual video education with verbal and written material and guidebook.  Patient learns how good quality and duration of sleep are important to overall health and well-being. Patient also learns about sleep disorders and how they impact health along with recommendations to address them, including discussing with a physician.  Nutrition  Dining Out - Part 2 Clinical staff conducted group or individual video education with verbal and written material and guidebook.  Patient learns how to plan ahead and communicate in order to maximize their dining experience in a healthy and nutritious manner. Included are recommended food choices based on the type of restaurant the patient is visiting.   Fueling a Best boy conducted group or individual video education with verbal and written material and guidebook.  There is a strong connection between our food choices and our health. Diseases like obesity and type 2 diabetes are very prevalent and are in large-part due to lifestyle choices. The Pritikin Eating Plan provides plenty of food and hunger-curbing satisfaction. It is easy to follow, affordable, and helps reduce health risks.  Menu Workshop  Clinical staff conducted group or individual video education with verbal and written material and guidebook.  Patient learns that restaurant meals can sabotage health goals because they are often packed with calories, fat, sodium, and sugar. Recommendations include strategies to plan ahead and to communicate with the manager, chef, or server to help order a healthier meal.  Planning Your Eating Strategy  Clinical staff conducted group or individual  video education with verbal and written material and guidebook.  Patient learns about the [place] and its benefit of reducing the risk of disease. The [place] does not focus on calories. Instead, it emphasizes high-quality, nutrient-rich foods. By knowing the characteristics of  the foods, we choose, we can determine their calorie density and make informed decisions.  Targeting Your Nutrition Priorities  Clinical staff conducted group or individual video education with verbal and written material and guidebook.  Patient learns that lifestyle habits have a tremendous impact on disease risk and progression. This video provides eating and physical activity recommendations based on your personal health goals, such as reducing LDL cholesterol, losing weight, preventing or controlling type 2 diabetes, and reducing high blood pressure.  Vitamins and Minerals  Clinical staff conducted group or individual video education with verbal and written material and guidebook.  Patient learns different ways to obtain key vitamins and minerals, including through a recommended healthy diet. It is important to discuss all supplements you take with your doctor.   Healthy Mind-Set    Smoking Cessation  Clinical staff conducted group or individual video education with verbal and written material and guidebook.  Patient learns that cigarette smoking and tobacco addiction pose a serious health risk which affects millions of people. Stopping smoking will significantly reduce the risk of heart disease, lung disease, and many forms of cancer. Recommended strategies for quitting are covered, including working with your doctor to develop a successful plan.  Culinary   Becoming a Financial trader conducted group or individual video education with verbal and written material and guidebook.  Patient learns that cooking at home can be healthy, cost-effective, quick, and puts them in control.  Keys to cooking healthy recipes will include looking at your recipe, assessing your equipment needs, planning ahead, making it simple, choosing cost-effective seasonal ingredients, and limiting the use of added fats, salts, and sugars.  Cooking - Breakfast and Snacks  Clinical staff conducted group or individual video education with verbal and written material and guidebook.  Patient learns how important breakfast is to satiety and nutrition through the entire day. Recommendations include key foods to eat during breakfast to help stabilize blood sugar levels and to prevent overeating at meals later in the day. Planning ahead is also a key component.  Cooking - Human resources officer conducted group or individual video education with verbal and written material and guidebook.  Patient learns eating strategies to improve overall health, including an approach to cook more at home. Recommendations include thinking of animal protein as a side on your plate rather than center stage and focusing instead on lower calorie dense options like vegetables, fruits, whole grains, and plant-based proteins, such as beans. Making sauces in large quantities to freeze for later and leaving the skin on your vegetables are also recommended to maximize your experience.  Cooking - Healthy Salads and Dressing Clinical staff conducted group or individual video education with verbal and written material and guidebook.  Patient learns that vegetables, fruits, whole grains, and legumes are the foundations of the [place]. Recommendations include how to incorporate each of these in flavorful and healthy salads, and how to create homemade salad dressings. Proper handling of ingredients is also covered. Cooking - Soups and [employer] - Soups and Desserts Clinical staff conducted group or individual video education with verbal and written material and guidebook.  Patient learns that Pritikin soups and  desserts make for easy, nutritious, and delicious snacks and meal components that are low in sodium, fat, sugar, and calorie density, while high in vitamins, minerals, and filling fiber. Recommendations include simple and healthy ideas for soups and desserts.   Overview     The Pritikin Solution Program  Overview Clinical staff conducted group or individual video education with verbal and written material and guidebook.  Patient learns that the results of the [place] Program have been documented in more than 100 articles published in peer-reviewed journals, and the benefits include reducing risk factors for (and, in some cases, even reversing) high cholesterol, high blood pressure, type 2 diabetes, obesity, and more! An overview of the three key pillars of the Pritikin Program will be covered: eating well, doing regular exercise, and having a healthy mind-set.  WORKSHOPS  Exercise: Exercise Basics: Building Your Action Plan Clinical staff led group instruction and group discussion with PowerPoint presentation and patient guidebook. To enhance the learning environment the use of posters, models and videos may be added. At the conclusion of this workshop, patients will comprehend the difference between physical activity and exercise, as well as the benefits of incorporating both, into their routine. Patients will understand the FITT (Frequency, Intensity, Time, and Type) principle and how to use it to build an exercise action plan. In addition, safety concerns and other considerations for exercise and cardiac rehab will be addressed by the presenter. The purpose of this lesson is to promote a comprehensive and effective weekly exercise routine in order to improve patients' overall level of fitness.   Managing Heart Disease: Your Path to a Healthier Heart Clinical staff led group instruction and group discussion with PowerPoint presentation and patient guidebook. To enhance the learning environment  the use of posters, models and videos may be added.At the conclusion of this workshop, patients will understand the anatomy and physiology of the heart. Additionally, they will understand how Pritikin's three pillars impact the risk factors, the progression, and the management of heart disease.  The purpose of this lesson is to provide a high-level overview of the heart, heart disease, and how the Pritikin lifestyle positively impacts risk factors.  Exercise Biomechanics Clinical staff led group instruction and group discussion with PowerPoint presentation and patient guidebook. To enhance the learning environment the use of posters, models and videos may be added. Patients will learn how the structural parts of their bodies function and how these functions impact their daily activities, movement, and exercise. Patients will learn how to promote a neutral spine, learn how to manage pain, and identify ways to improve their physical movement in order to promote healthy living. The purpose of this lesson is to expose patients to common physical limitations that impact physical activity. Participants will learn practical ways to adapt and manage aches and pains, and to minimize their effect on regular exercise. Patients will learn how to maintain good posture while sitting, walking, and lifting.  Balance Training and Fall Prevention  Clinical staff led group instruction and group discussion with PowerPoint presentation and patient guidebook. To enhance the learning environment the use of posters, models and videos may be added. At the conclusion of this workshop, patients will understand the importance of their sensorimotor skills (vision, proprioception, and the vestibular system) in maintaining their ability to balance as they age. Patients will apply a variety of balancing exercises that are appropriate for their current level of function. Patients will understand the common causes for  poor balance, possible solutions to these problems, and ways to modify their physical environment in order to minimize their fall risk. The purpose of this lesson is to teach patients about the importance of maintaining balance as they age and ways to minimize their risk of falling.  WORKSHOPS   Nutrition:  Fueling a Designer, multimedia Clinical  staff led group instruction and group discussion with PowerPoint presentation and patient guidebook. To enhance the learning environment the use of posters, models and videos may be added. Patients will review the foundational principles of the [place] and understand what constitutes a serving size in each of the food groups. Patients will also learn Pritikin-friendly foods that are better choices when away from home and review make-ahead meal and snack options. Calorie density will be reviewed and applied to three nutrition priorities: weight maintenance, weight loss, and weight gain. The purpose of this lesson is to reinforce (in a group setting) the key concepts around what patients are recommended to eat and how to apply these guidelines when away from home by planning and selecting Pritikin-friendly options. Patients will understand how calorie density may be adjusted for different weight management goals.  Mindful Eating  Clinical staff led group instruction and group discussion with PowerPoint presentation and patient guidebook. To enhance the learning environment the use of posters, models and videos may be added. Patients will briefly review the concepts of the [place] and the importance of low-calorie dense foods. The concept of mindful eating will be introduced as well as the importance of paying attention to internal hunger signals. Triggers for non-hunger eating and techniques for dealing with triggers will be explored. The purpose of this lesson is to provide patients with the opportunity to review the basic principles of the  [place], discuss the value of eating mindfully and how to measure internal cues of hunger and fullness using the Hunger Scale. Patients will also discuss reasons for non-hunger eating and learn strategies to use for controlling emotional eating.  Targeting Your Nutrition Priorities Clinical staff led group instruction and group discussion with PowerPoint presentation and patient guidebook. To enhance the learning environment the use of posters, models and videos may be added. Patients will learn how to determine their genetic susceptibility to disease by reviewing their family history. Patients will gain insight into the importance of diet as part of an overall healthy lifestyle in mitigating the impact of genetics and other environmental insults. The purpose of this lesson is to provide patients with the opportunity to assess their personal nutrition priorities by looking at their family history, their own health history and current risk factors. Patients will also be able to discuss ways of prioritizing and modifying the [place] for their highest risk areas  Menu  Clinical staff led group instruction and group discussion with PowerPoint presentation and patient guidebook. To enhance the learning environment the use of posters, models and videos may be added. Using menus brought in from [employer], or printed from [employer], patients will apply the [place] dining out guidelines that were presented in the [employer] video. Patients will also be able to practice these guidelines in a variety of provided scenarios. The purpose of this lesson is to provide patients with the opportunity to practice hands-on learning of the [place] with actual menus and practice scenarios.  Label Reading Clinical staff led group instruction and group discussion with PowerPoint presentation and patient guidebook. To enhance the learning environment  the use of posters, models and videos may be added. Patients will review and discuss the Pritikin label reading guidelines presented in Pritikin's Label Reading Educational series video. Using fool labels brought in from local grocery stores and markets, patients will apply the label reading guidelines and determine if the packaged food meet the Pritikin guidelines.  The purpose of this lesson is to provide patients with the opportunity to review, discuss, and practice hands-on learning of the Pritikin Label Reading guidelines with actual packaged food labels. [place] Workshops are designed to teach patients ways to prepare quick, simple, and affordable recipes at home. The importance of nutrition's role in chronic disease risk reduction is reflected in its emphasis in the overall Pritikin program. By learning how to prepare essential core Pritikin Eating Plan recipes, patients will increase control over what they eat; be able to customize the flavor of foods without the use of added salt, sugar, or fat; and improve the quality of the food they consume. By learning a set of core recipes which are easily assembled, quickly prepared, and affordable, patients are more likely to prepare more healthy foods at home. These workshops focus on convenient breakfasts, simple entres, side dishes, and desserts which can be prepared with minimal effort and are consistent with nutrition recommendations for cardiovascular risk reduction. Cooking [employer] are taught by a Engineer, materials (RD) who has been trained by the [employer]. The chef or RD has a clear understanding of the importance of minimizing - if not completely eliminating - added fat, sugar, and sodium in recipes. Throughout the series of [place] Workshop sessions, patients will learn about healthy ingredients and efficient methods of cooking to build confidence in their capability to  prepare    Cooking School weekly topics:  Adding Flavor- Sodium-Free  Fast and Healthy Breakfasts  Powerhouse Plant-Based Proteins  Satisfying Salads and Dressings  Simple Sides and Sauces  International Cuisine-Spotlight on the [place] Zones  Delicious Desserts  Savory Soups  Efficiency Cooking - Meals in a Snap  Tasty Appetizers and Snacks  Comforting Weekend Breakfasts  One-Pot Wonders   Fast Evening Meals  Easy [place] (Psychosocial): New Thoughts, New Behaviors Clinical staff led group instruction and group discussion with PowerPoint presentation and patient guidebook. To enhance the learning environment the use of posters, models and videos may be added. Patients will learn and practice techniques for developing effective health and lifestyle goals. Patients will be able to effectively apply the goal setting process learned to develop at least one new personal goal.  The purpose of this lesson is to expose patients to a new skill set of behavior modification techniques such as techniques setting SMART goals, overcoming barriers, and achieving new thoughts and new behaviors.  Managing Moods and Relationships Clinical staff led group instruction and group discussion with PowerPoint presentation and patient guidebook. To enhance the learning environment the use of posters, models and videos may be added. Patients will learn how emotional and chronic stress factors can impact their health and relationships. They will learn healthy ways to manage their moods and utilize positive coping mechanisms. In addition, ICR patients will learn ways to improve communication skills. The purpose of this lesson is to expose patients to ways of understanding how one's mood and health are intimately connected. Developing a healthy outlook can help build positive relationships and connections with others. Patients will understand the  importance of utilizing effective communication skills that include actively listening and being heard. They will learn and understand the importance of the "4 Cs" and especially Connections in fostering of a Healthy Mind-Set.  Healthy Sleep for a Healthy Heart Clinical staff led group instruction and group discussion with PowerPoint presentation and patient guidebook. To enhance  the learning environment the use of posters, models and videos may be added. At the conclusion of this workshop, patients will be able to demonstrate knowledge of the importance of sleep to overall health, well-being, and quality of life. They will understand the symptoms of, and treatments for, common sleep disorders. Patients will also be able to identify daytime and nighttime behaviors which impact sleep, and they will be able to apply these tools to help manage sleep-related challenges. The purpose of this lesson is to provide patients with a general overview of sleep and outline the importance of quality sleep. Patients will learn about a few of the most common sleep disorders. Patients will also be introduced to the concept of "sleep hygiene," and discover ways to self-manage certain sleeping problems through simple daily behavior changes. Finally, the workshop will motivate patients by clarifying the links between quality sleep and their goals of heart-healthy living.   Recognizing and Reducing Stress Clinical staff led group instruction and group discussion with PowerPoint presentation and patient guidebook. To enhance the learning environment the use of posters, models and videos may be added. At the conclusion of this workshop, patients will be able to understand the types of stress reactions, differentiate between acute and chronic stress, and recognize the impact that chronic stress has on their health. They will also be able to apply different coping mechanisms, such as reframing negative self-talk. Patients will have the  opportunity to practice a variety of stress management techniques, such as deep abdominal breathing, progressive muscle relaxation, and/or guided imagery.  The purpose of this lesson is to educate patients on the role of stress in their lives and to provide healthy techniques for coping with it.  Learning Barriers/Preferences:  Learning Barriers/Preferences - 01/07/23 1316       Learning Barriers/Preferences   Learning Barriers None    Learning Preferences Verbal Instruction;Video;Computer/Internet;Written Material;Audio;Group Instruction;Individual Instruction;Pictoral;Skilled Demonstration             Education Topics:  Knowledge Questionnaire Score:  Knowledge Questionnaire Score - 01/07/23 1318       Knowledge Questionnaire Score   Pre Score 18/24             Core Components/Risk Factors/Patient Goals at Admission:  Personal Goals and Risk Factors at Admission - 01/07/23 1316       Core Components/Risk Factors/Patient Goals on Admission    Weight Management Yes;Obesity;Weight Loss    Intervention Weight Management: Develop a combined nutrition and exercise program designed to reach desired caloric intake, while maintaining appropriate intake of nutrient and fiber, sodium and fats, and appropriate energy expenditure required for the weight goal.;Weight Management: Provide education and appropriate resources to help participant work on and attain dietary goals.;Weight Management/Obesity: Establish reasonable short term and long term weight goals.    Admit Weight 222 lb 7.1 oz (100.9 kg)    Goal Weight: Long Term 199 lb (90.3 kg)   Pt goal   Expected Outcomes Short Term: Continue to assess and modify interventions until short term weight is achieved;Long Term: Adherence to nutrition and physical activity/exercise program aimed toward attainment of established weight goal;Weight Maintenance: Understanding of the daily nutrition guidelines, which includes 25-35% calories from  fat, 7% or less cal from saturated fats, less than '200mg'$  cholesterol, less than 1.5gm of sodium, & 5 or more servings of fruits and vegetables daily;Weight Loss: Understanding of general recommendations for a balanced deficit meal plan, which promotes 1-2 lb weight loss per week and includes a negative energy balance  of [phone] kcal/d;Understanding recommendations for meals to include 15-35% energy as protein, 25-35% energy from fat, 35-60% energy from carbohydrates, less than '200mg'$  of dietary cholesterol, 20-35 gm of total fiber daily;Understanding of distribution of calorie intake throughout the day with the consumption of 4-5 meals/snacks    Diabetes Yes    Intervention Provide education about signs/symptoms and action to take for hypo/hyperglycemia.;Provide education about proper nutrition, including hydration, and aerobic/resistive exercise prescription along with prescribed medications to achieve blood glucose in normal ranges: Fasting glucose 65-99 mg/dL    Expected Outcomes Short Term: Participant verbalizes understanding of the signs/symptoms and immediate care of hyper/hypoglycemia, proper foot care and importance of medication, aerobic/resistive exercise and nutrition plan for blood glucose control.;Long Term: Attainment of HbA1C < 7%.    Hypertension Yes    Intervention Provide education on lifestyle modifcations including regular physical activity/exercise, weight management, moderate sodium restriction and increased consumption of fresh fruit, vegetables, and low fat dairy, alcohol moderation, and smoking cessation.;Monitor prescription use compliance.    Expected Outcomes Short Term: Continued assessment and intervention until BP is < 140/4m HG in hypertensive participants. < 130/839mHG in hypertensive participants with diabetes, heart failure or chronic kidney disease.;Long Term: Maintenance of blood pressure at goal levels.    Lipids Yes    Intervention Provide education and support for  participant on nutrition & aerobic/resistive exercise along with prescribed medications to achieve LDL '70mg'$ , HDL >'40mg'$ .    Expected Outcomes Short Term: Participant states understanding of desired cholesterol values and is compliant with medications prescribed. Participant is following exercise prescription and nutrition guidelines.;Long Term: Cholesterol controlled with medications as prescribed, with individualized exercise RX and with personalized nutrition plan. Value goals: LDL < '70mg'$ , HDL > 40 mg.    Stress Yes    Intervention Offer individual and/or small group education and counseling on adjustment to heart disease, stress management and health-related lifestyle change. Teach and support self-help strategies.;Refer participants experiencing significant psychosocial distress to appropriate mental health specialists for further evaluation and treatment. When possible, include family members and significant others in education/counseling sessions.    Expected Outcomes Short Term: Participant demonstrates changes in health-related behavior, relaxation and other stress management skills, ability to obtain effective social support, and compliance with psychotropic medications if prescribed.;Long Term: Emotional wellbeing is indicated by absence of clinically significant psychosocial distress or social isolation.             Core Components/Risk Factors/Patient Goals Review:    Core Components/Risk Factors/Patient Goals at Discharge (Final Review):    ITP Comments:  ITP Comments     Row Name 01/07/23 1308           ITP Comments TrFransico HimMD; Medical Director. Introduction to the [facility]/Intensive Cardiac Rehab. Initial orientation packet reviewed with the patient.                Comments: Participant attended orientation for the cardiac rehabilitation program on  01/07/2023  to perform initial intake and exercise walk test. Patient introduced to the [facility] and orientation packet was reviewed. Completed 6-minute walk test, measurements, initial ITP, and exercise prescription. Vital signs stable. Telemetry-normal sinus rhythm, asymptomatic.MaHarrell GaveN BSN   Service time was from [id number].

## 2023-01-07 NOTE — Progress Notes (Signed)
Cardiac Rehab Medication Review by a Nurse  Does the patient  feel that his/her medications are working for him/her?  yes  Has the patient been experiencing any side effects to the medications prescribed?  no  Does the patient measure his/her own blood pressure or blood glucose at home?  yes   Does the patient have any problems obtaining medications due to transportation or finances?   no  Understanding of regimen: good Understanding of indications: good Potential of compliance: good    Nurse comments: Brendan [name] is taking his medications as prescribed and has a good understanding of what his medications are for. Fred checks his CBG's twice a week. Brendan [name] has a BP cuff he does not check his blood pressures on a regular basis.    Christa See [facility] RN 01/07/2023 2:37 PM

## 2023-01-13 ENCOUNTER — Encounter (HOSPITAL_COMMUNITY)
Admission: RE | Admit: 2023-01-13 | Discharge: 2023-01-13 | Disposition: A | Discharge status: Home / Self Care | Payer: No Typology Code available for payment source | Source: Ambulatory Visit | Attending: Cardiology | Admitting: Cardiology

## 2023-01-13 DIAGNOSIS — I2089 Other forms of angina pectoris: Secondary | ICD-10-CM

## 2023-01-13 DIAGNOSIS — Z48812 Encounter for surgical aftercare following surgery on the circulatory system: Secondary | ICD-10-CM | POA: Diagnosis not present

## 2023-01-13 LAB — GLUCOSE, CAPILLARY
Glucose-Capillary: 287 mg/dL — ABNORMAL HIGH (ref 70–99)
Glucose-Capillary: 132 mg/dL — ABNORMAL HIGH (ref 70–99)

## 2023-01-13 NOTE — Progress Notes (Signed)
Cardiac Individual Treatment Plan  Patient Details  Name: Brendan [name] MRN: [id number] Date of Birth: 1961/06/01 Referring Provider:   Flowsheet Row INTENSIVE CARDIAC REHAB ORIENT from 01/07/2023 in [facility] [place]  Referring Provider Peter Martinique, MD       Initial Encounter Date:  [place] from 01/07/2023 in [facility]  Date 01/07/23       Visit Diagnosis: Chronic stable angina  Patient's Home Medications on Admission:  Current Outpatient Medications:    albuterol (VENTOLIN HFA) 108 (90 Base) MCG/ACT inhaler, Inhale 1-2 puffs into the lungs every 6 (six) hours as needed for shortness of breath or wheezing., Disp: , Rfl:    amoxicillin (AMOXIL) 500 MG capsule, TAKE FOUR CAPSULES BY MOUTH ONCE 1 HOUR PRIOR TO THE DENTAL TREATMENT, Disp: 4 capsule, Rfl: 3   aspirin EC 81 MG tablet, Take 81 mg by mouth at bedtime. Swallow whole., Disp: , Rfl:    atorvastatin (LIPITOR) 40 MG tablet, Take 20 mg by mouth at bedtime., Disp: , Rfl:    benzonatate (TESSALON) 200 MG capsule, Take 1 capsule (200 mg total) by mouth 3 (three) times daily as needed., Disp: 60 capsule, Rfl: 1   clobetasol cream (TEMOVATE) 0.05 %, SMARTSIG:1 Topical Daily, Disp: , Rfl:    diazepam (VALIUM) 5 MG tablet, Take 5 mg by mouth daily as needed for anxiety., Disp: , Rfl:    EPINEPHrine 0.3 mg/0.3 mL IJ SOAJ injection, Inject 0.3 mg into the muscle as needed for anaphylaxis., Disp: 1 each, Rfl: 0   escitalopram (LEXAPRO) 10 MG tablet, Take 10 mg by mouth at bedtime., Disp: , Rfl:    glimepiride (AMARYL) 4 MG tablet, Take 4 mg by mouth every evening., Disp: , Rfl:    mometasone-formoterol (DULERA) 100-5 MCG/ACT AERO, Inhale 2 puffs into the lungs in the morning and at bedtime., Disp: 26.4 g, Rfl: 11   Semaglutide,0.25 or 0.5MG /DOS, 2 MG/3ML SOPN, Inject 0.25 mg into the  skin once a week., Disp: , Rfl:    traZODone (DESYREL) 100 MG tablet, Take 100 mg by mouth at bedtime., Disp: , Rfl:    zolpidem (AMBIEN) 10 MG tablet, Take 10 mg by mouth at bedtime., Disp: , Rfl:   Past Medical History: Past Medical History:  Diagnosis Date   Coronary artery disease    Diabetes mellitus without complication ([place])    Hyperlipidemia    Hypertension    Kidney disease    Sarcoidosis    Stroke ([place])     Tobacco Use: Social History   Tobacco Use  Smoking Status Never  Smokeless Tobacco Never    Labs: Review Flowsheet       Latest Ref Rng & Units 04/03/2008 08/01/2022  Labs for ITP Cardiac and Pulmonary Rehab  PH, Arterial 7.35 - 7.45 - 7.398   PCO2 arterial 32 - 48 mmHg - 37.1   Bicarbonate 20.0 - 28.0 mmol/L - 23.3  24.6  23.1  22.8   TCO2 22 - 32 mmol/L 25  25  26  24  24    Acid-base deficit 0.0 - 2.0 mmol/L - 2.0  1.0  2.0  2.0   O2 Saturation % - 79  75  81  98     Capillary Blood Glucose: Lab Results  Component Value Date   GLUCAP 132 (H) 01/13/2023   GLUCAP 287 (H) 01/13/2023   GLUCAP 109 (  H) 01/07/2023   GLUCAP 68 (L) 08/01/2022   GLUCAP 220 (H) 01/01/2021     Exercise Target Goals: Exercise Program Goal: Individual exercise prescription set using results from initial 6 min walk test and THRR while considering  patient's activity barriers and safety.   Exercise Prescription Goal: Initial exercise prescription builds to 30-45 minutes a day of aerobic activity, 2-3 days per week.  Home exercise guidelines will be given to patient during program as part of exercise prescription that the participant will acknowledge.  Activity Barriers & Risk Stratification:  Activity Barriers & Cardiac Risk Stratification - 01/07/23 1311       Activity Barriers & Cardiac Risk Stratification   Activity Barriers Left Knee Replacement;Right Knee Replacement;Deconditioning;Back Problems;Balance Concerns;Shortness of Breath;Joint Problems    Cardiac Risk  Stratification High             6 Minute Walk:  6 Minute Walk     Row Name 01/07/23 1043         6 Minute Walk   Phase Initial     Distance 1410 feet     Walk Time 6 minutes     # of Rest Breaks 0     MPH 2.7     METS 3.63     RPE 10     Perceived Dyspnea  0     VO2 Peak 12.7     Symptoms No     Resting HR 81 bpm     Resting BP 108/70     Resting Oxygen Saturation  98 %     Exercise Oxygen Saturation  during 6 min walk 98 %     Max Ex. HR 119 bpm     Max Ex. BP 138/60     2 Minute Post BP 112/62       Interval HR   1 Minute HR 92     2 Minute HR 107     3 Minute HR 111     4 Minute HR 113     5 Minute HR 116     6 Minute HR 117     Interval Heart Rate? Yes       Interval Oxygen   Interval Oxygen? Yes     Baseline Oxygen Saturation % 98 %     1 Minute Oxygen Saturation % 94 %     1 Minute Liters of Oxygen 0 L     2 Minute Oxygen Saturation % 94 %     2 Minute Liters of Oxygen 0 L     3 Minute Oxygen Saturation % 94 %     3 Minute Liters of Oxygen 0 L     4 Minute Oxygen Saturation % 93 %     4 Minute Liters of Oxygen 0 L     5 Minute Oxygen Saturation % 96 %     5 Minute Liters of Oxygen 0 L     6 Minute Oxygen Saturation % 98 %     6 Minute Liters of Oxygen 0 L     2 Minute Post Oxygen Saturation % 100 %              Oxygen Initial Assessment:   Oxygen Re-Evaluation:   Oxygen Discharge (Final Oxygen Re-Evaluation):   Initial Exercise Prescription:  Initial Exercise Prescription - 01/07/23 1300       Date of Initial Exercise RX and Referring Provider   Date 01/07/23    Referring Provider Collier Salina  Martinique, MD    Expected Discharge Date 03/21/23      Recumbant Bike   Level 2    RPM 60    Watts 52    Minutes 15    METs 3.6      Arm Ergometer   Level 1.5    Watts 64    RPM 60    Minutes 15    METs 3.6      Prescription Details   Frequency (times per week) 3    Duration Progress to 30 minutes of continuous aerobic without  signs/symptoms of physical distress      Intensity   THRR 40-80% of Max Heartrate 63-126    Ratings of Perceived Exertion 11-13    Perceived Dyspnea 0-4      Progression   Progression Continue progressive overload as per policy without signs/symptoms or physical distress.      Resistance Training   Training Prescription Yes    Weight 4 lbs    Reps 10-15             Perform Capillary Blood Glucose checks as needed.  Exercise Prescription Changes:   Exercise Prescription Changes     Row Name 01/13/23 1600             Response to Exercise   Blood Pressure (Admit) 110/70       Blood Pressure (Exercise) 138/72       Blood Pressure (Exit) 104/70       Heart Rate (Admit) 84 bpm       Heart Rate (Exercise) 126 bpm       Heart Rate (Exit) 93 bpm       Rating of Perceived Exertion (Exercise) 11       Symptoms None       Comments Pt's first day in the [facility] program       Duration Continue with 30 min of aerobic exercise without signs/symptoms of physical distress.       Intensity THRR unchanged         Progression   Progression Continue to progress workloads to maintain intensity without signs/symptoms of physical distress.       Average METs 2.35         Resistance Training   Training Prescription Yes       Weight 4 lbs       Reps 10-15       Time 10 Minutes         Interval Training   Interval Training No         Recumbant Bike   Level 2       Minutes 15       METs 2.3         Arm Ergometer   Level 2.5       Watts 14       RPM 58       Minutes 15       METs 2.4                Exercise Comments:   Exercise Comments     Row Name 01/13/23 1637           Exercise Comments Pt's first day in the [facility] program. Pt had no complaints with todays session. Increased workload on arm ergometer due to low RPE.                Exercise Goals and Review:   Exercise Goals  [place] Name 01/07/23 1314             Exercise Goals   Increase  Physical Activity Yes       Intervention Provide advice, education, support and counseling about physical activity/exercise needs.;Develop an individualized exercise prescription for aerobic and resistive training based on initial evaluation findings, risk stratification, comorbidities and participant's personal goals.       Expected Outcomes Short Term: Attend rehab on a regular basis to increase amount of physical activity.;Long Term: Add in home exercise to make exercise part of routine and to increase amount of physical activity.;Long Term: Exercising regularly at least 3-5 days a week.       Increase Strength and Stamina Yes       Intervention Provide advice, education, support and counseling about physical activity/exercise needs.;Develop an individualized exercise prescription for aerobic and resistive training based on initial evaluation findings, risk stratification, comorbidities and participant's personal goals.       Expected Outcomes Short Term: Increase workloads from initial exercise prescription for resistance, speed, and METs.;Short Term: Perform resistance training exercises routinely during rehab and add in resistance training at home;Long Term: Improve cardiorespiratory fitness, muscular endurance and strength as measured by increased METs and functional capacity (6MWT)       Able to understand and use rate of perceived exertion (RPE) scale Yes       Intervention Provide education and explanation on how to use RPE scale       Expected Outcomes Short Term: Able to use RPE daily in rehab to express subjective intensity level;Long Term:  Able to use RPE to guide intensity level when exercising independently       Knowledge and understanding of Target Heart Rate Range (THRR) Yes       Intervention Provide education and explanation of THRR including how the numbers were predicted and where they are located for reference       Expected Outcomes Short Term: Able to state/look up THRR;Short  Term: Able to use daily as guideline for intensity in rehab;Long Term: Able to use THRR to govern intensity when exercising independently       Understanding of Exercise Prescription Yes       Intervention Provide education, explanation, and written materials on patient's individual exercise prescription       Expected Outcomes Short Term: Able to explain program exercise prescription;Long Term: Able to explain home exercise prescription to exercise independently                Exercise Goals Re-Evaluation :  Exercise Goals Re-Evaluation     Row Name 01/13/23 1636             Exercise Goal Re-Evaluation   Exercise Goals Review Increase Physical Activity;Understanding of Exercise Prescription;Increase Strength and Stamina;Knowledge and understanding of Target Heart Rate Range (THRR);Able to understand and use rate of perceived exertion (RPE) scale       Comments Pt's first day in the [facility] program. Pt understands the exercise Rx, THRR and RPE scale.       Expected Outcomes Will continue to monitor the patient and progress exercise workloads as tolerated.                Discharge Exercise Prescription (Final Exercise Prescription Changes):  Exercise Prescription Changes - 01/13/23 1600       Response to Exercise   Blood Pressure (Admit) 110/70    Blood Pressure (Exercise) 138/72    Blood Pressure (Exit) 104/70  Heart Rate (Admit) 84 bpm    Heart Rate (Exercise) 126 bpm    Heart Rate (Exit) 93 bpm    Rating of Perceived Exertion (Exercise) 11    Symptoms None    Comments Pt's first day in the CRP2 program    Duration Continue with 30 min of aerobic exercise without signs/symptoms of physical distress.    Intensity THRR unchanged      Progression   Progression Continue to progress workloads to maintain intensity without signs/symptoms of physical distress.    Average METs 2.35      Resistance Training   Training Prescription Yes    Weight 4 lbs    Reps 10-15     Time 10 Minutes      Interval Training   Interval Training No      Recumbant Bike   Level 2    Minutes 15    METs 2.3      Arm Ergometer   Level 2.5    Watts 14    RPM 58    Minutes 15    METs 2.4             Nutrition:  Target Goals: Understanding of nutrition guidelines, daily intake of sodium 1500mg , cholesterol 200mg , calories 30% from fat and 7% or less from saturated fats, daily to have 5 or more servings of fruits and vegetables.  Biometrics:  Pre Biometrics - 01/07/23 0945       Pre Biometrics   Waist Circumference 41.75 inches    Hip Circumference 41.5 inches    Waist to Hip Ratio 1.01 %    Triceps Skinfold 18 mm    % Body Fat 29.7 %    Grip Strength 54 kg    Flexibility 11.75 in    Single Leg Stand 13.43 seconds              Nutrition Therapy Plan and Nutrition Goals:  Nutrition Therapy & Goals - 01/13/23 1423       Nutrition Therapy   Diet Heart Healthy/Carbohydrate Consistent    Drug/Food Interactions Statins/Certain Fruits      Personal Nutrition Goals   Nutrition Goal Patient to improve diet quality by using the plate method as a guide for meal planning to include lean protein/plant protein, fruits, vegetables, whole grains, nonfat dairy as part of a well-balanced diet.    Personal Goal #2 Patient to identify strategies for reducing cardiovascular risk by attending the Pritikin education and nutrition series weekly.    Personal Goal #3 Patient to identify strategies for improving blood sugar control with goals <7%    Comments Josph Macho reports motivation to lose weight with goal weight of 205#. He is currently taking semaglutide to aid with blood sugar control and weight loss. He reports eating two meals daily and wife does the majority of cooking. Patient will benefit from participation in intensive cardiac rehab for nutrition, exercise, and lifestyle modification      Intervention Plan   Intervention Prescribe, educate and counsel regarding  individualized specific dietary modifications aiming towards targeted core components such as weight, hypertension, lipid management, diabetes, heart failure and other comorbidities.;Nutrition handout(s) given to patient.    Expected Outcomes Short Term Goal: Understand basic principles of dietary content, such as calories, fat, sodium, cholesterol and nutrients.;Long Term Goal: Adherence to prescribed nutrition plan.             Nutrition Assessments:  MEDIFICTS Score Key: ?70 Need to make dietary changes  40-70 Heart  Healthy Diet ? 40 Therapeutic Level Cholesterol Diet    Picture Your Plate Scores: [id number] Unhealthy dietary pattern with much room for improvement. 41-50 Dietary pattern unlikely to meet recommendations for good health and room for improvement. 51-60 More healthful dietary pattern, with some room for improvement.  >60 Healthy dietary pattern, although there may be some specific behaviors that could be improved.    Nutrition Goals Re-Evaluation:  Nutrition Goals Re-Evaluation     [place] Name 01/13/23 1423             Goals   Current Weight 224 lb 3.3 oz (101.7 kg)       Comment lipids WNL, A1c 8.4, Cr1.6, GFR 47       Expected Outcome Fred reports motivation to lose weight with goal weight of 205#. He is currently taking semaglutide to aid with blood sugar control and weight loss. He reports eating two meals daily and wife does the majority of cooking. Patient will benefit from participation in intensive cardiac rehab for nutrition, exercise, and lifestyle modification                Nutrition Goals Re-Evaluation:  Nutrition Goals Re-Evaluation     [place] Name 01/13/23 1423             Goals   Current Weight 224 lb 3.3 oz (101.7 kg)       Comment lipids WNL, A1c 8.4, Cr1.6, GFR 47       Expected Outcome Fred reports motivation to lose weight with goal weight of 205#. He is currently taking semaglutide to aid with blood sugar control and weight loss. He  reports eating two meals daily and wife does the majority of cooking. Patient will benefit from participation in intensive cardiac rehab for nutrition, exercise, and lifestyle modification                Nutrition Goals Discharge (Final Nutrition Goals Re-Evaluation):  Nutrition Goals Re-Evaluation - 01/13/23 1423       Goals   Current Weight 224 lb 3.3 oz (101.7 kg)    Comment lipids WNL, A1c 8.4, Cr1.6, GFR 47    Expected Outcome Fred reports motivation to lose weight with goal weight of 205#. He is currently taking semaglutide to aid with blood sugar control and weight loss. He reports eating two meals daily and wife does the majority of cooking. Patient will benefit from participation in intensive cardiac rehab for nutrition, exercise, and lifestyle modification             Psychosocial: Target Goals: Acknowledge presence or absence of significant depression and/or stress, maximize coping skills, provide positive support system. Participant is able to verbalize types and ability to use techniques and skills needed for reducing stress and depression.  Initial Review & Psychosocial Screening:  Initial Psych Review & Screening - 01/07/23 1448       Initial Review   Current issues with Current Depression;History of Depression;Current Anxiety/Panic      Family Dynamics   Good Support System? Yes   Josph Macho has his wife and his wifes family for support who live in the area   Comments Josph Macho admits to suffering from severe depression. Josph Macho takes an antidepressant and receives counselling every 6 months. Fred's next counselling session will be on April 20th at the [facility]      Barriers   Psychosocial barriers to participate in program The patient should benefit from training in stress management and relaxation.      Screening  Interventions   Interventions Encouraged to exercise;Provide feedback about the scores to participant    Expected Outcomes Long Term Goal: Stressors or current issues  are controlled or eliminated.;Short Term goal: Identification and review with participant of any Quality of Life or Depression concerns found by scoring the questionnaire.;Long Term goal: The participant improves quality of Life and PHQ9 Scores as seen by post scores and/or verbalization of changes             Quality of Life Scores:  Quality of Life - 01/07/23 1315       Quality of Life   Select Quality of Life      Quality of Life Scores   Health/Function Pre 17.1 %    Socioeconomic Pre 23.06 %    Psych/Spiritual Pre 25.71 %    Family Pre 28.3 %    GLOBAL Pre 21.79 %            Scores of 19 and below usually indicate a poorer quality of life in these areas.  A difference of  2-3 points is a clinically meaningful difference.  A difference of 2-3 points in the total score of the Quality of Life Index has been associated with significant improvement in overall quality of life, self-image, physical symptoms, and general health in studies assessing change in quality of life.  PHQ-9: Review Flowsheet       01/07/2023  Depression screen PHQ 2/9  Decreased Interest 3  Down, Depressed, Hopeless 2  PHQ - 2 Score 5  Altered sleeping 3  Tired, decreased energy 3  Change in appetite 2  Feeling bad or failure about yourself  2  Trouble concentrating 3  Moving slowly or fidgety/restless 1  Suicidal thoughts 0  PHQ-9 Score 19  Difficult doing work/chores Somewhat difficult   Interpretation of Total Score  Total Score Depression Severity:  1-4 = Minimal depression, 5-9 = Mild depression, 10-14 = Moderate depression, 15-19 = Moderately severe depression, 20-27 = Severe depression   Psychosocial Evaluation and Intervention:   Psychosocial Re-Evaluation:  Psychosocial Re-Evaluation     [place] Name 01/13/23 1440             Psychosocial Re-Evaluation   Current issues with Current Depression;Current Anxiety/Panic;History of Depression       Comments Josph Macho did not voice any  increased concerns or stressors on her his first day of exercise at intensive cardiac rehab.       Expected Outcomes Josph Macho will have controlled or decreased depression/ anxiety upon completion of intensive cardiac rehab       Interventions Encouraged to attend Cardiac Rehabilitation for the exercise;Stress management education;Encouraged to attend Pulmonary Rehabilitation for the exercise       Continue Psychosocial Services  Follow up required by staff                Psychosocial Discharge (Final Psychosocial Re-Evaluation):  Psychosocial Re-Evaluation - 01/13/23 1440       Psychosocial Re-Evaluation   Current issues with Current Depression;Current Anxiety/Panic;History of Depression    Comments Josph Macho did not voice any increased concerns or stressors on her his first day of exercise at intensive cardiac rehab.    Expected Outcomes Josph Macho will have controlled or decreased depression/ anxiety upon completion of intensive cardiac rehab    Interventions Encouraged to attend Cardiac Rehabilitation for the exercise;Stress management education;Encouraged to attend Pulmonary Rehabilitation for the exercise    Continue Psychosocial Services  Follow up required by staff  Vocational Rehabilitation: Provide vocational rehab assistance to qualifying candidates.   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 Participant will state understanding/return demonstration of topics presented.     Core Videos: Exercise    Move It!  Clinical staff conducted group or individual video education with verbal and written material and  guidebook.  Patient learns the recommended Pritikin exercise program. Exercise with the goal of living a long, healthy life. Some of the health benefits of exercise include controlled diabetes, healthier blood pressure levels, improved cholesterol levels, improved heart and lung capacity, improved sleep, and better body composition. Everyone should speak with their doctor before starting or changing an exercise routine.  Biomechanical Limitations Clinical staff conducted group or individual video education with verbal and written material and guidebook.  Patient learns how biomechanical limitations can impact exercise and how we can mitigate and possibly overcome limitations to have an impactful and balanced exercise routine.  Body Composition Clinical staff conducted group or individual video education with verbal and written material and guidebook.  Patient learns that body composition (ratio of muscle mass to fat mass) is a key component to assessing overall fitness, rather than body weight alone. Increased fat mass, especially visceral belly fat, can put [place] at increased risk for metabolic syndrome, type 2 diabetes, heart disease, and even death. It is recommended to combine diet and exercise (cardiovascular and resistance training) to improve your body composition. Seek guidance from your physician and exercise physiologist before implementing an exercise routine.  Exercise Action Plan Clinical staff conducted group or individual video education with verbal and written material and guidebook.  Patient learns the recommended strategies to achieve and enjoy long-term exercise adherence, including variety, self-motivation, self-efficacy, and positive decision making. Benefits of exercise include fitness, good health, weight management, more energy, better sleep, less stress, and overall well-being.  Medical   Heart Disease Risk Reduction Clinical staff conducted group or individual video education  with verbal and written material and guidebook.  Patient learns our heart is our most vital organ as it circulates oxygen, nutrients, white blood cells, and hormones throughout the entire body, and carries waste away. Data supports a plant-based eating plan like the Pritikin Program for its effectiveness in slowing progression of and reversing heart disease. The video provides a number of recommendations to address heart disease.   Metabolic Syndrome and Belly Fat  Clinical staff conducted group or individual video education with verbal and written material and guidebook.  Patient learns what metabolic syndrome is, how it leads to heart disease, and how one can reverse it and keep it from coming back. You have metabolic syndrome if you have 3 of the following 5 criteria: abdominal obesity, high blood pressure, high triglycerides, low HDL cholesterol, and high blood sugar.  Hypertension and Heart Disease Clinical staff conducted group or individual video education with verbal and written material and guidebook.  Patient learns that high blood pressure, or hypertension, is very common in the [place]. Hypertension is largely due to excessive salt intake, but other important risk factors include being overweight, physical inactivity, drinking too much alcohol, smoking,  and not eating enough potassium from fruits and vegetables. High blood pressure is a leading risk factor for heart attack, stroke, congestive heart failure, dementia, kidney failure, and premature death. Long-term effects of excessive salt intake include stiffening of the arteries and thickening of heart muscle and organ damage. Recommendations include ways to reduce hypertension and the risk of heart disease.  Diseases of Our Time - Focusing on Diabetes Clinical staff conducted group or individual video education with verbal and written material and guidebook.  Patient learns why the best way to stop diseases of our time is prevention,  through food and other lifestyle changes. Medicine (such as prescription pills and surgeries) is often only a Band-Aid on the problem, not a long-term solution. Most common diseases of our time include obesity, type 2 diabetes, hypertension, heart disease, and cancer. The Pritikin Program is recommended and has been proven to help reduce, reverse, and/or prevent the damaging effects of metabolic syndrome.  Nutrition   Overview of the Pritikin Eating Plan  Clinical staff conducted group or individual video education with verbal and written material and guidebook.  Patient learns about the [place] for disease risk reduction. The [place] emphasizes a wide variety of unrefined, minimally-processed carbohydrates, like fruits, vegetables, whole grains, and legumes. Go, Caution, and Stop food choices are explained. Plant-based and lean animal proteins are emphasized. Rationale provided for low sodium intake for blood pressure control, low added sugars for blood sugar stabilization, and low added fats and oils for coronary artery disease risk reduction and weight management.  Calorie Density  Clinical staff conducted group or individual video education with verbal and written material and guidebook.  Patient learns about calorie density and how it impacts the Pritikin Eating Plan. Knowing the characteristics of the food you choose will help you decide whether those foods will lead to weight gain or weight loss, and whether you want to consume more or less of them. Weight loss is usually a side effect of the Pritikin Eating Plan because of its focus on low calorie-dense foods.  Label Reading  Clinical staff conducted group or individual video education with verbal and written material and guidebook.  Patient learns about the Pritikin recommended label reading guidelines and corresponding recommendations regarding calorie density, added sugars, sodium content, and whole grains.  Dining  Out - Part 1  Clinical staff conducted group or individual video education with verbal and written material and guidebook.  Patient learns that restaurant meals can be sabotaging because they can be so high in calories, fat, sodium, and/or sugar. Patient learns recommended strategies on how to positively address this and avoid unhealthy pitfalls.  Facts on Fats  Clinical staff conducted group or individual video education with verbal and written material and guidebook.  Patient learns that lifestyle modifications can be just as effective, if not more so, as many medications for lowering your risk of heart disease. A Pritikin lifestyle can help to reduce your risk of inflammation and atherosclerosis (cholesterol build-up, or plaque, in the artery walls). Lifestyle interventions such as dietary choices and physical activity address the cause of atherosclerosis. A review of the types of fats and their impact on blood cholesterol levels, along with dietary recommendations to reduce fat intake is also included.  Nutrition Action Plan  Clinical staff conducted group or individual video education with verbal and written material and guidebook.  Patient learns how to incorporate Pritikin recommendations into their lifestyle. Recommendations include planning and keeping personal health goals in mind  as an important part of their success.  Healthy Mind-Set    Healthy Minds, Bodies, Hearts  Clinical staff conducted group or individual video education with verbal and written material and guidebook.  Patient learns how to identify when they are stressed. Video will discuss the impact of that stress, as well as the many benefits of stress management. Patient will also be introduced to stress management techniques. The way we think, act, and feel has an impact on our hearts.  How Our Thoughts Can Heal Our Hearts  Clinical staff conducted group or individual video education with verbal and written material and  guidebook.  Patient learns that negative thoughts can cause depression and anxiety. This can result in negative lifestyle behavior and serious health problems. Cognitive behavioral therapy is an effective method to help control our thoughts in order to change and improve our emotional outlook.  Additional Videos:  Exercise    Improving Performance  Clinical staff conducted group or individual video education with verbal and written material and guidebook.  Patient learns to use a non-linear approach by alternating intensity levels and lengths of time spent exercising to help burn more calories and lose more body fat. Cardiovascular exercise helps improve heart health, metabolism, hormonal balance, blood sugar control, and recovery from fatigue. Resistance training improves strength, endurance, balance, coordination, reaction time, metabolism, and muscle mass. Flexibility exercise improves circulation, posture, and balance. Seek guidance from your physician and exercise physiologist before implementing an exercise routine and learn your capabilities and proper form for all exercise.  Introduction to Yoga  Clinical staff conducted group or individual video education with verbal and written material and guidebook.  Patient learns about yoga, a discipline of the coming together of mind, breath, and body. The benefits of yoga include improved flexibility, improved range of motion, better posture and core strength, increased lung function, weight loss, and positive self-image. Yoga's heart health benefits include lowered blood pressure, healthier heart rate, decreased cholesterol and triglyceride levels, improved immune function, and reduced stress. Seek guidance from your physician and exercise physiologist before implementing an exercise routine and learn your capabilities and proper form for all exercise.  Medical   Aging: Enhancing Your Quality of Life  Clinical staff conducted group or individual  video education with verbal and written material and guidebook.  Patient learns key strategies and recommendations to stay in good physical health and enhance quality of life, such as prevention strategies, having an advocate, securing a [place], and keeping a list of medications and system for tracking them. It also discusses how to avoid risk for bone loss.  Biology of Weight Control  Clinical staff conducted group or individual video education with verbal and written material and guidebook.  Patient learns that weight gain occurs because we consume more calories than we burn (eating more, moving less). Even if your body weight is normal, you may have higher ratios of fat compared to muscle mass. Too much body fat puts you at increased risk for cardiovascular disease, heart attack, stroke, type 2 diabetes, and obesity-related cancers. In addition to exercise, following the [place] can help reduce your risk.  Decoding Lab Results  Clinical staff conducted group or individual video education with verbal and written material and guidebook.  Patient learns that lab test reflects one measurement whose values change over time and are influenced by many factors, including medication, stress, sleep, exercise, food, hydration, pre-existing medical conditions, and more. It is recommended to use  the knowledge from this video to become more involved with your lab results and evaluate your numbers to speak with your doctor.   Diseases of Our Time - Overview  Clinical staff conducted group or individual video education with verbal and written material and guidebook.  Patient learns that according to the CDC, 50% to 70% of chronic diseases (such as obesity, type 2 diabetes, elevated lipids, hypertension, and heart disease) are avoidable through lifestyle improvements including healthier food choices, listening to satiety cues, and increased physical activity.  Sleep  Disorders Clinical staff conducted group or individual video education with verbal and written material and guidebook.  Patient learns how good quality and duration of sleep are important to overall health and well-being. Patient also learns about sleep disorders and how they impact health along with recommendations to address them, including discussing with a physician.  Nutrition  Dining Out - Part 2 Clinical staff conducted group or individual video education with verbal and written material and guidebook.  Patient learns how to plan ahead and communicate in order to maximize their dining experience in a healthy and nutritious manner. Included are recommended food choices based on the type of restaurant the patient is visiting.   Fueling a Best boy conducted group or individual video education with verbal and written material and guidebook.  There is a strong connection between our food choices and our health. Diseases like obesity and type 2 diabetes are very prevalent and are in large-part due to lifestyle choices. The Pritikin Eating Plan provides plenty of food and hunger-curbing satisfaction. It is easy to follow, affordable, and helps reduce health risks.  Menu Workshop  Clinical staff conducted group or individual video education with verbal and written material and guidebook.  Patient learns that restaurant meals can sabotage health goals because they are often packed with calories, fat, sodium, and sugar. Recommendations include strategies to plan ahead and to communicate with the manager, chef, or server to help order a healthier meal.  Planning Your Eating Strategy  Clinical staff conducted group or individual video education with verbal and written material and guidebook.  Patient learns about the [place] and its benefit of reducing the risk of disease. The [place] does not focus on calories. Instead, it emphasizes high-quality,  nutrient-rich foods. By knowing the characteristics of the foods, we choose, we can determine their calorie density and make informed decisions.  Targeting Your Nutrition Priorities  Clinical staff conducted group or individual video education with verbal and written material and guidebook.  Patient learns that lifestyle habits have a tremendous impact on disease risk and progression. This video provides eating and physical activity recommendations based on your personal health goals, such as reducing LDL cholesterol, losing weight, preventing or controlling type 2 diabetes, and reducing high blood pressure.  Vitamins and Minerals  Clinical staff conducted group or individual video education with verbal and written material and guidebook.  Patient learns different ways to obtain key vitamins and minerals, including through a recommended healthy diet. It is important to discuss all supplements you take with your doctor.   Healthy Mind-Set    Smoking Cessation  Clinical staff conducted group or individual video education with verbal and written material and guidebook.  Patient learns that cigarette smoking and tobacco addiction pose a serious health risk which affects millions of people. Stopping smoking will significantly reduce the risk of heart disease, lung disease, and many forms of cancer. Recommended strategies for quitting are covered,  including working with your doctor to develop a successful plan.  Culinary   Becoming a Financial trader conducted group or individual video education with verbal and written material and guidebook.  Patient learns that cooking at home can be healthy, cost-effective, quick, and puts them in control. Keys to cooking healthy recipes will include looking at your recipe, assessing your equipment needs, planning ahead, making it simple, choosing cost-effective seasonal ingredients, and limiting the use of added fats, salts, and sugars.  Cooking -  Breakfast and Snacks  Clinical staff conducted group or individual video education with verbal and written material and guidebook.  Patient learns how important breakfast is to satiety and nutrition through the entire day. Recommendations include key foods to eat during breakfast to help stabilize blood sugar levels and to prevent overeating at meals later in the day. Planning ahead is also a key component.  Cooking - Human resources officer conducted group or individual video education with verbal and written material and guidebook.  Patient learns eating strategies to improve overall health, including an approach to cook more at home. Recommendations include thinking of animal protein as a side on your plate rather than center stage and focusing instead on lower calorie dense options like vegetables, fruits, whole grains, and plant-based proteins, such as beans. Making sauces in large quantities to freeze for later and leaving the skin on your vegetables are also recommended to maximize your experience.  Cooking - Healthy Salads and Dressing Clinical staff conducted group or individual video education with verbal and written material and guidebook.  Patient learns that vegetables, fruits, whole grains, and legumes are the foundations of the [place]. Recommendations include how to incorporate each of these in flavorful and healthy salads, and how to create homemade salad dressings. Proper handling of ingredients is also covered. Cooking - Soups and [employer] - Soups and Desserts Clinical staff conducted group or individual video education with verbal and written material and guidebook.  Patient learns that Pritikin soups and desserts make for easy, nutritious, and delicious snacks and meal components that are low in sodium, fat, sugar, and calorie density, while high in vitamins, minerals, and filling fiber. Recommendations include simple and healthy ideas for soups and  desserts.   Overview     The Pritikin Solution Program Overview Clinical staff conducted group or individual video education with verbal and written material and guidebook.  Patient learns that the results of the [place] Program have been documented in more than 100 articles published in peer-reviewed journals, and the benefits include reducing risk factors for (and, in some cases, even reversing) high cholesterol, high blood pressure, type 2 diabetes, obesity, and more! An overview of the three key pillars of the Pritikin Program will be covered: eating well, doing regular exercise, and having a healthy mind-set.  WORKSHOPS  Exercise: Exercise Basics: Building Your Action Plan Clinical staff led group instruction and group discussion with PowerPoint presentation and patient guidebook. To enhance the learning environment the use of posters, models and videos may be added. At the conclusion of this workshop, patients will comprehend the difference between physical activity and exercise, as well as the benefits of incorporating both, into their routine. Patients will understand the FITT (Frequency, Intensity, Time, and Type) principle and how to use it to build an exercise action plan. In addition, safety concerns and other considerations for exercise and cardiac rehab will be addressed by the presenter. The purpose of this lesson  is to promote a comprehensive and effective weekly exercise routine in order to improve patients' overall level of fitness.   Managing Heart Disease: Your Path to a Healthier Heart Clinical staff led group instruction and group discussion with PowerPoint presentation and patient guidebook. To enhance the learning environment the use of posters, models and videos may be added.At the conclusion of this workshop, patients will understand the anatomy and physiology of the heart. Additionally, they will understand how Pritikin's three pillars impact the risk factors, the  progression, and the management of heart disease.  The purpose of this lesson is to provide a high-level overview of the heart, heart disease, and how the Pritikin lifestyle positively impacts risk factors.  Exercise Biomechanics Clinical staff led group instruction and group discussion with PowerPoint presentation and patient guidebook. To enhance the learning environment the use of posters, models and videos may be added. Patients will learn how the structural parts of their bodies function and how these functions impact their daily activities, movement, and exercise. Patients will learn how to promote a neutral spine, learn how to manage pain, and identify ways to improve their physical movement in order to promote healthy living. The purpose of this lesson is to expose patients to common physical limitations that impact physical activity. Participants will learn practical ways to adapt and manage aches and pains, and to minimize their effect on regular exercise. Patients will learn how to maintain good posture while sitting, walking, and lifting.  Balance Training and Fall Prevention  Clinical staff led group instruction and group discussion with PowerPoint presentation and patient guidebook. To enhance the learning environment the use of posters, models and videos may be added. At the conclusion of this workshop, patients will understand the importance of their sensorimotor skills (vision, proprioception, and the vestibular system) in maintaining their ability to balance as they age. Patients will apply a variety of balancing exercises that are appropriate for their current level of function. Patients will understand the common causes for poor balance, possible solutions to these problems, and ways to modify their physical environment in order to minimize their fall risk. The purpose of this lesson is to teach patients about the importance of maintaining balance as they age and ways to  minimize their risk of falling.  WORKSHOPS   Nutrition:  Fueling a Scientist, research (physical sciences) led group instruction and group discussion with PowerPoint presentation and patient guidebook. To enhance the learning environment the use of posters, models and videos may be added. Patients will review the foundational principles of the [place] and understand what constitutes a serving size in each of the food groups. Patients will also learn Pritikin-friendly foods that are better choices when away from home and review make-ahead meal and snack options. Calorie density will be reviewed and applied to three nutrition priorities: weight maintenance, weight loss, and weight gain. The purpose of this lesson is to reinforce (in a group setting) the key concepts around what patients are recommended to eat and how to apply these guidelines when away from home by planning and selecting Pritikin-friendly options. Patients will understand how calorie density may be adjusted for different weight management goals.  Mindful Eating  Clinical staff led group instruction and group discussion with PowerPoint presentation and patient guidebook. To enhance the learning environment the use of posters, models and videos may be added. Patients will briefly review the concepts of the [place] and the importance of low-calorie dense foods. The concept of mindful  eating will be introduced as well as the importance of paying attention to internal hunger signals. Triggers for non-hunger eating and techniques for dealing with triggers will be explored. The purpose of this lesson is to provide patients with the opportunity to review the basic principles of the [place], discuss the value of eating mindfully and how to measure internal cues of hunger and fullness using the Hunger Scale. Patients will also discuss reasons for non-hunger eating and learn strategies to use for controlling emotional  eating.  Targeting Your Nutrition Priorities Clinical staff led group instruction and group discussion with PowerPoint presentation and patient guidebook. To enhance the learning environment the use of posters, models and videos may be added. Patients will learn how to determine their genetic susceptibility to disease by reviewing their family history. Patients will gain insight into the importance of diet as part of an overall healthy lifestyle in mitigating the impact of genetics and other environmental insults. The purpose of this lesson is to provide patients with the opportunity to assess their personal nutrition priorities by looking at their family history, their own health history and current risk factors. Patients will also be able to discuss ways of prioritizing and modifying the [place] for their highest risk areas  Menu  Clinical staff led group instruction and group discussion with PowerPoint presentation and patient guidebook. To enhance the learning environment the use of posters, models and videos may be added. Using menus brought in from [employer], or printed from [employer], patients will apply the [place] dining out guidelines that were presented in the [employer] video. Patients will also be able to practice these guidelines in a variety of provided scenarios. The purpose of this lesson is to provide patients with the opportunity to practice hands-on learning of the [place] with actual menus and practice scenarios.  Label Reading Clinical staff led group instruction and group discussion with PowerPoint presentation and patient guidebook. To enhance the learning environment the use of posters, models and videos may be added. Patients will review and discuss the Pritikin label reading guidelines presented in Pritikin's Label Reading Educational series video. Using fool labels brought in from local grocery stores and  markets, patients will apply the label reading guidelines and determine if the packaged food meet the Pritikin guidelines. The purpose of this lesson is to provide patients with the opportunity to review, discuss, and practice hands-on learning of the Pritikin Label Reading guidelines with actual packaged food labels. [place] Workshops are designed to teach patients ways to prepare quick, simple, and affordable recipes at home. The importance of nutrition's role in chronic disease risk reduction is reflected in its emphasis in the overall Pritikin program. By learning how to prepare essential core Pritikin Eating Plan recipes, patients will increase control over what they eat; be able to customize the flavor of foods without the use of added salt, sugar, or fat; and improve the quality of the food they consume. By learning a set of core recipes which are easily assembled, quickly prepared, and affordable, patients are more likely to prepare more healthy foods at home. These workshops focus on convenient breakfasts, simple entres, side dishes, and desserts which can be prepared with minimal effort and are consistent with nutrition recommendations for cardiovascular risk reduction. Cooking [employer] are taught by a Engineer, materials (RD) who has been trained by the [employer]. The chef or RD  has a clear understanding of the importance of minimizing - if not completely eliminating - added fat, sugar, and sodium in recipes. Throughout the series of [place] Workshop sessions, patients will learn about healthy ingredients and efficient methods of cooking to build confidence in their capability to prepare    Cooking School weekly topics:  Adding Flavor- Sodium-Free  Fast and Healthy Breakfasts  Powerhouse Plant-Based Proteins  Satisfying Salads and Dressings  Simple Sides and Sauces  International Cuisine-Spotlight on the [place]  Zones  Delicious Desserts  Savory Soups  [employer] - Meals in a Agricultural consultant Appetizers and Snacks  Comforting Weekend Breakfasts  One-Pot Wonders   Fast Evening Meals  Contractor Your Pritikin Plate  WORKSHOPS   Healthy Mindset (Psychosocial):  Focused Goals, Sustainable Changes Clinical staff led group instruction and group discussion with PowerPoint presentation and patient guidebook. To enhance the learning environment the use of posters, models and videos may be added. Patients will be able to apply effective goal setting strategies to establish at least one personal goal, and then take consistent, meaningful action toward that goal. They will learn to identify common barriers to achieving personal goals and develop strategies to overcome them. Patients will also gain an understanding of how our mind-set can impact our ability to achieve goals and the importance of cultivating a positive and growth-oriented mind-set. The purpose of this lesson is to provide patients with a deeper understanding of how to set and achieve personal goals, as well as the tools and strategies needed to overcome common obstacles which may arise along the way.  From Head to Heart: The Power of a Healthy Outlook  Clinical staff led group instruction and group discussion with PowerPoint presentation and patient guidebook. To enhance the learning environment the use of posters, models and videos may be added. Patients will be able to recognize and describe the impact of emotions and mood on physical health. They will discover the importance of self-care and explore self-care practices which may work for them. Patients will also learn how to utilize the 4 C's to cultivate a healthier outlook and better manage stress and challenges. The purpose of this lesson is to demonstrate to patients how a healthy outlook is an essential part of maintaining good health, especially as they continue their  cardiac rehab journey.  Healthy Sleep for a Healthy Heart Clinical staff led group instruction and group discussion with PowerPoint presentation and patient guidebook. To enhance the learning environment the use of posters, models and videos may be added. At the conclusion of this workshop, patients will be able to demonstrate knowledge of the importance of sleep to overall health, well-being, and quality of life. They will understand the symptoms of, and treatments for, common sleep disorders. Patients will also be able to identify daytime and nighttime behaviors which impact sleep, and they will be able to apply these tools to help manage sleep-related challenges. The purpose of this lesson is to provide patients with a general overview of sleep and outline the importance of quality sleep. Patients will learn about a few of the most common sleep disorders. Patients will also be introduced to the concept of "sleep hygiene," and discover ways to self-manage certain sleeping problems through simple daily behavior changes. Finally, the workshop will motivate patients by clarifying the links between quality sleep and their goals of heart-healthy living.   Recognizing and Reducing Stress Clinical staff led group instruction and group discussion with PowerPoint presentation and patient  guidebook. To enhance the learning environment the use of posters, models and videos may be added. At the conclusion of this workshop, patients will be able to understand the types of stress reactions, differentiate between acute and chronic stress, and recognize the impact that chronic stress has on their health. They will also be able to apply different coping mechanisms, such as reframing negative self-talk. Patients will have the opportunity to practice a variety of stress management techniques, such as deep abdominal breathing, progressive muscle relaxation, and/or guided imagery.  The purpose of this lesson is to educate  patients on the role of stress in their lives and to provide healthy techniques for coping with it.  Learning Barriers/Preferences:  Learning Barriers/Preferences - 01/07/23 1316       Learning Barriers/Preferences   Learning Barriers None    Learning Preferences Verbal Instruction;Video;Computer/Internet;Written Material;Audio;Group Instruction;Individual Instruction;Pictoral;Skilled Demonstration             Education Topics:  Knowledge Questionnaire Score:  Knowledge Questionnaire Score - 01/07/23 1318       Knowledge Questionnaire Score   Pre Score 18/24             Core Components/Risk Factors/Patient Goals at Admission:  Personal Goals and Risk Factors at Admission - 01/07/23 1316       Core Components/Risk Factors/Patient Goals on Admission    Weight Management Yes;Obesity;Weight Loss    Intervention Weight Management: Develop a combined nutrition and exercise program designed to reach desired caloric intake, while maintaining appropriate intake of nutrient and fiber, sodium and fats, and appropriate energy expenditure required for the weight goal.;Weight Management: Provide education and appropriate resources to help participant work on and attain dietary goals.;Weight Management/Obesity: Establish reasonable short term and long term weight goals.    Admit Weight 222 lb 7.1 oz (100.9 kg)    Goal Weight: Long Term 199 lb (90.3 kg)   Pt goal   Expected Outcomes Short Term: Continue to assess and modify interventions until short term weight is achieved;Long Term: Adherence to nutrition and physical activity/exercise program aimed toward attainment of established weight goal;Weight Maintenance: Understanding of the daily nutrition guidelines, which includes 25-35% calories from fat, 7% or less cal from saturated fats, less than 200mg  cholesterol, less than 1.5gm of sodium, & 5 or more servings of fruits and vegetables daily;Weight Loss: Understanding of general  recommendations for a balanced deficit meal plan, which promotes 1-2 lb weight loss per week and includes a negative energy balance of [phone] kcal/d;Understanding recommendations for meals to include 15-35% energy as protein, 25-35% energy from fat, 35-60% energy from carbohydrates, less than 200mg  of dietary cholesterol, 20-35 gm of total fiber daily;Understanding of distribution of calorie intake throughout the day with the consumption of 4-5 meals/snacks    Diabetes Yes    Intervention Provide education about signs/symptoms and action to take for hypo/hyperglycemia.;Provide education about proper nutrition, including hydration, and aerobic/resistive exercise prescription along with prescribed medications to achieve blood glucose in normal ranges: Fasting glucose 65-99 mg/dL    Expected Outcomes Short Term: Participant verbalizes understanding of the signs/symptoms and immediate care of hyper/hypoglycemia, proper foot care and importance of medication, aerobic/resistive exercise and nutrition plan for blood glucose control.;Long Term: Attainment of HbA1C < 7%.    Hypertension Yes    Intervention Provide education on lifestyle modifcations including regular physical activity/exercise, weight management, moderate sodium restriction and increased consumption of fresh fruit, vegetables, and low fat dairy, alcohol moderation, and smoking cessation.;Monitor prescription use compliance.  Expected Outcomes Short Term: Continued assessment and intervention until BP is < 140/4mm HG in hypertensive participants. < 130/19mm HG in hypertensive participants with diabetes, heart failure or chronic kidney disease.;Long Term: Maintenance of blood pressure at goal levels.    Lipids Yes    Intervention Provide education and support for participant on nutrition & aerobic/resistive exercise along with prescribed medications to achieve LDL 70mg , HDL >40mg .    Expected Outcomes Short Term: Participant states understanding  of desired cholesterol values and is compliant with medications prescribed. Participant is following exercise prescription and nutrition guidelines.;Long Term: Cholesterol controlled with medications as prescribed, with individualized exercise RX and with personalized nutrition plan. Value goals: LDL < 70mg , HDL > 40 mg.    Stress Yes    Intervention Offer individual and/or small group education and counseling on adjustment to heart disease, stress management and health-related lifestyle change. Teach and support self-help strategies.;Refer participants experiencing significant psychosocial distress to appropriate mental health specialists for further evaluation and treatment. When possible, include family members and significant others in education/counseling sessions.    Expected Outcomes Short Term: Participant demonstrates changes in health-related behavior, relaxation and other stress management skills, ability to obtain effective social support, and compliance with psychotropic medications if prescribed.;Long Term: Emotional wellbeing is indicated by absence of clinically significant psychosocial distress or social isolation.             Core Components/Risk Factors/Patient Goals Review:   Goals and Risk Factor Review     Row Name 01/13/23 1443             Core Components/Risk Factors/Patient Goals Review   Personal Goals Review Weight Management/Obesity;Lipids;Diabetes;Hypertension;Stress       Review Josph Macho started intensive cardiac rehab on 01/13/23. Fred did well with exercise, vital signs and CBG's were stable. Emotional support and encouragment  provided as Josph Macho said he was nervous about exercise.       Expected Outcomes Josph Macho will continue to participate in intensive cardiac rehab for exercise, nutrition and lifestyle modifications                Core Components/Risk Factors/Patient Goals at Discharge (Final Review):   Goals and Risk Factor Review - 01/13/23 1443        Core Components/Risk Factors/Patient Goals Review   Personal Goals Review Weight Management/Obesity;Lipids;Diabetes;Hypertension;Stress    Review Josph Macho started intensive cardiac rehab on 01/13/23. Fred did well with exercise, vital signs and CBG's were stable. Emotional support and encouragment  provided as Josph Macho said he was nervous about exercise.    Expected Outcomes Josph Macho will continue to participate in intensive cardiac rehab for exercise, nutrition and lifestyle modifications             ITP Comments:  ITP Comments     Row Name 01/07/23 1308 01/13/23 1439         ITP Comments Fransico Him, MD; Medical Director. Introduction to the [facility]/Intensive Cardiac Rehab. Initial orientation packet reviewed with the patient. 30 Day ITP Review. Josph Macho started intensive cardiac rehab on 01/13/23 and did well with exercise.               Comments: Pt started cardiac rehab today.  Pt tolerated light exercise without difficulty. VSS, telemetry-Sinus , asymptomatic.  Medication list reconciled. Pt denies barriers to medicaiton compliance.  PSYCHOSOCIAL ASSESSMENT:  PHQ-19. Josph Macho suffers from major depression. Josph Macho is taking an antidepressant , hopeful outlook with supportive family. No psychosocial needs identified at this time, no psychosocial interventions  necessary.    Pt enjoys golf.   Pt oriented to exercise equipment and routine.    Understanding verbalized.Barnet Pall, RN,BSN 01/13/2023 4:57 PM

## 2023-01-15 ENCOUNTER — Encounter (HOSPITAL_COMMUNITY)
Admission: RE | Admit: 2023-01-15 | Discharge: 2023-01-15 | Disposition: A | Discharge status: Home / Self Care | Payer: No Typology Code available for payment source | Source: Ambulatory Visit | Attending: Cardiology | Admitting: Cardiology

## 2023-01-15 DIAGNOSIS — Z48812 Encounter for surgical aftercare following surgery on the circulatory system: Secondary | ICD-10-CM | POA: Diagnosis not present

## 2023-01-15 DIAGNOSIS — I2089 Other forms of angina pectoris: Secondary | ICD-10-CM

## 2023-01-15 DIAGNOSIS — K036 Deposits [accretions] on teeth: Secondary | ICD-10-CM | POA: Diagnosis not present

## 2023-01-15 LAB — GLUCOSE, CAPILLARY
Glucose-Capillary: 248 mg/dL — ABNORMAL HIGH (ref 70–99)
Glucose-Capillary: 173 mg/dL — ABNORMAL HIGH (ref 70–99)

## 2023-01-16 DIAGNOSIS — N5201 Erectile dysfunction due to arterial insufficiency: Secondary | ICD-10-CM | POA: Diagnosis not present

## 2023-01-17 ENCOUNTER — Encounter (HOSPITAL_COMMUNITY)
Admission: RE | Admit: 2023-01-17 | Discharge: 2023-01-17 | Disposition: A | Discharge status: Home / Self Care | Payer: No Typology Code available for payment source | Source: Ambulatory Visit | Attending: Cardiology | Admitting: Cardiology

## 2023-01-17 DIAGNOSIS — I2089 Other forms of angina pectoris: Secondary | ICD-10-CM

## 2023-01-17 DIAGNOSIS — Z48812 Encounter for surgical aftercare following surgery on the circulatory system: Secondary | ICD-10-CM | POA: Diagnosis not present

## 2023-01-17 LAB — GLUCOSE, CAPILLARY: Glucose-Capillary: 222 mg/dL — ABNORMAL HIGH (ref 70–99)

## 2023-01-20 ENCOUNTER — Encounter (HOSPITAL_COMMUNITY)
Admission: RE | Admit: 2023-01-20 | Discharge: 2023-01-20 | Disposition: A | Discharge status: Home / Self Care | Payer: No Typology Code available for payment source | Source: Ambulatory Visit | Attending: Cardiology | Admitting: Cardiology

## 2023-01-20 DIAGNOSIS — I2089 Other forms of angina pectoris: Secondary | ICD-10-CM

## 2023-01-20 DIAGNOSIS — Z48812 Encounter for surgical aftercare following surgery on the circulatory system: Secondary | ICD-10-CM | POA: Diagnosis not present

## 2023-01-20 NOTE — Progress Notes (Signed)
QUALITY OF LIFE SCORE REVIEW  Pt completed Quality of Life survey as a participant in Cardiac Rehab.  Scores 21.0 or below are considered low.  Pt score very low in several areas Overall 21.79, Health and Function 17.10, socioeconomic 23.06, physiological and spiritual 25.71, family 28.30. Patient quality of life slightly altered by physical constraints which limits ability to perform as prior to recent cardiac illness. Brendan [name] reports being dissatisfied with his health due to his recent cardiac event,lung, kidney problems and anxiety and depression. Brendan [name] says the antidepressants he is currently taking is controlling his depression.Brendan [name] says that participating in cardiac rehab has helped to give him a little more confidence as he felt nervous about not know what he can and cannot do  Offered emotional support and reassurance.  Will continue to monitor and intervene as necessary.Harrell Gave RN BSN

## 2023-01-22 ENCOUNTER — Encounter (HOSPITAL_COMMUNITY)
Admission: RE | Admit: 2023-01-22 | Discharge: 2023-01-22 | Disposition: A | Discharge status: Home / Self Care | Payer: No Typology Code available for payment source | Source: Ambulatory Visit | Attending: Cardiology | Admitting: Cardiology

## 2023-01-22 DIAGNOSIS — Z48812 Encounter for surgical aftercare following surgery on the circulatory system: Secondary | ICD-10-CM | POA: Diagnosis not present

## 2023-01-22 DIAGNOSIS — I2089 Other forms of angina pectoris: Secondary | ICD-10-CM

## 2023-01-24 ENCOUNTER — Encounter (HOSPITAL_COMMUNITY)
Admission: RE | Admit: 2023-01-24 | Discharge: 2023-01-24 | Disposition: A | Discharge status: Home / Self Care | Payer: No Typology Code available for payment source | Source: Ambulatory Visit | Attending: Cardiology | Admitting: Cardiology

## 2023-01-24 DIAGNOSIS — I2089 Other forms of angina pectoris: Secondary | ICD-10-CM

## 2023-01-24 DIAGNOSIS — Z48812 Encounter for surgical aftercare following surgery on the circulatory system: Secondary | ICD-10-CM | POA: Diagnosis not present

## 2023-01-27 ENCOUNTER — Encounter (HOSPITAL_COMMUNITY)
Admission: RE | Admit: 2023-01-27 | Discharge: 2023-01-27 | Disposition: A | Discharge status: Home / Self Care | Payer: No Typology Code available for payment source | Source: Ambulatory Visit | Attending: Cardiology | Admitting: Cardiology

## 2023-01-27 DIAGNOSIS — Z4889 Encounter for other specified surgical aftercare: Secondary | ICD-10-CM | POA: Diagnosis not present

## 2023-01-27 DIAGNOSIS — I2089 Other forms of angina pectoris: Secondary | ICD-10-CM | POA: Insufficient documentation

## 2023-01-27 LAB — GLUCOSE, CAPILLARY: Glucose-Capillary: 173 mg/dL — ABNORMAL HIGH (ref 70–99)

## 2023-01-29 ENCOUNTER — Encounter (HOSPITAL_COMMUNITY)
Admission: RE | Admit: 2023-01-29 | Discharge: 2023-01-29 | Disposition: A | Discharge status: Home / Self Care | Payer: No Typology Code available for payment source | Source: Ambulatory Visit | Attending: Cardiology | Admitting: Cardiology

## 2023-01-29 DIAGNOSIS — I2089 Other forms of angina pectoris: Secondary | ICD-10-CM | POA: Diagnosis not present

## 2023-01-29 DIAGNOSIS — Z4889 Encounter for other specified surgical aftercare: Secondary | ICD-10-CM | POA: Diagnosis not present

## 2023-01-31 ENCOUNTER — Encounter (HOSPITAL_COMMUNITY)
Admission: RE | Admit: 2023-01-31 | Discharge: 2023-01-31 | Disposition: A | Discharge status: Home / Self Care | Payer: No Typology Code available for payment source | Source: Ambulatory Visit | Attending: Cardiology | Admitting: Cardiology

## 2023-01-31 DIAGNOSIS — I2089 Other forms of angina pectoris: Secondary | ICD-10-CM

## 2023-01-31 DIAGNOSIS — Z4889 Encounter for other specified surgical aftercare: Secondary | ICD-10-CM | POA: Diagnosis not present

## 2023-02-03 ENCOUNTER — Encounter (HOSPITAL_COMMUNITY)
Admission: RE | Admit: 2023-02-03 | Discharge: 2023-02-03 | Disposition: A | Discharge status: Home / Self Care | Payer: No Typology Code available for payment source | Source: Ambulatory Visit | Attending: Cardiology | Admitting: Cardiology

## 2023-02-03 DIAGNOSIS — I2089 Other forms of angina pectoris: Secondary | ICD-10-CM | POA: Diagnosis not present

## 2023-02-03 DIAGNOSIS — Z4889 Encounter for other specified surgical aftercare: Secondary | ICD-10-CM | POA: Diagnosis not present

## 2023-02-05 ENCOUNTER — Encounter (HOSPITAL_COMMUNITY)
Admission: RE | Admit: 2023-02-05 | Discharge: 2023-02-05 | Disposition: A | Discharge status: Home / Self Care | Payer: No Typology Code available for payment source | Source: Ambulatory Visit | Attending: Cardiology | Admitting: Cardiology

## 2023-02-05 DIAGNOSIS — I2089 Other forms of angina pectoris: Secondary | ICD-10-CM

## 2023-02-05 DIAGNOSIS — Z4889 Encounter for other specified surgical aftercare: Secondary | ICD-10-CM | POA: Diagnosis not present

## 2023-02-06 NOTE — Progress Notes (Signed)
Cardiac Individual Treatment Plan  Patient Details  Name: Brendan [name] MRN: [id number] Date of Birth: 1961-09-01 Referring Provider:   Flowsheet Row INTENSIVE CARDIAC REHAB ORIENT from 01/07/2023 in [facility]  Referring Provider Peter [place], MD       Initial Encounter Date:  Flowsheet Row INTENSIVE CARDIAC REHAB ORIENT from 01/07/2023 in [facility]  Date 01/07/23       Visit Diagnosis: Chronic stable angina  Patient's Home Medications on Admission:  Current Outpatient Medications:    albuterol (VENTOLIN HFA) 108 (90 Base) MCG/ACT inhaler, Inhale 1-2 puffs into the lungs every 6 (six) hours as needed for shortness of breath or wheezing., Disp: , Rfl:    amoxicillin (AMOXIL) 500 MG capsule, TAKE FOUR CAPSULES BY MOUTH ONCE 1 HOUR PRIOR TO THE DENTAL TREATMENT, Disp: 4 capsule, Rfl: 3   aspirin EC 81 MG tablet, Take 81 mg by mouth at bedtime. Swallow whole., Disp: , Rfl:    atorvastatin (LIPITOR) 40 MG tablet, Take 20 mg by mouth at bedtime., Disp: , Rfl:    benzonatate (TESSALON) 200 MG capsule, Take 1 capsule (200 mg total) by mouth 3 (three) times daily as needed., Disp: 60 capsule, Rfl: 1   clobetasol cream (TEMOVATE) 0.05 %, SMARTSIG:1 Topical Daily, Disp: , Rfl:    diazepam (VALIUM) 5 MG tablet, Take 5 mg by mouth daily as needed for anxiety., Disp: , Rfl:    EPINEPHrine 0.3 mg/0.3 mL IJ SOAJ injection, Inject 0.3 mg into the muscle as needed for anaphylaxis., Disp: 1 each, Rfl: 0   escitalopram (LEXAPRO) 10 MG tablet, Take 10 mg by mouth at bedtime., Disp: , Rfl:    glimepiride (AMARYL) 4 MG tablet, Take 4 mg by mouth every evening., Disp: , Rfl:    mometasone-formoterol (DULERA) 100-5 MCG/ACT AERO, Inhale 2 puffs into the lungs in the morning and at bedtime., Disp: 26.4 g, Rfl: 11   Semaglutide,0.25 or 0.5MG /DOS, 2 MG/3ML SOPN, Inject 0.25 mg into the  skin once a week., Disp: , Rfl:    traZODone (DESYREL) 100 MG tablet, Take 100 mg by mouth at bedtime., Disp: , Rfl:    zolpidem (AMBIEN) 10 MG tablet, Take 10 mg by mouth at bedtime., Disp: , Rfl:   Past Medical History: Past Medical History:  Diagnosis Date   Coronary artery disease    Diabetes mellitus without complication (HCC)    Hyperlipidemia    Hypertension    Kidney disease    Sarcoidosis    Stroke (HCC)     Tobacco Use: Social History   Tobacco Use  Smoking Status Never  Smokeless Tobacco Never    Labs: Review Flowsheet       Latest Ref Rng & Units 04/03/2008 08/01/2022  Labs for ITP Cardiac and Pulmonary Rehab  PH, Arterial 7.35 - 7.45 - 7.398   PCO2 arterial 32 - 48 mmHg - 37.1   Bicarbonate 20.0 - 28.0 mmol/L - 23.3  24.6  23.1  22.8   TCO2 22 - 32 mmol/L 25  25  26  24  24    Acid-base deficit 0.0 - 2.0 mmol/L - 2.0  1.0  2.0  2.0   O2 Saturation % - 79  75  81  98     Capillary Blood Glucose: Lab Results  Component Value Date   GLUCAP 173 (H) 01/27/2023   GLUCAP 222 (H) 01/17/2023   GLUCAP 173 (  H) 01/15/2023   GLUCAP 248 (H) 01/15/2023   GLUCAP 132 (H) 01/13/2023     Exercise Target Goals: Exercise Program Goal: Individual exercise prescription set using results from initial 6 min walk test and THRR while considering  patient's activity barriers and safety.   Exercise Prescription Goal: Initial exercise prescription builds to 30-45 minutes a day of aerobic activity, 2-3 days per week.  Home exercise guidelines will be given to patient during program as part of exercise prescription that the participant will acknowledge.  Activity Barriers & Risk Stratification:  Activity Barriers & Cardiac Risk Stratification - 01/07/23 1311       Activity Barriers & Cardiac Risk Stratification   Activity Barriers Left Knee Replacement;Right Knee Replacement;Deconditioning;Back Problems;Balance Concerns;Shortness of Breath;Joint Problems    Cardiac Risk  Stratification High             6 Minute Walk:  6 Minute Walk     Row Name 01/07/23 1043         6 Minute Walk   Phase Initial     Distance 1410 feet     Walk Time 6 minutes     # of Rest Breaks 0     MPH 2.7     METS 3.63     RPE 10     Perceived Dyspnea  0     VO2 Peak 12.7     Symptoms No     Resting HR 81 bpm     Resting BP 108/70     Resting Oxygen Saturation  98 %     Exercise Oxygen Saturation  during 6 min walk 98 %     Max Ex. HR 119 bpm     Max Ex. BP 138/60     2 Minute Post BP 112/62       Interval HR   1 Minute HR 92     2 Minute HR 107     3 Minute HR 111     4 Minute HR 113     5 Minute HR 116     6 Minute HR 117     Interval Heart Rate? Yes       Interval Oxygen   Interval Oxygen? Yes     Baseline Oxygen Saturation % 98 %     1 Minute Oxygen Saturation % 94 %     1 Minute Liters of Oxygen 0 L     2 Minute Oxygen Saturation % 94 %     2 Minute Liters of Oxygen 0 L     3 Minute Oxygen Saturation % 94 %     3 Minute Liters of Oxygen 0 L     4 Minute Oxygen Saturation % 93 %     4 Minute Liters of Oxygen 0 L     5 Minute Oxygen Saturation % 96 %     5 Minute Liters of Oxygen 0 L     6 Minute Oxygen Saturation % 98 %     6 Minute Liters of Oxygen 0 L     2 Minute Post Oxygen Saturation % 100 %              Oxygen Initial Assessment:   Oxygen Re-Evaluation:   Oxygen Discharge (Final Oxygen Re-Evaluation):   Initial Exercise Prescription:  Initial Exercise Prescription - 01/07/23 1300       Date of Initial Exercise RX and Referring Provider   Date 01/07/23    Referring Provider Theron Arista  [place], MD    Expected Discharge Date 03/21/23      Recumbant Bike   Level 2    RPM 60    Watts 52    Minutes 15    METs 3.6      Arm Ergometer   Level 1.5    Watts 64    RPM 60    Minutes 15    METs 3.6      Prescription Details   Frequency (times per week) 3    Duration Progress to 30 minutes of continuous aerobic without  signs/symptoms of physical distress      Intensity   THRR 40-80% of Max Heartrate 63-126    Ratings of Perceived Exertion 11-13    Perceived Dyspnea 0-4      Progression   Progression Continue progressive overload as per policy without signs/symptoms or physical distress.      Resistance Training   Training Prescription Yes    Weight 4 lbs    Reps 10-15             Perform Capillary Blood Glucose checks as needed.  Exercise Prescription Changes:   Exercise Prescription Changes     Row Name 01/13/23 1600 01/31/23 1600           Response to Exercise   Blood Pressure (Admit) 110/70 108/58      Blood Pressure (Exercise) 138/72 164/64      Blood Pressure (Exit) 104/70 102/58      Heart Rate (Admit) 84 bpm 83 bpm      Heart Rate (Exercise) 126 bpm 136 bpm      Heart Rate (Exit) 93 bpm 92 bpm      Rating of Perceived Exertion (Exercise) 11 11      Symptoms None None      Comments Pt's first day in the CRP2 program Reviewed METs      Duration Continue with 30 min of aerobic exercise without signs/symptoms of physical distress. Continue with 30 min of aerobic exercise without signs/symptoms of physical distress.      Intensity THRR unchanged THRR unchanged        Progression   Progression Continue to progress workloads to maintain intensity without signs/symptoms of physical distress. Continue to progress workloads to maintain intensity without signs/symptoms of physical distress.      Average METs 2.35 2.4        Resistance Training   Training Prescription Yes Yes      Weight 4 lbs 4 lbs      Reps 10-15 10-15      Time 10 Minutes 10 Minutes        Interval Training   Interval Training No No        Recumbant Bike   Level 2 2.5      RPM -- 61      Watts -- 16      Minutes 15 15      METs 2.3 2.3        Arm Ergometer   Level 2.5 2.5      Watts 14 22      RPM 58 63      Minutes 15 15      METs 2.4 2.5               Exercise Comments:   Exercise  Comments     Row Name 01/13/23 1637 01/31/23 1406         Exercise Comments Pt's first day  in the CRP2 program. Pt had no complaints with todays session. Increased workload on arm ergometer due to low RPE. Reviewed METs. Pt is progressing in the CRP2 program.               Exercise Goals and Review:   Exercise Goals     Row Name 01/07/23 1314             Exercise Goals   Increase Physical Activity Yes       Intervention Provide advice, education, support and counseling about physical activity/exercise needs.;Develop an individualized exercise prescription for aerobic and resistive training based on initial evaluation findings, risk stratification, comorbidities and participant's personal goals.       Expected Outcomes Short Term: Attend rehab on a regular basis to increase amount of physical activity.;Long Term: Add in home exercise to make exercise part of routine and to increase amount of physical activity.;Long Term: Exercising regularly at least 3-5 days a week.       Increase Strength and Stamina Yes       Intervention Provide advice, education, support and counseling about physical activity/exercise needs.;Develop an individualized exercise prescription for aerobic and resistive training based on initial evaluation findings, risk stratification, comorbidities and participant's personal goals.       Expected Outcomes Short Term: Increase workloads from initial exercise prescription for resistance, speed, and METs.;Short Term: Perform resistance training exercises routinely during rehab and add in resistance training at home;Long Term: Improve cardiorespiratory fitness, muscular endurance and strength as measured by increased METs and functional capacity ( )       Able to understand and use rate of perceived exertion (RPE) scale Yes       Intervention Provide education and explanation on how to use RPE scale       Expected Outcomes Short Term: Able to use RPE daily in rehab to  express subjective intensity level;Long Term:  Able to use RPE to guide intensity level when exercising independently       Knowledge and understanding of Target Heart Rate Range (THRR) Yes       Intervention Provide education and explanation of THRR including how the numbers were predicted and where they are located for reference       Expected Outcomes Short Term: Able to state/look up THRR;Short Term: Able to use daily as guideline for intensity in rehab;Long Term: Able to use THRR to govern intensity when exercising independently       Understanding of Exercise Prescription Yes       Intervention Provide education, explanation, and written materials on patient's individual exercise prescription       Expected Outcomes Short Term: Able to explain program exercise prescription;Long Term: Able to explain home exercise prescription to exercise independently                Exercise Goals Re-Evaluation :  Exercise Goals Re-Evaluation     Row Name 01/13/23 1636             Exercise Goal Re-Evaluation   Exercise Goals Review Increase Physical Activity;Understanding of Exercise Prescription;Increase Strength and Stamina;Knowledge and understanding of Target Heart Rate Range (THRR);Able to understand and use rate of perceived exertion (RPE) scale       Comments Pt's first day in the cRP2 program. Pt understands the exercise Rx, THRR and RPE scale.       Expected Outcomes Will continue to monitor the patient and progress exercise workloads as tolerated.  Discharge Exercise Prescription (Final Exercise Prescription Changes):  Exercise Prescription Changes - 01/31/23 1600       Response to Exercise   Blood Pressure (Admit) 108/58    Blood Pressure (Exercise) 164/64    Blood Pressure (Exit) 102/58    Heart Rate (Admit) 83 bpm    Heart Rate (Exercise) 136 bpm    Heart Rate (Exit) 92 bpm    Rating of Perceived Exertion (Exercise) 11    Symptoms None    Comments  Reviewed METs    Duration Continue with 30 min of aerobic exercise without signs/symptoms of physical distress.    Intensity THRR unchanged      Progression   Progression Continue to progress workloads to maintain intensity without signs/symptoms of physical distress.    Average METs 2.4      Resistance Training   Training Prescription Yes    Weight 4 lbs    Reps 10-15    Time 10 Minutes      Interval Training   Interval Training No      Recumbant Bike   Level 2.5    RPM 61    Watts 16    Minutes 15    METs 2.3      Arm Ergometer   Level 2.5    Watts 22    RPM 63    Minutes 15    METs 2.5             Nutrition:  Target Goals: Understanding of nutrition guidelines, daily intake of sodium 1500mg , cholesterol 200mg , calories 30% from fat and 7% or less from saturated fats, daily to have 5 or more servings of fruits and vegetables.  Biometrics:  Pre Biometrics - 01/07/23 0945       Pre Biometrics   Waist Circumference 41.75 inches    Hip Circumference 41.5 inches    Waist to Hip Ratio 1.01 %    Triceps Skinfold 18 mm    % Body Fat 29.7 %    Grip Strength 54 kg    Flexibility 11.75 in    Single Leg Stand 13.43 seconds              Nutrition Therapy Plan and Nutrition Goals:  Nutrition Therapy & Goals - 01/13/23 1423       Nutrition Therapy   Diet Heart Healthy/Carbohydrate Consistent    Drug/Food Interactions Statins/Certain Fruits      Personal Nutrition Goals   Nutrition Goal Patient to improve diet quality by using the plate method as a guide for meal planning to include lean protein/plant protein, fruits, vegetables, whole grains, nonfat dairy as part of a well-balanced diet.    Personal Goal #2 Patient to identify strategies for reducing cardiovascular risk by attending the [employer] education and nutrition series weekly.    Personal Goal #3 Patient to identify strategies for improving blood sugar control with goals <7%    Comments Brendan [name]  reports motivation to lose weight with goal weight of 205#. He is currently taking semaglutide to aid with blood sugar control and weight loss. He reports eating two meals daily and wife does the majority of cooking. Patient will benefit from participation in intensive cardiac rehab for nutrition, exercise, and lifestyle modification      Intervention Plan   Intervention Prescribe, educate and counsel regarding individualized specific dietary modifications aiming towards targeted core components such as weight, hypertension, lipid management, diabetes, heart failure and other comorbidities.;Nutrition handout(s) given to patient.    Expected Outcomes  Short Term Goal: Understand basic principles of dietary content, such as calories, fat, sodium, cholesterol and nutrients.;Long Term Goal: Adherence to prescribed nutrition plan.             Nutrition Assessments:  Nutrition Assessments - 01/17/23 1044       Rate Your Plate Scores   Pre Score 50            MEDIFICTS Score Key: ?70 Need to make dietary changes  40-70 Heart Healthy Diet ? 40 Therapeutic Level Cholesterol Diet   Flowsheet Row INTENSIVE CARDIAC REHAB from 01/15/2023 in [facility]  Picture Your Plate Total Score on Admission 50      Picture Your Plate Scores: <40 Unhealthy dietary pattern with much room for improvement. 41-50 Dietary pattern unlikely to meet recommendations for good health and room for improvement. 51-60 More healthful dietary pattern, with some room for improvement.  >60 Healthy dietary pattern, although there may be some specific behaviors that could be improved.    Nutrition Goals Re-Evaluation:  Nutrition Goals Re-Evaluation     Row Name 01/13/23 1423             Goals   Current Weight 224 lb 3.3 oz (101.7 kg)       Comment lipids WNL, A1c 8.4, Cr1.6, GFR 47       Expected Outcome Brendan [name] reports motivation to lose weight with goal  weight of 205#. He is currently taking semaglutide to aid with blood sugar control and weight loss. He reports eating two meals daily and wife does the majority of cooking. Patient will benefit from participation in intensive cardiac rehab for nutrition, exercise, and lifestyle modification                Nutrition Goals Re-Evaluation:  Nutrition Goals Re-Evaluation     Row Name 01/13/23 1423             Goals   Current Weight 224 lb 3.3 oz (101.7 kg)       Comment lipids WNL, A1c 8.4, Cr1.6, GFR 47       Expected Outcome Brendan [name] reports motivation to lose weight with goal weight of 205#. He is currently taking semaglutide to aid with blood sugar control and weight loss. He reports eating two meals daily and wife does the majority of cooking. Patient will benefit from participation in intensive cardiac rehab for nutrition, exercise, and lifestyle modification                Nutrition Goals Discharge (Final Nutrition Goals Re-Evaluation):  Nutrition Goals Re-Evaluation - 01/13/23 1423       Goals   Current Weight 224 lb 3.3 oz (101.7 kg)    Comment lipids WNL, A1c 8.4, Cr1.6, GFR 47    Expected Outcome Brendan [name] reports motivation to lose weight with goal weight of 205#. He is currently taking semaglutide to aid with blood sugar control and weight loss. He reports eating two meals daily and wife does the majority of cooking. Patient will benefit from participation in intensive cardiac rehab for nutrition, exercise, and lifestyle modification             Psychosocial: Target Goals: Acknowledge presence or absence of significant depression and/or stress, maximize coping skills, provide positive support system. Participant is able to verbalize types and ability to use techniques and skills needed for reducing stress and depression.  Initial Review & Psychosocial Screening:  Initial Psych Review & Screening -  01/07/23 1448       Initial Review   Current issues with Current  Depression;History of Depression;Current Anxiety/Panic      Family Dynamics   Good Support System? Yes   Brendan [name] has his wife and his wifes family for support who live in the area   Comments Brendan [name] admits to suffering from severe depression. Brendan [name] takes an antidepressant and receives counselling every 6 months. Brendan [name]'s next counselling session will be on April 20th at the [facility]      Barriers   Psychosocial barriers to participate in program The patient should benefit from training in stress management and relaxation.      Screening Interventions   Interventions Encouraged to exercise;Provide feedback about the scores to participant    Expected Outcomes Long Term Goal: Stressors or current issues are controlled or eliminated.;Short Term goal: Identification and review with participant of any Quality of Life or Depression concerns found by scoring the questionnaire.;Long Term goal: The participant improves quality of Life and PHQ9 Scores as seen by post scores and/or verbalization of changes             Quality of Life Scores:  Quality of Life - 01/07/23 1315       Quality of Life   Select Quality of Life      Quality of Life Scores   Health/Function Pre 17.1 %    Socioeconomic Pre 23.06 %    Psych/Spiritual Pre 25.71 %    Family Pre 28.3 %    GLOBAL Pre 21.79 %            Scores of 19 and below usually indicate a poorer quality of life in these areas.  A difference of  2-3 points is a clinically meaningful difference.  A difference of 2-3 points in the total score of the Quality of Life Index has been associated with significant improvement in overall quality of life, self-image, physical symptoms, and general health in studies assessing change in quality of life.  PHQ-9: Review Flowsheet       01/07/2023  Depression screen PHQ 2/9  Decreased Interest 3  Down, Depressed, Hopeless 2  PHQ - 2 Score 5  Altered sleeping 3  Tired, decreased energy 3  Change in appetite 2  Feeling  bad or failure about yourself  2  Trouble concentrating 3  Moving slowly or fidgety/restless 1  Suicidal thoughts 0  PHQ-9 Score 19  Difficult doing work/chores Somewhat difficult   Interpretation of Total Score  Total Score Depression Severity:  1-4 = Minimal depression, 5-9 = Mild depression, 10-14 = Moderate depression, 15-19 = Moderately severe depression, 20-27 = Severe depression   Psychosocial Evaluation and Intervention:   Psychosocial Re-Evaluation:  Psychosocial Re-Evaluation     Row Name 01/13/23 1440             Psychosocial Re-Evaluation   Current issues with Current Depression;Current Anxiety/Panic;History of Depression       Comments Brendan [name] did not voice any increased concerns or stressors on her his first day of exercise at intensive cardiac rehab.       Expected Outcomes Brendan [name] will have controlled or decreased depression/ anxiety upon completion of intensive cardiac rehab       Interventions Encouraged to attend Cardiac Rehabilitation for the exercise;Stress management education;Encouraged to attend Pulmonary Rehabilitation for the exercise       Continue Psychosocial Services  Follow up required by staff  Psychosocial Discharge (Final Psychosocial Re-Evaluation):  Psychosocial Re-Evaluation - 01/13/23 1440       Psychosocial Re-Evaluation   Current issues with Current Depression;Current Anxiety/Panic;History of Depression    Comments Brendan AlbertFred did not voice any increased concerns or stressors on her his first day of exercise at intensive cardiac rehab.    Expected Outcomes Brendan AlbertFred will have controlled or decreased depression/ anxiety upon completion of intensive cardiac rehab    Interventions Encouraged to attend Cardiac Rehabilitation for the exercise;Stress management education;Encouraged to attend Pulmonary Rehabilitation for the exercise    Continue Psychosocial Services  Follow up required by staff             Vocational  Rehabilitation: Provide vocational rehab assistance to qualifying candidates.   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 Participant will state understanding/return demonstration of topics presented.    Education     Row Name 01/13/23 1700     Education   Cardiac Education Topics [employer]   Select Workshops     Workshops   Educator Exercise Physiologist   Select Psychosocial   Psychosocial Workshop Recognizing and Reducing Stress   Instruction Review Code 1- Verbalizes Understanding   Class Start Time 1148   Class Stop Time 1233   Class Time Calculation (min) 45 min    Row Name 01/15/23 1200     Education   Cardiac Education Topics [employer]   Customer service managerelect Cooking School     Cooking School   Educator Dietitian   Weekly Topic Personalizing Your Pritikin Plate   Instruction Review Code 1- Verbalizes Understanding   Class Start Time 1140   Class Stop Time 1220   Class Time Calculation (min) 40 min    Row Name 01/17/23 1300     Education   Cardiac Education Topics [employer]   Nurse, children'select Core Videos     Core Videos   Educator Exercise Physiologist   Select Psychosocial   Psychosocial Healthy Minds, Bodies, Hearts   Instruction Review Code 1- Verbalizes Understanding   Class Start Time 1151   Class Stop Time 1230   Class Time Calculation (min) 39 min    Row Name 01/20/23 1300     Education   Cardiac Education Topics [employer]   Glass blower/designerelect Workshops     Workshops   Educator Dietitian   Select Nutrition   Nutrition Workshop Label Reading   Instruction Review Code 1- Tax inspectorVerbalizes Understanding   Class Start Time 1150    Class Stop Time 1233   Class Time Calculation (min) 43 min    Row Name 01/22/23 1300     Education   Cardiac Education Topics [employer]   Customer service managerelect Cooking School     Cooking School   Educator Dietitian   Weekly Topic Tasty Appetizers and Snacks   Instruction Review Code 1- Verbalizes Understanding   Class Start Time 1150   Class Stop Time 1226   Class Time Calculation (min) 36 min    Row Name 01/24/23 1200     Education   Cardiac Education Topics [employer]in   Psychologist, sport and exerciseelect Core Videos     Core Videos  Educator Dietitian   Select Nutrition   Instruction Review Code 1- Verbalizes Understanding   Class Start Time 1150   Class Stop Time 1226   Class Time Calculation (min) 36 min    Row Name 01/27/23 1300     Education   Cardiac Education Topics [employer]   Geographical information systems officer Exercise   Exercise Workshop Exercise Basics: Building Your Action Plan   Instruction Review Code 1- Verbalizes Understanding   Class Start Time 1150   Class Stop Time 1235   Class Time Calculation (min) 45 min    Row Name 01/29/23 1500     Education   Cardiac Education Topics Pritikin   Orthoptist   Educator Dietitian   Weekly Topic Tasty Appetizers and Snacks   Instruction Review Code 1- Verbalizes Understanding   Class Start Time 1145   Class Stop Time 1223   Class Time Calculation (min) 38 min    Row Name 01/31/23 1300     Education   Cardiac Education Topics Pritikin   Licensed conveyancer Nutrition   Nutrition Calorie Density   Instruction Review Code 1- Verbalizes Understanding   Class Start Time 1145   Class Stop Time 1225   Class Time Calculation (min) 40 min    Row Name 02/03/23 1400     Education   Cardiac Education Topics Pritikin   Nurse, children's   Educator Dietitian   Select Nutrition   Nutrition Nutrition Action  Plan   Instruction Review Code 1- Verbalizes Understanding   Class Start Time 1145   Class Stop Time 1219   Class Time Calculation (min) 34 min    Row Name 02/05/23 1300     Education   Cardiac Education Topics Pritikin   Customer service manager   Weekly Topic Efficiency Cooking - Meals in a Snap   Instruction Review Code 1- Verbalizes Understanding   Class Start Time 1140   Class Stop Time 1220   Class Time Calculation (min) 40 min            Core Videos: Exercise    Move It!  Clinical staff conducted group or individual video education with verbal and written material and guidebook.  Patient learns the recommended Pritikin exercise program. Exercise with the goal of living a long, healthy life. Some of the health benefits of exercise include controlled diabetes, healthier blood pressure levels, improved cholesterol levels, improved heart and lung capacity, improved sleep, and better body composition. Everyone should speak with their doctor before starting or changing an exercise routine.  Biomechanical Limitations Clinical staff conducted group or individual video education with verbal and written material and guidebook.  Patient learns how biomechanical limitations can impact exercise and how we can mitigate and possibly overcome limitations to have an impactful and balanced exercise routine.  Body Composition Clinical staff conducted group or individual video education with verbal and written material and guidebook.  Patient learns that body composition (ratio of muscle mass to fat mass) is a key component to assessing overall fitness, rather than body weight alone. Increased fat mass, especially visceral belly fat, can put [place] at increased risk for metabolic syndrome, type 2 diabetes, heart disease, and even death. It is recommended  to combine diet and exercise (cardiovascular and resistance training) to improve your body composition.  Seek guidance from your physician and exercise physiologist before implementing an exercise routine.  Exercise Action Plan Clinical staff conducted group or individual video education with verbal and written material and guidebook.  Patient learns the recommended strategies to achieve and enjoy long-term exercise adherence, including variety, self-motivation, self-efficacy, and positive decision making. Benefits of exercise include fitness, good health, weight management, more energy, better sleep, less stress, and overall well-being.  Medical   Heart Disease Risk Reduction Clinical staff conducted group or individual video education with verbal and written material and guidebook.  Patient learns our heart is our most vital organ as it circulates oxygen, nutrients, white blood cells, and hormones throughout the entire body, and carries waste away. Data supports a plant-based eating plan like the Pritikin Program for its effectiveness in slowing progression of and reversing heart disease. The video provides a number of recommendations to address heart disease.   Metabolic Syndrome and Belly Fat  Clinical staff conducted group or individual video education with verbal and written material and guidebook.  Patient learns what metabolic syndrome is, how it leads to heart disease, and how one can reverse it and keep it from coming back. You have metabolic syndrome if you have 3 of the following 5 criteria: abdominal obesity, high blood pressure, high triglycerides, low HDL cholesterol, and high blood sugar.  Hypertension and Heart Disease Clinical staff conducted group or individual video education with verbal and written material and guidebook.  Patient learns that high blood pressure, or hypertension, is very common in the [place]. Hypertension is largely due to excessive salt intake, but other important risk factors include being overweight, physical inactivity, drinking too much alcohol,  smoking, and not eating enough potassium from fruits and vegetables. High blood pressure is a leading risk factor for heart attack, stroke, congestive heart failure, dementia, kidney failure, and premature death. Long-term effects of excessive salt intake include stiffening of the arteries and thickening of heart muscle and organ damage. Recommendations include ways to reduce hypertension and the risk of heart disease.  Diseases of Our Time - Focusing on Diabetes Clinical staff conducted group or individual video education with verbal and written material and guidebook.  Patient learns why the best way to stop diseases of our time is prevention, through food and other lifestyle changes. Medicine (such as prescription pills and surgeries) is often only a Band-Aid on the problem, not a long-term solution. Most common diseases of our time include obesity, type 2 diabetes, hypertension, heart disease, and cancer. The Pritikin Program is recommended and has been proven to help reduce, reverse, and/or prevent the damaging effects of metabolic syndrome.  Nutrition   Overview of the Pritikin Eating Plan  Clinical staff conducted group or individual video education with verbal and written material and guidebook.  Patient learns about the Pritikin Eating Plan for disease risk reduction. The Pritikin Eating Plan emphasizes a wide variety of unrefined, minimally-processed carbohydrates, like fruits, vegetables, whole grains, and legumes. Go, Caution, and Stop food choices are explained. Plant-based and lean animal proteins are emphasized. Rationale provided for low sodium intake for blood pressure control, low added sugars for blood sugar stabilization, and low added fats and oils for coronary artery disease risk reduction and weight management.  Calorie Density  Clinical staff conducted group or individual video education with verbal and written material and guidebook.  Patient learns about calorie density and how  it impacts  the Pritikin Eating Plan. Knowing the characteristics of the food you choose will help you decide whether those foods will lead to weight gain or weight loss, and whether you want to consume more or less of them. Weight loss is usually a side effect of the Pritikin Eating Plan because of its focus on low calorie-dense foods.  Label Reading  Clinical staff conducted group or individual video education with verbal and written material and guidebook.  Patient learns about the Pritikin recommended label reading guidelines and corresponding recommendations regarding calorie density, added sugars, sodium content, and whole grains.  Dining Out - Part 1  Clinical staff conducted group or individual video education with verbal and written material and guidebook.  Patient learns that restaurant meals can be sabotaging because they can be so high in calories, fat, sodium, and/or sugar. Patient learns recommended strategies on how to positively address this and avoid unhealthy pitfalls.  Facts on Fats  Clinical staff conducted group or individual video education with verbal and written material and guidebook.  Patient learns that lifestyle modifications can be just as effective, if not more so, as many medications for lowering your risk of heart disease. A Pritikin lifestyle can help to reduce your risk of inflammation and atherosclerosis (cholesterol build-up, or plaque, in the artery walls). Lifestyle interventions such as dietary choices and physical activity address the cause of atherosclerosis. A review of the types of fats and their impact on blood cholesterol levels, along with dietary recommendations to reduce fat intake is also included.  Nutrition Action Plan  Clinical staff conducted group or individual video education with verbal and written material and guidebook.  Patient learns how to incorporate Pritikin recommendations into their lifestyle. Recommendations include planning and keeping  personal health goals in mind as an important part of their success.  Healthy Mind-Set    Healthy Minds, Bodies, Hearts  Clinical staff conducted group or individual video education with verbal and written material and guidebook.  Patient learns how to identify when they are stressed. Video will discuss the impact of that stress, as well as the many benefits of stress management. Patient will also be introduced to stress management techniques. The way we think, act, and feel has an impact on our hearts.  How Our Thoughts Can Heal Our Hearts  Clinical staff conducted group or individual video education with verbal and written material and guidebook.  Patient learns that negative thoughts can cause depression and anxiety. This can result in negative lifestyle behavior and serious health problems. Cognitive behavioral therapy is an effective method to help control our thoughts in order to change and improve our emotional outlook.  Additional Videos:  Exercise    Improving Performance  Clinical staff conducted group or individual video education with verbal and written material and guidebook.  Patient learns to use a non-linear approach by alternating intensity levels and lengths of time spent exercising to help burn more calories and lose more body fat. Cardiovascular exercise helps improve heart health, metabolism, hormonal balance, blood sugar control, and recovery from fatigue. Resistance training improves strength, endurance, balance, coordination, reaction time, metabolism, and muscle mass. Flexibility exercise improves circulation, posture, and balance. Seek guidance from your physician and exercise physiologist before implementing an exercise routine and learn your capabilities and proper form for all exercise.  Introduction to Yoga  Clinical staff conducted group or individual video education with verbal and written material and guidebook.  Patient learns about yoga, a discipline of the  coming together of mind,  breath, and body. The benefits of yoga include improved flexibility, improved range of motion, better posture and core strength, increased lung function, weight loss, and positive self-image. Yoga's heart health benefits include lowered blood pressure, healthier heart rate, decreased cholesterol and triglyceride levels, improved immune function, and reduced stress. Seek guidance from your physician and exercise physiologist before implementing an exercise routine and learn your capabilities and proper form for all exercise.  Medical   Aging: Enhancing Your Quality of Life  Clinical staff conducted group or individual video education with verbal and written material and guidebook.  Patient learns key strategies and recommendations to stay in good physical health and enhance quality of life, such as prevention strategies, having an advocate, securing a Health Care Proxy and Power of Attorney, and keeping a list of medications and system for tracking them. It also discusses how to avoid risk for bone loss.  Biology of Weight Control  Clinical staff conducted group or individual video education with verbal and written material and guidebook.  Patient learns that weight gain occurs because we consume more calories than we burn (eating more, moving less). Even if your body weight is normal, you may have higher ratios of fat compared to muscle mass. Too much body fat puts you at increased risk for cardiovascular disease, heart attack, stroke, type 2 diabetes, and obesity-related cancers. In addition to exercise, following the Pritikin Eating Plan can help reduce your risk.  Decoding Lab Results  Clinical staff conducted group or individual video education with verbal and written material and guidebook.  Patient learns that lab test reflects one measurement whose values change over time and are influenced by many factors, including medication, stress, sleep, exercise, food, hydration,  pre-existing medical conditions, and more. It is recommended to use the knowledge from this video to become more involved with your lab results and evaluate your numbers to speak with your doctor.   Diseases of Our Time - Overview  Clinical staff conducted group or individual video education with verbal and written material and guidebook.  Patient learns that according to the CDC, 50% to 70% of chronic diseases (such as obesity, type 2 diabetes, elevated lipids, hypertension, and heart disease) are avoidable through lifestyle improvements including healthier food choices, listening to satiety cues, and increased physical activity.  Sleep Disorders Clinical staff conducted group or individual video education with verbal and written material and guidebook.  Patient learns how good quality and duration of sleep are important to overall health and well-being. Patient also learns about sleep disorders and how they impact health along with recommendations to address them, including discussing with a physician.  Nutrition  Dining Out - Part 2 Clinical staff conducted group or individual video education with verbal and written material and guidebook.  Patient learns how to plan ahead and communicate in order to maximize their dining experience in a healthy and nutritious manner. Included are recommended food choices based on the type of restaurant the patient is visiting.   Fueling a Banker conducted group or individual video education with verbal and written material and guidebook.  There is a strong connection between our food choices and our health. Diseases like obesity and type 2 diabetes are very prevalent and are in large-part due to lifestyle choices. The Pritikin Eating Plan provides plenty of food and hunger-curbing satisfaction. It is easy to follow, affordable, and helps reduce health risks.  Menu Workshop  Clinical staff conducted group or individual video education  with verbal  and written material and guidebook.  Patient learns that restaurant meals can sabotage health goals because they are often packed with calories, fat, sodium, and sugar. Recommendations include strategies to plan ahead and to communicate with the manager, chef, or server to help order a healthier meal.  Planning Your Eating Strategy  Clinical staff conducted group or individual video education with verbal and written material and guidebook.  Patient learns about the Pritikin Eating Plan and its benefit of reducing the risk of disease. The Pritikin Eating Plan does not focus on calories. Instead, it emphasizes high-quality, nutrient-rich foods. By knowing the characteristics of the foods, we choose, we can determine their calorie density and make informed decisions.  Targeting Your Nutrition Priorities  Clinical staff conducted group or individual video education with verbal and written material and guidebook.  Patient learns that lifestyle habits have a tremendous impact on disease risk and progression. This video provides eating and physical activity recommendations based on your personal health goals, such as reducing LDL cholesterol, losing weight, preventing or controlling type 2 diabetes, and reducing high blood pressure.  Vitamins and Minerals  Clinical staff conducted group or individual video education with verbal and written material and guidebook.  Patient learns different ways to obtain key vitamins and minerals, including through a recommended healthy diet. It is important to discuss all supplements you take with your doctor.   Healthy Mind-Set    Smoking Cessation  Clinical staff conducted group or individual video education with verbal and written material and guidebook.  Patient learns that cigarette smoking and tobacco addiction pose a serious health risk which affects millions of people. Stopping smoking will significantly reduce the risk of heart disease, lung disease,  and many forms of cancer. Recommended strategies for quitting are covered, including working with your doctor to develop a successful plan.  Culinary   Becoming a Set designer conducted group or individual video education with verbal and written material and guidebook.  Patient learns that cooking at home can be healthy, cost-effective, quick, and puts them in control. Keys to cooking healthy recipes will include looking at your recipe, assessing your equipment needs, planning ahead, making it simple, choosing cost-effective seasonal ingredients, and limiting the use of added fats, salts, and sugars.  Cooking - Breakfast and Snacks  Clinical staff conducted group or individual video education with verbal and written material and guidebook.  Patient learns how important breakfast is to satiety and nutrition through the entire day. Recommendations include key foods to eat during breakfast to help stabilize blood sugar levels and to prevent overeating at meals later in the day. Planning ahead is also a key component.  Cooking - Educational psychologist conducted group or individual video education with verbal and written material and guidebook.  Patient learns eating strategies to improve overall health, including an approach to cook more at home. Recommendations include thinking of animal protein as a side on your plate rather than center stage and focusing instead on lower calorie dense options like vegetables, fruits, whole grains, and plant-based proteins, such as beans. Making sauces in large quantities to freeze for later and leaving the skin on your vegetables are also recommended to maximize your experience.  Cooking - Healthy Salads and Dressing Clinical staff conducted group or individual video education with verbal and written material and guidebook.  Patient learns that vegetables, fruits, whole grains, and legumes are the foundations of the Pritikin Eating Plan.  Recommendations include how to incorporate  each of these in flavorful and healthy salads, and how to create homemade salad dressings. Proper handling of ingredients is also covered. Cooking - Soups and [employer] - Soups and Desserts Clinical staff conducted group or individual video education with verbal and written material and guidebook.  Patient learns that Pritikin soups and desserts make for easy, nutritious, and delicious snacks and meal components that are low in sodium, fat, sugar, and calorie density, while high in vitamins, minerals, and filling fiber. Recommendations include simple and healthy ideas for soups and desserts.   Overview     The Pritikin Solution Program Overview Clinical staff conducted group or individual video education with verbal and written material and guidebook.  Patient learns that the results of the Pritikin Program have been documented in more than 100 articles published in peer-reviewed journals, and the benefits include reducing risk factors for (and, in some cases, even reversing) high cholesterol, high blood pressure, type 2 diabetes, obesity, and more! An overview of the three key pillars of the Pritikin Program will be covered: eating well, doing regular exercise, and having a healthy mind-set.  WORKSHOPS  Exercise: Exercise Basics: Building Your Action Plan Clinical staff led group instruction and group discussion with PowerPoint presentation and patient guidebook. To enhance the learning environment the use of posters, models and videos may be added. At the conclusion of this workshop, patients will comprehend the difference between physical activity and exercise, as well as the benefits of incorporating both, into their routine. Patients will understand the FITT (Frequency, Intensity, Time, and Type) principle and how to use it to build an exercise action plan. In addition, safety concerns and other considerations for exercise and cardiac rehab  will be addressed by the presenter. The purpose of this lesson is to promote a comprehensive and effective weekly exercise routine in order to improve patients' overall level of fitness.   Managing Heart Disease: Your Path to a Healthier Heart Clinical staff led group instruction and group discussion with PowerPoint presentation and patient guidebook. To enhance the learning environment the use of posters, models and videos may be added.At the conclusion of this workshop, patients will understand the anatomy and physiology of the heart. Additionally, they will understand how Pritikin's three pillars impact the risk factors, the progression, and the management of heart disease.  The purpose of this lesson is to provide a high-level overview of the heart, heart disease, and how the Pritikin lifestyle positively impacts risk factors.  Exercise Biomechanics Clinical staff led group instruction and group discussion with PowerPoint presentation and patient guidebook. To enhance the learning environment the use of posters, models and videos may be added. Patients will learn how the structural parts of their bodies function and how these functions impact their daily activities, movement, and exercise. Patients will learn how to promote a neutral spine, learn how to manage pain, and identify ways to improve their physical movement in order to promote healthy living. The purpose of this lesson is to expose patients to common physical limitations that impact physical activity. Participants will learn practical ways to adapt and manage aches and pains, and to minimize their effect on regular exercise. Patients will learn how to maintain good posture while sitting, walking, and lifting.  Balance Training and Fall Prevention  Clinical staff led group instruction and group discussion with PowerPoint presentation and patient guidebook. To enhance the learning environment the use of posters, models and videos  may be added. At the conclusion of this workshop, patients  will understand the importance of their sensorimotor skills (vision, proprioception, and the vestibular system) in maintaining their ability to balance as they age. Patients will apply a variety of balancing exercises that are appropriate for their current level of function. Patients will understand the common causes for poor balance, possible solutions to these problems, and ways to modify their physical environment in order to minimize their fall risk. The purpose of this lesson is to teach patients about the importance of maintaining balance as they age and ways to minimize their risk of falling.  WORKSHOPS   Nutrition:  Fueling a Ship broker led group instruction and group discussion with PowerPoint presentation and patient guidebook. To enhance the learning environment the use of posters, models and videos may be added. Patients will review the foundational principles of the Pritikin Eating Plan and understand what constitutes a serving size in each of the food groups. Patients will also learn Pritikin-friendly foods that are better choices when away from home and review make-ahead meal and snack options. Calorie density will be reviewed and applied to three nutrition priorities: weight maintenance, weight loss, and weight gain. The purpose of this lesson is to reinforce (in a group setting) the key concepts around what patients are recommended to eat and how to apply these guidelines when away from home by planning and selecting Pritikin-friendly options. Patients will understand how calorie density may be adjusted for different weight management goals.  Mindful Eating  Clinical staff led group instruction and group discussion with PowerPoint presentation and patient guidebook. To enhance the learning environment the use of posters, models and videos may be added. Patients will briefly review the concepts of the Pritikin  Eating Plan and the importance of low-calorie dense foods. The concept of mindful eating will be introduced as well as the importance of paying attention to internal hunger signals. Triggers for non-hunger eating and techniques for dealing with triggers will be explored. The purpose of this lesson is to provide patients with the opportunity to review the basic principles of the Prit[facility]in Eating Plan, discuss the value of eating mindfully and how to measure internal cues of hunger and fullness using the Hunger Scale. Patients will also discuss reasons for non-hunger eating and learn strategies to use for controlling emotional eating.  Targeting Your Nutrition Priorities Clinical staff led group instruction and group discussion with PowerPoint presentation and patient guidebook. To enhance the learning environment the use of posters, models and videos may be added. Patients will learn how to determine their genetic susceptibility to disease by reviewing their family history. Patients will gain insight into the importance of diet as part of an overall healthy lifestyle in mitigating the impact of genetics and other environmental insults. The purpose of this lesson is to provide patients with the opportunity to assess their personal nutrition priorities by looking at their family history, their own health history and current risk factors. Patients will also be able to discuss ways of prioritizing and modifying the P[facility]in Eating Plan for their highest risk areas  Menu  Clinical staff led group instruction and group discussion with PowerPoint presentation and patient guidebook. To enhance the learning environment the use of posters, models and videos may be added. Using menus brought in from [employer], or printed from [employer], patients will apply the Pritikin dining out guidelines that were presented in the [employer] video. Patients will also be able to practice these guidelines  in a variety of provided scenarios. The  purpose of this lesson is to provide patients with the opportunity to practice hands-on learning of the P[facility]in Dining Out guidelines with actual menus and practice scenarios.  Label Reading Clinical staff led group instruction and group discussion with PowerPoint presentation and patient guidebook. To enhance the learning environment the use of posters, models and videos may be added. Patients will review and discuss the [facility]rit[facility]in label reading guidelines presented in [employer]'s Label Reading Educational series video. Using fool labels brought in from local grocery stores and markets, patients will apply the label reading guidelines and determine if the packaged food meet the Pritikin guidelines. The purpose of this lesson is to provide patients with the opportunity to review, discuss, and practice hands-on learning of the Prit[facility]in Label Reading guidelines with actual packaged food labels. Cooking School  [facility]'s [employer] are designed to teach patients ways to prepare quick, simple, and affordable recipes at home. The importance of nutrition's role in chronic disease risk reduction is reflected in its emphasis in the overall Prit[facility]in program. By learning how to prepare essential core Pritikin Eating Plan recipes, patients will increase control over what they eat; be able to customize the flavor of foods without the use of added salt, sugar, or fat; and improve the quality of the food they consume. By learning a set of core recipes which are easily assembled, quickly prepared, and affordable, patients are more likely to prepare more healthy foods at home. These workshops focus on convenient breakfasts, simple entres, side dishes, and desserts which can be prepared with minimal effort and are consistent with nutrition recommendations for cardiovascular risk reduction. Cooking [employer] are taught by a Armed forces logistics/support/administrative officer (RD) who has  been trained by the [employer]. The chef or RD has a clear understanding of the importance of minimizing - if not completely eliminating - added fat, sugar, and sodium in recipes. Throughout the series of Cooking School Workshop sessions, patients will learn about healthy ingredients and efficient methods of cooking to build confidence in their capability to prepare    Cooking School weekly topics:  Adding Flavor- Sodium-Free  Fast and Healthy Breakfasts  Powerhouse Plant-Based Proteins  Satisfying Salads and Dressings  Simple Sides and Sauces  International Cuisine-Spotlight on the [employer] Zones  Delicious Desserts  Savory Soups  [employer] - Meals in a Astronomer Appetizers and Snacks  Comforting Weekend Breakfasts  One-Pot Wonders   Fast Evening Meals  Landscape architect Your Pritikin Plate  WORKSHOPS   Healthy Mindset (Psychosocial):  Focused Goals, Sustainable Changes Clinical staff led group instruction and group discussion with PowerPoint presentation and patient guidebook. To enhance the learning environment the use of posters, models and videos may be added. Patients will be able to apply effective goal setting strategies to establish at least one personal goal, and then take consistent, meaningful action toward that goal. They will learn to identify common barriers to achieving personal goals and develop strategies to overcome them. Patients will also gain an understanding of how our mind-set can impact our ability to achieve goals and the importance of cultivating a positive and growth-oriented mind-set. The purpose of this lesson is to provide patients with a deeper understanding of how to set and achieve personal goals, as well as the tools and strategies needed to overcome common obstacles which may arise along the way.  From Head to Heart: The Power of a Healthy Outlook  Clinical staff led group instruction and group discussion with  PowerPoint presentation and patient guidebook. To enhance the learning environment the use of posters, models and videos may be added. Patients will be able to recognize and describe the impact of emotions and mood on physical health. They will discover the importance of self-care and explore self-care practices which may work for them. Patients will also learn how to utilize the 4 C's to cultivate a healthier outlook and better manage stress and challenges. The purpose of this lesson is to demonstrate to patients how a healthy outlook is an essential part of maintaining good health, especially as they continue their cardiac rehab journey.  Healthy Sleep for a Healthy Heart Clinical staff led group instruction and group discussion with PowerPoint presentation and patient guidebook. To enhance the learning environment the use of posters, models and videos may be added. At the conclusion of this workshop, patients will be able to demonstrate knowledge of the importance of sleep to overall health, well-being, and quality of life. They will understand the symptoms of, and treatments for, common sleep disorders. Patients will also be able to identify daytime and nighttime behaviors which impact sleep, and they will be able to apply these tools to help manage sleep-related challenges. The purpose of this lesson is to provide patients with a general overview of sleep and outline the importance of quality sleep. Patients will learn about a few of the most common sleep disorders. Patients will also be introduced to the concept of "sleep hygiene," and discover ways to self-manage certain sleeping problems through simple daily behavior changes. Finally, the workshop will motivate patients by clarifying the links between quality sleep and their goals of heart-healthy living.   Recognizing and Reducing Stress Clinical staff led group instruction and group discussion with PowerPoint presentation and patient guidebook. To  enhance the learning environment the use of posters, models and videos may be added. At the conclusion of this workshop, patients will be able to understand the types of stress reactions, differentiate between acute and chronic stress, and recognize the impact that chronic stress has on their health. They will also be able to apply different coping mechanisms, such as reframing negative self-talk. Patients will have the opportunity to practice a variety of stress management techniques, such as deep abdominal breathing, progressive muscle relaxation, and/or guided imagery.  The purpose of this lesson is to educate patients on the role of stress in their lives and to provide healthy techniques for coping with it.  Learning Barriers/Preferences:  Learning Barriers/Preferences - 01/07/23 1316       Learning Barriers/Preferences   Learning Barriers None    Learning Preferences Verbal Instruction;Video;Computer/Internet;Written Material;Audio;Group Instruction;Individual Instruction;Pictoral;Skilled Demonstration             Education Topics:  Knowledge Questionnaire Score:  Knowledge Questionnaire Score - 01/07/23 1318       Knowledge Questionnaire Score   Pre Score 18/24             Core Components/Risk Factors/Patient Goals at Admission:  Personal Goals and Risk Factors at Admission - 01/07/23 1316       Core Components/Risk Factors/Patient Goals on Admission    Weight Management Yes;Obesity;Weight Loss    Intervention Weight Management: Develop a combined nutrition and exercise program designed to reach desired caloric intake, while maintaining appropriate intake of nutrient and fiber, sodium and fats, and appropriate energy expenditure required for the weight goal.;Weight Management: Provide education and appropriate resources to help participant work on and attain dietary goals.;Weight Management/Obesity: Establish reasonable short term and long  term weight goals.    Admit  Weight 222 lb 7.1 oz (100.9 kg)    Goal Weight: Long Term 199 lb (90.3 kg)   Pt goal   Expected Outcomes Short Term: Continue to assess and modify interventions until short term weight is achieved;Long Term: Adherence to nutrition and physical activity/exercise program aimed toward attainment of established weight goal;Weight Maintenance: Understanding of the daily nutrition guidelines, which includes 25-35% calories from fat, 7% or less cal from saturated fats, less than 200mg  cholesterol, less than 1.5gm of sodium, & 5 or more servings of fruits and vegetables daily;Weight Loss: Understanding of general recommendations for a balanced deficit meal plan, which promotes 1-2 lb weight loss per week and includes a negative energy balance of [phone] kcal/d;Understanding recommendations for meals to include 15-35% energy as protein, 25-35% energy from fat, 35-60% energy from carbohydrates, less than 200mg  of dietary cholesterol, 20-35 gm of total fiber daily;Understanding of distribution of calorie intake throughout the day with the consumption of 4-5 meals/snacks    Diabetes Yes    Intervention Provide education about signs/symptoms and action to take for hypo/hyperglycemia.;Provide education about proper nutrition, including hydration, and aerobic/resistive exercise prescription along with prescribed medications to achieve blood glucose in normal ranges: Fasting glucose 65-99 mg/dL    Expected Outcomes Short Term: Participant verbalizes understanding of the signs/symptoms and immediate care of hyper/hypoglycemia, proper foot care and importance of medication, aerobic/resistive exercise and nutrition plan for blood glucose control.;Long Term: Attainment of HbA1C < 7%.    Hypertension Yes    Intervention Provide education on lifestyle modifcations including regular physical activity/exercise, weight management, moderate sodium restriction and increased consumption of fresh fruit, vegetables, and low fat dairy,  alcohol moderation, and smoking cessation.;Monitor prescription use compliance.    Expected Outcomes Short Term: Continued assessment and intervention until BP is < 140/94mm HG in hypertensive participants. < 130/4mm HG in hypertensive participants with diabetes, heart failure or chronic kidney disease.;Long Term: Maintenance of blood pressure at goal levels.    Lipids Yes    Intervention Provide education and support for participant on nutrition & aerobic/resistive exercise along with prescribed medications to achieve LDL 70mg , HDL >40mg .    Expected Outcomes Short Term: Participant states understanding of desired cholesterol values and is compliant with medications prescribed. Participant is following exercise prescription and nutrition guidelines.;Long Term: Cholesterol controlled with medications as prescribed, with individualized exercise RX and with personalized nutrition plan. Value goals: LDL < 70mg , HDL > 40 mg.    Stress Yes    Intervention Offer individual and/or small group education and counseling on adjustment to heart disease, stress management and health-related lifestyle change. Teach and support self-help strategies.;Refer participants experiencing significant psychosocial distress to appropriate mental health specialists for further evaluation and treatment. When possible, include family members and significant others in education/counseling sessions.    Expected Outcomes Short Term: Participant demonstrates changes in health-related behavior, relaxation and other stress management skills, ability to obtain effective social support, and compliance with psychotropic medications if prescribed.;Long Term: Emotional wellbeing is indicated by absence of clinically significant psychosocial distress or social isolation.             Core Components/Risk Factors/Patient Goals Review:   Goals and Risk Factor Review     Row Name 01/13/23 1443             Core Components/Risk  Factors/Patient Goals Review   Personal Goals Review Weight Management/Obesity;Lipids;Diabetes;Hypertension;Stress       Review Brendan [name] started intensive cardiac rehab  on 01/13/23. Brendan [name] did well with exercise, vital signs and CBG's were stable. Emotional support and encouragment  provided as Brendan [name] said he was nervous about exercise.       Expected Outcomes Brendan [name] will continue to participate in intensive cardiac rehab for exercise, nutrition and lifestyle modifications                Core Components/Risk Factors/Patient Goals at Discharge (Final Review):   Goals and Risk Factor Review - 01/13/23 1443       Core Components/Risk Factors/Patient Goals Review   Personal Goals Review Weight Management/Obesity;Lipids;Diabetes;Hypertension;Stress    Review Brendan [name] started intensive cardiac rehab on 01/13/23. Brendan [name] did well with exercise, vital signs and CBG's were stable. Emotional support and encouragment  provided as Brendan [name] said he was nervous about exercise.    Expected Outcomes Brendan [name] will continue to participate in intensive cardiac rehab for exercise, nutrition and lifestyle modifications             ITP Comments:  ITP Comments     Row Name 01/07/23 1308 01/13/23 1439         ITP Comments Armanda Magic, MD; Medical Director. Introduction to the [facility] Education Program/Intensive Cardiac Rehab. Initial orientation packet reviewed with the patient. 30 Day ITP Review. Brendan [name] started intensive cardiac rehab on 01/13/23 and did well with exercise.               Comments: See ITP comments.

## 2023-02-07 ENCOUNTER — Encounter (HOSPITAL_COMMUNITY)
Admission: RE | Admit: 2023-02-07 | Discharge: 2023-02-07 | Disposition: A | Discharge status: Home / Self Care | Payer: No Typology Code available for payment source | Source: Ambulatory Visit | Attending: Cardiology | Admitting: Cardiology

## 2023-02-07 DIAGNOSIS — I2089 Other forms of angina pectoris: Secondary | ICD-10-CM | POA: Diagnosis not present

## 2023-02-07 DIAGNOSIS — Z4889 Encounter for other specified surgical aftercare: Secondary | ICD-10-CM | POA: Diagnosis not present

## 2023-02-10 ENCOUNTER — Encounter (HOSPITAL_COMMUNITY)
Admission: RE | Admit: 2023-02-10 | Discharge: 2023-02-10 | Disposition: A | Discharge status: Home / Self Care | Payer: No Typology Code available for payment source | Source: Ambulatory Visit | Attending: Cardiology | Admitting: Cardiology

## 2023-02-10 DIAGNOSIS — Z4889 Encounter for other specified surgical aftercare: Secondary | ICD-10-CM | POA: Diagnosis not present

## 2023-02-10 DIAGNOSIS — I2089 Other forms of angina pectoris: Secondary | ICD-10-CM

## 2023-02-12 ENCOUNTER — Encounter (HOSPITAL_COMMUNITY)
Admission: RE | Admit: 2023-02-12 | Discharge: 2023-02-12 | Disposition: A | Discharge status: Home / Self Care | Payer: No Typology Code available for payment source | Source: Ambulatory Visit | Attending: Cardiology | Admitting: Cardiology

## 2023-02-12 DIAGNOSIS — Z4889 Encounter for other specified surgical aftercare: Secondary | ICD-10-CM | POA: Diagnosis not present

## 2023-02-12 DIAGNOSIS — I2089 Other forms of angina pectoris: Secondary | ICD-10-CM

## 2023-02-14 ENCOUNTER — Encounter (HOSPITAL_COMMUNITY)
Admission: RE | Admit: 2023-02-14 | Discharge: 2023-02-14 | Disposition: A | Discharge status: Home / Self Care | Payer: No Typology Code available for payment source | Source: Ambulatory Visit | Attending: Cardiology | Admitting: Cardiology

## 2023-02-14 DIAGNOSIS — Z4889 Encounter for other specified surgical aftercare: Secondary | ICD-10-CM | POA: Diagnosis not present

## 2023-02-14 DIAGNOSIS — I2089 Other forms of angina pectoris: Secondary | ICD-10-CM

## 2023-02-17 ENCOUNTER — Encounter (HOSPITAL_COMMUNITY): Payer: No Typology Code available for payment source

## 2023-02-17 DIAGNOSIS — F329 Major depressive disorder, single episode, unspecified: Secondary | ICD-10-CM | POA: Diagnosis not present

## 2023-02-19 ENCOUNTER — Encounter (HOSPITAL_COMMUNITY)
Admission: RE | Admit: 2023-02-19 | Discharge: 2023-02-19 | Disposition: A | Discharge status: Home / Self Care | Payer: No Typology Code available for payment source | Source: Ambulatory Visit | Attending: Cardiology | Admitting: Cardiology

## 2023-02-19 DIAGNOSIS — I2089 Other forms of angina pectoris: Secondary | ICD-10-CM | POA: Diagnosis not present

## 2023-02-19 DIAGNOSIS — Z4889 Encounter for other specified surgical aftercare: Secondary | ICD-10-CM | POA: Diagnosis not present

## 2023-02-21 ENCOUNTER — Encounter (HOSPITAL_COMMUNITY)
Admission: RE | Admit: 2023-02-21 | Discharge: 2023-02-21 | Disposition: A | Discharge status: Home / Self Care | Payer: No Typology Code available for payment source | Source: Ambulatory Visit | Attending: Cardiology | Admitting: Cardiology

## 2023-02-21 DIAGNOSIS — I2089 Other forms of angina pectoris: Secondary | ICD-10-CM | POA: Diagnosis not present

## 2023-02-21 DIAGNOSIS — Z4889 Encounter for other specified surgical aftercare: Secondary | ICD-10-CM | POA: Diagnosis not present

## 2023-02-24 ENCOUNTER — Encounter (HOSPITAL_COMMUNITY)
Admission: RE | Admit: 2023-02-24 | Discharge: 2023-02-24 | Disposition: A | Discharge status: Home / Self Care | Payer: No Typology Code available for payment source | Source: Ambulatory Visit | Attending: Cardiology | Admitting: Cardiology

## 2023-02-24 DIAGNOSIS — I2089 Other forms of angina pectoris: Secondary | ICD-10-CM | POA: Diagnosis not present

## 2023-02-24 DIAGNOSIS — Z4889 Encounter for other specified surgical aftercare: Secondary | ICD-10-CM | POA: Diagnosis not present

## 2023-02-25 NOTE — Progress Notes (Signed)
Reviewed home exercise Rx with patient today.  Encouraged warm-up, cool-down, and stretching. Reviewed THRR of  63 - 126 and keeping RPE between 11-13. Encouraged to hydrate with activity.  Reviewed weather parameters for temperature and humidity for safe exercise outdoors. Reviewed S/S to terminate exercise and when to call 911 vs MD. Pt encouraged to always carry a cell phone for safety when exercising outdoors. Pt verbalized understanding of the home exercise Rx and was provided a copy.   Derward Marple MS, ACSM-CEP, CCRP   

## 2023-02-26 ENCOUNTER — Encounter (HOSPITAL_COMMUNITY)
Admission: RE | Admit: 2023-02-26 | Discharge: 2023-02-26 | Disposition: A | Discharge status: Home / Self Care | Payer: No Typology Code available for payment source | Source: Ambulatory Visit | Attending: Cardiology | Admitting: Cardiology

## 2023-02-26 DIAGNOSIS — I2089 Other forms of angina pectoris: Secondary | ICD-10-CM | POA: Insufficient documentation

## 2023-02-28 ENCOUNTER — Encounter (HOSPITAL_COMMUNITY): Payer: No Typology Code available for payment source

## 2023-03-03 ENCOUNTER — Encounter (HOSPITAL_COMMUNITY): Admission: RE | Admit: 2023-03-03 | Payer: No Typology Code available for payment source | Source: Ambulatory Visit

## 2023-03-04 NOTE — Progress Notes (Signed)
Cardiac Individual Treatment Plan  Patient Details  Name: Brendan [name] MRN: [id number] Date of Birth: 1961-09-01 Referring Provider:   Flowsheet Row INTENSIVE CARDIAC REHAB ORIENT from 01/07/2023 in [facility]  Referring Provider Peter [place], MD       Initial Encounter Date:  Flowsheet Row INTENSIVE CARDIAC REHAB ORIENT from 01/07/2023 in [facility] for Heart, V[facility], [facility]  Date 01/07/23       Visit Diagnosis: Chronic stable angina  Patient's Home Medications on Admission:  Current Outpatient Medications:    albuterol (VENTOLIN HFA) 108 (90 Base) MCG/ACT inhaler, Inhale 1-2 puffs into the lungs every 6 (six) hours as needed for shortness of breath or wheezing., Disp: , Rfl:    amoxicillin (AMOXIL) 500 MG capsule, TAKE FOUR CAPSULES BY MOUTH ONCE 1 HOUR PRIOR TO THE DENTAL TREATMENT, Disp: 4 capsule, Rfl: 3   aspirin EC 81 MG tablet, Take 81 mg by mouth at bedtime. Swallow whole., Disp: , Rfl:    atorvastatin (LIPITOR) 40 MG tablet, Take 20 mg by mouth at bedtime., Disp: , Rfl:    benzonatate (TESSALON) 200 MG capsule, Take 1 capsule (200 mg total) by mouth 3 (three) times daily as needed., Disp: 60 capsule, Rfl: 1   clobetasol cream (TEMOVATE) 0.05 %, SMARTSIG:1 Topical Daily, Disp: , Rfl:    diazepam (VALIUM) 5 MG tablet, Take 5 mg by mouth daily as needed for anxiety., Disp: , Rfl:    EPINEPHrine 0.3 mg/0.3 mL IJ SOAJ injection, Inject 0.3 mg into the muscle as needed for anaphylaxis., Disp: 1 each, Rfl: 0   escitalopram (LEXAPRO) 10 MG tablet, Take 10 mg by mouth at bedtime., Disp: , Rfl:    glimepiride (AMARYL) 4 MG tablet, Take 4 mg by mouth every evening., Disp: , Rfl:    mometasone-formoterol (DULERA) 100-5 MCG/ACT AERO, Inhale 2 puffs into the lungs in the morning and at bedtime., Disp: 26.4 g, Rfl: 11   Semaglutide,0.25 or 0.5MG /DOS, 2 MG/3ML SOPN, Inject 0.25 mg into the  skin once a week., Disp: , Rfl:    traZODone (DESYREL) 100 MG tablet, Take 100 mg by mouth at bedtime., Disp: , Rfl:    zolpidem (AMBIEN) 10 MG tablet, Take 10 mg by mouth at bedtime., Disp: , Rfl:   Past Medical History: Past Medical History:  Diagnosis Date   Coronary artery disease    Diabetes mellitus without complication (HCC)    Hyperlipidemia    Hypertension    Kidney disease    Sarcoidosis    Stroke (HCC)     Tobacco Use: Social History   Tobacco Use  Smoking Status Never  Smokeless Tobacco Never    Labs: Review Flowsheet       Latest Ref Rng & Units 04/03/2008 08/01/2022  Labs for ITP Cardiac and Pulmonary Rehab  PH, Arterial 7.35 - 7.45 - 7.398   PCO2 arterial 32 - 48 mmHg - 37.1   Bicarbonate 20.0 - 28.0 mmol/L - 23.3  24.6  23.1  22.8   TCO2 22 - 32 mmol/L 25  25  26  24  24    Acid-base deficit 0.0 - 2.0 mmol/L - 2.0  1.0  2.0  2.0   O2 Saturation % - 79  75  81  98     Capillary Blood Glucose: Lab Results  Component Value Date   GLUCAP 173 (H) 01/27/2023   GLUCAP 222 (H) 01/17/2023   GLUCAP 173 (  H) 01/15/2023   GLUCAP 248 (H) 01/15/2023   GLUCAP 132 (H) 01/13/2023     Exercise Target Goals: Exercise Program Goal: Individual exercise prescription set using results from initial 6 min walk test and THRR while considering  patient's activity barriers and safety.   Exercise Prescription Goal: Initial exercise prescription builds to 30-45 minutes a day of aerobic activity, 2-3 days per week.  Home exercise guidelines will be given to patient during program as part of exercise prescription that the participant will acknowledge.  Activity Barriers & Risk Stratification:  Activity Barriers & Cardiac Risk Stratification - 01/07/23 1311       Activity Barriers & Cardiac Risk Stratification   Activity Barriers Left Knee Replacement;Right Knee Replacement;Deconditioning;Back Problems;Balance Concerns;Shortness of Breath;Joint Problems    Cardiac Risk  Stratification High             6 Minute Walk:  6 Minute Walk     Row Name 01/07/23 1043         6 Minute Walk   Phase Initial     Distance 1410 feet     Walk Time 6 minutes     # of Rest Breaks 0     MPH 2.7     METS 3.63     RPE 10     Perceived Dyspnea  0     VO2 Peak 12.7     Symptoms No     Resting HR 81 bpm     Resting BP 108/70     Resting Oxygen Saturation  98 %     Exercise Oxygen Saturation  during 6 min walk 98 %     Max Ex. HR 119 bpm     Max Ex. BP 138/60     2 Minute Post BP 112/62       Interval HR   1 Minute HR 92     2 Minute HR 107     3 Minute HR 111     4 Minute HR 113     5 Minute HR 116     6 Minute HR 117     Interval Heart Rate? Yes       Interval Oxygen   Interval Oxygen? Yes     Baseline Oxygen Saturation % 98 %     1 Minute Oxygen Saturation % 94 %     1 Minute Liters of Oxygen 0 L     2 Minute Oxygen Saturation % 94 %     2 Minute Liters of Oxygen 0 L     3 Minute Oxygen Saturation % 94 %     3 Minute Liters of Oxygen 0 L     4 Minute Oxygen Saturation % 93 %     4 Minute Liters of Oxygen 0 L     5 Minute Oxygen Saturation % 96 %     5 Minute Liters of Oxygen 0 L     6 Minute Oxygen Saturation % 98 %     6 Minute Liters of Oxygen 0 L     2 Minute Post Oxygen Saturation % 100 %              Oxygen Initial Assessment:   Oxygen Re-Evaluation:   Oxygen Discharge (Final Oxygen Re-Evaluation):   Initial Exercise Prescription:  Initial Exercise Prescription - 01/07/23 1300       Date of Initial Exercise RX and Referring Provider   Date 01/07/23    Referring Provider Theron Arista  [place], MD    Expected Discharge Date 03/21/23      Recumbant Bike   Level 2    RPM 60    Watts 52    Minutes 15    METs 3.6      Arm Ergometer   Level 1.5    Watts 64    RPM 60    Minutes 15    METs 3.6      Prescription Details   Frequency (times per week) 3    Duration Progress to 30 minutes of continuous aerobic without  signs/symptoms of physical distress      Intensity   THRR 40-80% of Max Heartrate 63-126    Ratings of Perceived Exertion 11-13    Perceived Dyspnea 0-4      Progression   Progression Continue progressive overload as per policy without signs/symptoms or physical distress.      Resistance Training   Training Prescription Yes    Weight 4 lbs    Reps 10-15             Perform Capillary Blood Glucose checks as needed.  Exercise Prescription Changes:   Exercise Prescription Changes     Row Name 01/13/23 1600 01/31/23 1600 02/10/23 1511 02/24/23 1433       Response to Exercise   Blood Pressure (Admit) 110/70 108/58 110/58 130/72    Blood Pressure (Exercise) 138/72 164/64 142/70 170/78    Blood Pressure (Exit) 104/70 102/58 112/70 98/44    Heart Rate (Admit) 84 bpm 83 bpm 94 bpm 94 bpm    Heart Rate (Exercise) 126 bpm 136 bpm 127 bpm 124 bpm    Heart Rate (Exit) 93 bpm 92 bpm 103 bpm 93 bpm    Rating of Perceived Exertion (Exercise) 11 11 11 11     Symptoms None None None None    Comments Pt's first day in the CRP2 program Reviewed METs Reviewed METs and goals Reviewed home exercise Rx    Duration Continue with 30 min of aerobic exercise without signs/symptoms of physical distress. Continue with 30 min of aerobic exercise without signs/symptoms of physical distress. Continue with 30 min of aerobic exercise without signs/symptoms of physical distress. Continue with 30 min of aerobic exercise without signs/symptoms of physical distress.    Intensity THRR unchanged THRR unchanged THRR unchanged THRR unchanged      Progression   Progression Continue to progress workloads to maintain intensity without signs/symptoms of physical distress. Continue to progress workloads to maintain intensity without signs/symptoms of physical distress. Continue to progress workloads to maintain intensity without signs/symptoms of physical distress. Continue to progress workloads to maintain intensity  without signs/symptoms of physical distress.    Average METs 2.35 2.4 2.4 2.4      Resistance Training   Training Prescription Yes Yes Yes Yes    Weight 4 lbs 4 lbs 4 lbs 4 lbs    Reps 10-15 10-15 10-15 10-15    Time 10 Minutes 10 Minutes 10 Minutes 10 Minutes      Interval Training   Interval Training No No No No      Recumbant Bike   Level 2 2.5 2.5 3    RPM -- 61 -- 53    Watts -- 16 -- 33    Minutes 15 15 15 15     METs 2.3 2.3 2.1 2.2      Arm Ergometer   Level 2.5 2.5 2.5 3    Watts 14 22 19  --  RPM 58 63 57 --    Minutes 15 15 15 15     METs 2.4 2.5 2.4 2.5      Home Exercise Plan   Plans to continue exercise at -- -- -- Home (comment)    Frequency -- -- -- Add 2 additional days to program exercise sessions.    Initial Home Exercises Provided -- -- -- 02/24/23             Exercise Comments:   Exercise Comments     Row Name 01/13/23 1637 01/31/23 1406 02/10/23 1517 02/24/23 1436     Exercise Comments Pt's first day in the CRP2 program. Pt had no complaints with todays session. Increased workload on arm ergometer due to low RPE. Reviewed METs. Pt is progressing in the CRP2 program. Reviewed EMTs and goals. Slow progress to increase METs. Encouraged workload increases next session. Reviewed home exercise Rx with patient today. Pt palns to walk or use his treadmill. Encouraged 30-45 minutes 5x/week.  Pt verbalized understnading of the home exercise Rx and was given a copy.             Exercise Goals and Review:   Exercise Goals     Row Name 01/07/23 1314             Exercise Goals   Increase Physical Activity Yes       Intervention Provide advice, education, support and counseling about physical activity/exercise needs.;Develop an individualized exercise prescription for aerobic and resistive training based on initial evaluation findings, risk stratification, comorbidities and participant's personal goals.       Expected Outcomes Short Term: Attend  rehab on a regular basis to increase amount of physical activity.;Long Term: Add in home exercise to make exercise part of routine and to increase amount of physical activity.;Long Term: Exercising regularly at least 3-5 days a week.       Increase Strength and Stamina Yes       Intervention Provide advice, education, support and counseling about physical activity/exercise needs.;Develop an individualized exercise prescription for aerobic and resistive training based on initial evaluation findings, risk stratification, comorbidities and participant's personal goals.       Expected Outcomes Short Term: Increase workloads from initial exercise prescription for resistance, speed, and METs.;Short Term: Perform resistance training exercises routinely during rehab and add in resistance training at home;Long Term: Improve cardiorespiratory fitness, muscular endurance and strength as measured by increased METs and functional capacity ( )       Able to understand and use rate of perceived exertion (RPE) scale Yes       Intervention Provide education and explanation on how to use RPE scale       Expected Outcomes Short Term: Able to use RPE daily in rehab to express subjective intensity level;Long Term:  Able to use RPE to guide intensity level when exercising independently       Knowledge and understanding of Target Heart Rate Range (THRR) Yes       Intervention Provide education and explanation of THRR including how the numbers were predicted and where they are located for reference       Expected Outcomes Short Term: Able to state/look up THRR;Short Term: Able to use daily as guideline for intensity in rehab;Long Term: Able to use THRR to govern intensity when exercising independently       Understanding of Exercise Prescription Yes       Intervention Provide education, explanation, and written materials on patient's individual exercise prescription  Expected Outcomes Short Term: Able to explain program  exercise prescription;Long Term: Able to explain home exercise prescription to exercise independently                Exercise Goals Re-Evaluation :  Exercise Goals Re-Evaluation     Row Name 01/13/23 1636 02/10/23 1513           Exercise Goal Re-Evaluation   Exercise Goals Review Increase Physical Activity;Understanding of Exercise Prescription;Increase Strength and Stamina;Knowledge and understanding of Target Heart Rate Range (THRR);Able to understand and use rate of perceived exertion (RPE) scale Increase Physical Activity;Understanding of Exercise Prescription;Increase Strength and Stamina;Knowledge and understanding of Target Heart Rate Range (THRR);Able to understand and use rate of perceived exertion (RPE) scale      Comments Pt's first day in the cRP2 program. Pt understands the exercise Rx, THRR and RPE scale. Reviewed METs and goals. Pt voices he feels that strength and stamina are improving. Pt has not achieved any significant weight loss.      Expected Outcomes Will continue to monitor the patient and progress exercise workloads as tolerated. Will continue to monitor the patient and progress exercise workloads as tolerated.               Discharge Exercise Prescription (Final Exercise Prescription Changes):  Exercise Prescription Changes - 02/24/23 1433       Response to Exercise   Blood Pressure (Admit) 130/72    Blood Pressure (Exercise) 170/78    Blood Pressure (Exit) 98/44    Heart Rate (Admit) 94 bpm    Heart Rate (Exercise) 124 bpm    Heart Rate (Exit) 93 bpm    Rating of Perceived Exertion (Exercise) 11    Symptoms None    Comments Reviewed home exercise Rx    Duration Continue with 30 min of aerobic exercise without signs/symptoms of physical distress.    Intensity THRR unchanged      Progression   Progression Continue to progress workloads to maintain intensity without signs/symptoms of physical distress.    Average METs 2.4      Resistance  Training   Training Prescription Yes    Weight 4 lbs    Reps 10-15    Time 10 Minutes      Interval Training   Interval Training No      Recumbant Bike   Level 3    RPM 53    Watts 33    Minutes 15    METs 2.2      Arm Ergometer   Level 3    Minutes 15    METs 2.5      Home Exercise Plan   Plans to continue exercise at Home (comment)    Frequency Add 2 additional days to program exercise sessions.    Initial Home Exercises Provided 02/24/23             Nutrition:  Target Goals: Understanding of nutrition guidelines, daily intake of sodium 1500mg , cholesterol 200mg , calories 30% from fat and 7% or less from saturated fats, daily to have 5 or more servings of fruits and vegetables.  Biometrics:  Pre Biometrics - 01/07/23 0945       Pre Biometrics   Waist Circumference 41.75 inches    Hip Circumference 41.5 inches    Waist to Hip Ratio 1.01 %    Triceps Skinfold 18 mm    % Body Fat 29.7 %    Grip Strength 54 kg    Flexibility 11.75 in  Single Leg Stand 13.43 seconds              Nutrition Therapy Plan and Nutrition Goals:  Nutrition Therapy & Goals - 02/14/23 0841       Nutrition Therapy   Diet Heart Healthy/Carbohydrate Consistent    Drug/Food Interactions Statins/Certain Fruits      Personal Nutrition Goals   Nutrition Goal Patient to improve diet quality by using the plate method as a guide for meal planning to include lean protein/plant protein, fruits, vegetables, whole grains, nonfat dairy as part of a well-balanced diet.    Personal Goal #2 Patient to identify strategies for reducing cardiovascular risk by attending the [employer] education and nutrition series weekly.    Personal Goal #3 Patient to identify strategies for improving blood sugar control with goals <7%    Comments Goals in action. Brendan [name] continues to attend the [employer] and nutrition series regularly. He is currently taking semaglutide to aid with blood sugar control and  weight loss. He is down 5.3# since starting with our program. He continues to work toward increasing high fiber foods, moderation of saturated fat, and decreased sodium. Patient will benefit from participation in intensive cardiac rehab for nutrition, exercise, and lifestyle modification      Intervention Plan   Intervention Prescribe, educate and counsel regarding individualized specific dietary modifications aiming towards targeted core components such as weight, hypertension, lipid management, diabetes, heart failure and other comorbidities.;Nutrition handout(s) given to patient.    Expected Outcomes Short Term Goal: Understand basic principles of dietary content, such as calories, fat, sodium, cholesterol and nutrients.;Long Term Goal: Adherence to prescribed nutrition plan.             Nutrition Assessments:  Nutrition Assessments - 01/17/23 1044       Rate Your Plate Scores   Pre Score 50            MEDIFICTS Score Key: ?70 Need to make dietary changes  40-70 Heart Healthy Diet ? 40 Therapeutic Level Cholesterol Diet   Flowsheet Row INTENSIVE CARDIAC REHAB from 01/15/2023 in [facility] for Heart, Vascular, & Lung Health  Picture Your Plate Total Score on Admission 50      Picture Your Plate Scores: <09 Unhealthy dietary pattern with much room for improvement. 41-50 Dietary pattern unlikely to meet recommendations for good health and room for improvement. 51-60 More healthful dietary pattern, with some room for improvement.  >60 Healthy dietary pattern, although there may be some specific behaviors that could be improved.    Nutrition Goals Re-Evaluation:  Nutrition Goals Re-Evaluation     Row Name 01/13/23 1423 02/14/23 0841           Goals   Current Weight 224 lb 3.3 oz (101.7 kg) 217 lb 2.5 oz (98.5 kg)      Comment lipids WNL, A1c 8.4, Cr1.6, GFR 47 No new labs; most recent labs lipids WNL, A1c 8.4, Cr1.6, GFR 47      Expected  Outcome Brendan [name] reports motivation to lose weight with goal weight of 205#. He is currently taking semaglutide to aid with blood sugar control and weight loss. He reports eating two meals daily and wife does the majority of cooking. Patient will benefit from participation in intensive cardiac rehab for nutrition, exercise, and lifestyle modification Goals in action. Brendan [name] continues to attend the [employer] and nutrition series regularly. He is currently taking semaglutide to aid with blood sugar control and weight loss. He is down  5.3# since starting with our program. He continues to work toward increasing high fiber foods, moderation of saturated fat, and decreased sodium. Patient will benefit from participation in intensive cardiac rehab for nutrition, exercise, and lifestyle modification               Nutrition Goals Re-Evaluation:  Nutrition Goals Re-Evaluation     Row Name 01/13/23 1423 02/14/23 0841           Goals   Current Weight 224 lb 3.3 oz (101.7 kg) 217 lb 2.5 oz (98.5 kg)      Comment lipids WNL, A1c 8.4, Cr1.6, GFR 47 No new labs; most recent labs lipids WNL, A1c 8.4, Cr1.6, GFR 47      Expected Outcome Brendan [name] reports motivation to lose weight with goal weight of 205#. He is currently taking semaglutide to aid with blood sugar control and weight loss. He reports eating two meals daily and wife does the majority of cooking. Patient will benefit from participation in intensive cardiac rehab for nutrition, exercise, and lifestyle modification Goals in action. Brendan [name] continues to attend the [employer] and nutrition series regularly. He is currently taking semaglutide to aid with blood sugar control and weight loss. He is down 5.3# since starting with our program. He continues to work toward increasing high fiber foods, moderation of saturated fat, and decreased sodium. Patient will benefit from participation in intensive cardiac rehab for nutrition, exercise, and lifestyle  modification               Nutrition Goals Discharge (Final Nutrition Goals Re-Evaluation):  Nutrition Goals Re-Evaluation - 02/14/23 0841       Goals   Current Weight 217 lb 2.5 oz (98.5 kg)    Comment No new labs; most recent labs lipids WNL, A1c 8.4, Cr1.6, GFR 47    Expected Outcome Goals in action. Brendan [name] continues to attend the [employer] and nutrition series regularly. He is currently taking semaglutide to aid with blood sugar control and weight loss. He is down 5.3# since starting with our program. He continues to work toward increasing high fiber foods, moderation of saturated fat, and decreased sodium. Patient will benefit from participation in intensive cardiac rehab for nutrition, exercise, and lifestyle modification             Psychosocial: Target Goals: Acknowledge presence or absence of significant depression and/or stress, maximize coping skills, provide positive support system. Participant is able to verbalize types and ability to use techniques and skills needed for reducing stress and depression.  Initial Review & Psychosocial Screening:  Initial Psych Review & Screening - 01/07/23 1448       Initial Review   Current issues with Current Depression;History of Depression;Current Anxiety/Panic      Family Dynamics   Good Support System? Yes   Brendan [name] has his wife and his wifes family for support who live in the area   Comments Brendan [name] admits to suffering from severe depression. Brendan [name] takes an antidepressant and receives counselling every 6 months. Brendan [name]'s next counselling session will be on April 20th at the [facility]      Barriers   Psychosocial barriers to participate in program The patient should benefit from training in stress management and relaxation.      Screening Interventions   Interventions Encouraged to exercise;Provide feedback about the scores to participant    Expected Outcomes Long Term Goal: Stressors or current issues are controlled or  eliminated.;Short Term goal: Identification and review with participant of any  Quality of Life or Depression concerns found by scoring the questionnaire.;Long Term goal: The participant improves quality of Life and PHQ9 Scores as seen by post scores and/or verbalization of changes             Quality of Life Scores:  Quality of Life - 01/07/23 1315       Quality of Life   Select Quality of Life      Quality of Life Scores   Health/Function Pre 17.1 %    Socioeconomic Pre 23.06 %    Psych/Spiritual Pre 25.71 %    Family Pre 28.3 %    GLOBAL Pre 21.79 %            Scores of 19 and below usually indicate a poorer quality of life in these areas.  A difference of  2-3 points is a clinically meaningful difference.  A difference of 2-3 points in the total score of the Quality of Life Index has been associated with significant improvement in overall quality of life, self-image, physical symptoms, and general health in studies assessing change in quality of life.  PHQ-9: Review Flowsheet       01/07/2023  Depression screen PHQ 2/9  Decreased Interest 3  Down, Depressed, Hopeless 2  PHQ - 2 Score 5  Altered sleeping 3  Tired, decreased energy 3  Change in appetite 2  Feeling bad or failure about yourself  2  Trouble concentrating 3  Moving slowly or fidgety/restless 1  Suicidal thoughts 0  PHQ-9 Score 19  Difficult doing work/chores Somewhat difficult   Interpretation of Total Score  Total Score Depression Severity:  1-4 = Minimal depression, 5-9 = Mild depression, 10-14 = Moderate depression, 15-19 = Moderately severe depression, 20-27 = Severe depression   Psychosocial Evaluation and Intervention:   Psychosocial Re-Evaluation:  Psychosocial Re-Evaluation     Row Name 01/13/23 1440 02/06/23 1641 03/04/23 1807         Psychosocial Re-Evaluation   Current issues with Current Depression;Current Anxiety/Panic;History of Depression Current Depression;Current  Anxiety/Panic;History of Depression Current Depression;Current Anxiety/Panic;History of Depression     Comments Brendan [name] did not voice any increased concerns or stressors on her his first day of exercise at intensive cardiac rehab. Brendan [name] has not voiced any increased concerns or stressors  at intensive cardiac rehab. Brendan [name] has a follow up appointment with his therapist on 02/17/23. Brendan [name] says participating in intensive cardiac rehab has been helpful for him Brendan [name] has not voiced any increased concerns or stressors  at intensive cardiac rehab. Brendan [name] says participating in intensive cardiac rehab has been helpful for him     Expected Outcomes Brendan [name] will have controlled or decreased depression/ anxiety upon completion of intensive cardiac rehab Brendan [name] will have controlled or decreased depression/ anxiety upon completion of intensive cardiac rehab Brendan [name] will have controlled or decreased depression/ anxiety upon completion of intensive cardiac rehab     Interventions Encouraged to attend Cardiac Rehabilitation for the exercise;Stress management education;Encouraged to attend Pulmonary Rehabilitation for the exercise Encouraged to attend Cardiac Rehabilitation for the exercise;Stress management education;Encouraged to attend Pulmonary Rehabilitation for the exercise Encouraged to attend Cardiac Rehabilitation for the exercise;Stress management education;Encouraged to attend Pulmonary Rehabilitation for the exercise     Continue Psychosocial Services  Follow up required by staff Follow up required by staff No Follow up required              Psychosocial Discharge (Final Psychosocial Re-Evaluation):  Psychosocial Re-Evaluation - 03/04/23 1807  Psychosocial Re-Evaluation   Current issues with Current Depression;Current Anxiety/Panic;History of Depression    Comments Brendan [name] has not voiced any increased concerns or stressors  at intensive cardiac rehab. Brendan [name] says participating in intensive cardiac rehab has been helpful  for him    Expected Outcomes Brendan [name] will have controlled or decreased depression/ anxiety upon completion of intensive cardiac rehab    Interventions Encouraged to attend Cardiac Rehabilitation for the exercise;Stress management education;Encouraged to attend Pulmonary Rehabilitation for the exercise    Continue Psychosocial Services  No Follow up required             Vocational Rehabilitation: Provide vocational rehab assistance to qualifying candidates.   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 Participant will state understanding/return demonstration of topics presented.    Education     Row Name 01/13/23 1700     Education   Cardiac Education Topics Pritikin   Select Workshops     Workshops   Educator Exercise Physiologist   Select Psychosocial   Psychosocial Workshop Recognizing and Reducing Stress   Instruction Review Code 1- Verbalizes Understanding   Class Start Time 1148   Class Stop Time 1233   Class Time Calculation (min) 45 min    Row Name 01/15/23 1200     Education   Cardiac Education Topics Pritikin   Customer service manager   Weekly Topic Personalizing Your Pritikin Plate   Instruction Review Code 1- Verbalizes Understanding   Class Start Time 1140   Class Stop Time 1220   Class Time Calculation (min) 40 min    Row Name 01/17/23 1300     Education   Cardiac Education Topics Pritikin   Nurse, children's Exercise Physiologist   Select Psychosocial   Psychosocial Healthy Minds, Bodies, Hearts   Instruction Review Code  1- Verbalizes Understanding   Class Start Time 1151   Class Stop Time 1230   Class Time Calculation (min) 39 min    Row Name 01/20/23 1300     Education   Cardiac Education Topics Pritikin   Glass blower/designer Nutrition   Nutrition Workshop Label Reading   Instruction Review Code 1- Tax inspector   Class Start Time 1150   Class Stop Time 1233   Class Time Calculation (min) 43 min    Row Name 01/22/23 1300     Education   Cardiac Education Topics Pritikin   Customer service manager   Weekly Topic Tasty Appetizers and Snacks   Instruction Review Code 1- Verbalizes Understanding   Class Start Time 1150   Class Stop Time 1226   Class Time Calculation (min) 36 min    Row Name 01/24/23 1200     Education   Cardiac Education Topics Pritikin   Licensed conveyancer Nutrition   Instruction Review Code  1- Verbalizes Understanding   Class Start Time 1150   Class Stop Time 1226   Class Time Calculation (min) 36 min    Row Name 01/27/23 1300     Education   Cardiac Education Topics Pritikin   Geographical information systems officer Exercise   Exercise Workshop Exercise Basics: Building Your Action Plan   Instruction Review Code 1- Verbalizes Understanding   Class Start Time 1150   Class Stop Time 1235   Class Time Calculation (min) 45 min    Row Name 01/29/23 1500     Education   Cardiac Education Topics Pritikin   Orthoptist   Educator Dietitian   Weekly Topic Tasty Appetizers and Snacks   Instruction Review Code 1- Verbalizes Understanding   Class Start Time 1145   Class Stop Time 1223   Class Time Calculation (min) 38 min    Row Name 01/31/23 1300     Education   Cardiac Education Topics Pritikin   Careers information officer Nutrition   Nutrition Calorie Density   Instruction Review Code 1- Verbalizes Understanding   Class Start Time 1145   Class Stop Time 1225   Class Time Calculation (min) 40 min    Row Name 02/03/23 1400     Education   Cardiac Education Topics Pritikin   Nurse, children's   Educator Dietitian   Select Nutrition   Nutrition Nutrition Action Plan   Instruction Review Code 1- Verbalizes Understanding   Class Start Time 1145   Class Stop Time 1219   Class Time Calculation (min) 34 min    Row Name 02/05/23 1300     Education   Cardiac Education Topics Pritikin   Customer service manager   Weekly Topic Efficiency Cooking - Meals in a Snap   Instruction Review Code 1- Verbalizes Understanding   Class Start Time 1140   Class Stop Time 1220   Class Time Calculation (min) 40 min    Row Name 02/07/23 1400     Education   Cardiac Education Topics Pritikin   Psychologist, forensic Exercise Education   Exercise Education Move It!   Instruction Review Code 1- Verbalizes Understanding   Class Start Time 1150   Class Stop Time 1230   Class Time Calculation (min) 40 min    Row Name 02/10/23 1600     Education   Cardiac Education Topics Pritikin   Geographical information systems officer Psychosocial   Psychosocial Workshop Focused Goals, Sustainable Changes   Instruction Review Code 1- Verbalizes Understanding   Class Start Time 1150   Class Stop Time 1225   Class Time Calculation (min) 35 min    Row Name 02/12/23 1300     Education   Cardiac Education Topics Pritikin   Orthoptist   Educator Dietitian   Weekly Topic One-Pot Wonders   Instruction Review Code 1- Verbalizes Understanding   Class Start Time 1140   Class Stop Time 1212   Class Time Calculation (min) 32 min     Row Name 02/14/23 1400  Education   Cardiac Education Topics Pritikin   Hospital doctor Education   General Education Hypertension and Heart Disease   Instruction Review Code 1- Verbalizes Understanding   Class Start Time 1147   Class Stop Time 1220   Class Time Calculation (min) 33 min    Row Name 02/19/23 1300     Education   Cardiac Education Topics Pritikin   Customer service manager   Weekly Topic Comforting Weekend Breakfasts   Instruction Review Code 1- Verbalizes Understanding   Class Start Time 1135   Class Stop Time 1223   Class Time Calculation (min) 48 min    Row Name 02/24/23 1400     Education   Cardiac Education Topics Pritikin   Psychologist, sport and exercise     Core Videos   Educator Dietitian   Nutrition Facts on Fat   Instruction Review Code 1- Verbalizes Understanding   Class Start Time 1145   Class Stop Time 1225   Class Time Calculation (min) 40 min    Row Name 02/26/23 1200     Education   Cardiac Education Topics Pritikin   Customer service manager   Weekly Topic Fast Evening Meals   Instruction Review Code 1- Verbalizes Understanding   Class Start Time 1140   Class Stop Time 1215   Class Time Calculation (min) 35 min    Row Name 03/05/23 1400     Education   Cardiac Education Topics Pritikin   Customer service manager   Weekly Topic International Cuisine- Spotlight on the [employer] Zones   Instruction Review Code 1- Verbalizes Understanding   Class Start Time 1140   Class Stop Time 1212   Class Time Calculation (min) 32 min            Core Videos: Exercise    Move It!  Clinical staff conducted group or individual video education with verbal and written material and guidebook.  Patient learns the recommended Pritikin exercise program. Exercise  with the goal of living a long, healthy life. Some of the health benefits of exercise include controlled diabetes, healthier blood pressure levels, improved cholesterol levels, improved heart and lung capacity, improved sleep, and better body composition. Everyone should speak with their doctor before starting or changing an exercise routine.  Biomechanical Limitations Clinical staff conducted group or individual video education with verbal and written material and guidebook.  Patient learns how biomechanical limitations can impact exercise and how we can mitigate and possibly overcome limitations to have an impactful and balanced exercise routine.  Body Composition Clinical staff conducted group or individual video education with verbal and written material and guidebook.  Patient learns that body composition (ratio of muscle mass to fat mass) is a key component to assessing overall fitness, rather than body weight alone. Increased fat mass, especially visceral belly fat, can put [place] at increased risk for metabolic syndrome, type 2 diabetes, heart disease, and even death. It is recommended to combine diet and exercise (cardiovascular and resistance training) to improve your body composition. Seek guidance from your physician and exercise physiologist before implementing an exercise routine.  Exercise Action Plan Clinical staff conducted group or individual video education with verbal and written material and guidebook.  Patient learns the recommended  strategies to achieve and enjoy long-term exercise adherence, including variety, self-motivation, self-efficacy, and positive decision making. Benefits of exercise include fitness, good health, weight management, more energy, better sleep, less stress, and overall well-being.  Medical   Heart Disease Risk Reduction Clinical staff conducted group or individual video education with verbal and written material and guidebook.  Patient learns our heart is  our most vital organ as it circulates oxygen, nutrients, white blood cells, and hormones throughout the entire body, and carries waste away. Data supports a plant-based eating plan like the Pritikin Program for its effectiveness in slowing progression of and reversing heart disease. The video provides a number of recommendations to address heart disease.   Metabolic Syndrome and Belly Fat  Clinical staff conducted group or individual video education with verbal and written material and guidebook.  Patient learns what metabolic syndrome is, how it leads to heart disease, and how one can reverse it and keep it from coming back. You have metabolic syndrome if you have 3 of the following 5 criteria: abdominal obesity, high blood pressure, high triglycerides, low HDL cholesterol, and high blood sugar.  Hypertension and Heart Disease Clinical staff conducted group or individual video education with verbal and written material and guidebook.  Patient learns that high blood pressure, or hypertension, is very common in the [place]. Hypertension is largely due to excessive salt intake, but other important risk factors include being overweight, physical inactivity, drinking too much alcohol, smoking, and not eating enough potassium from fruits and vegetables. High blood pressure is a leading risk factor for heart attack, stroke, congestive heart failure, dementia, kidney failure, and premature death. Long-term effects of excessive salt intake include stiffening of the arteries and thickening of heart muscle and organ damage. Recommendations include ways to reduce hypertension and the risk of heart disease.  Diseases of Our Time - Focusing on Diabetes Clinical staff conducted group or individual video education with verbal and written material and guidebook.  Patient learns why the best way to stop diseases of our time is prevention, through food and other lifestyle changes. Medicine (such as prescription  pills and surgeries) is often only a Band-Aid on the problem, not a long-term solution. Most common diseases of our time include obesity, type 2 diabetes, hypertension, heart disease, and cancer. The Pritikin Program is recommended and has been proven to help reduce, reverse, and/or prevent the damaging effects of metabolic syndrome.  Nutrition   Overview of the Pritikin Eating Plan  Clinical staff conducted group or individual video education with verbal and written material and guidebook.  Patient learns about the Pritikin Eating Plan for disease risk reduction. The Pritikin Eating Plan emphasizes a wide variety of unrefined, minimally-processed carbohydrates, like fruits, vegetables, whole grains, and legumes. Go, Caution, and Stop food choices are explained. Plant-based and lean animal proteins are emphasized. Rationale provided for low sodium intake for blood pressure control, low added sugars for blood sugar stabilization, and low added fats and oils for coronary artery disease risk reduction and weight management.  Calorie Density  Clinical staff conducted group or individual video education with verbal and written material and guidebook.  Patient learns about calorie density and how it impacts the Pritikin Eating Plan. Knowing the characteristics of the food you choose will help you decide whether those foods will lead to weight gain or weight loss, and whether you want to consume more or less of them. Weight loss is usually a side effect of the Pritikin Eating Plan because of  its focus on low calorie-dense foods.  Label Reading  Clinical staff conducted group or individual video education with verbal and written material and guidebook.  Patient learns about the Pritikin recommended label reading guidelines and corresponding recommendations regarding calorie density, added sugars, sodium content, and whole grains.  Dining Out - Part 1  Clinical staff conducted group or individual video  education with verbal and written material and guidebook.  Patient learns that restaurant meals can be sabotaging because they can be so high in calories, fat, sodium, and/or sugar. Patient learns recommended strategies on how to positively address this and avoid unhealthy pitfalls.  Facts on Fats  Clinical staff conducted group or individual video education with verbal and written material and guidebook.  Patient learns that lifestyle modifications can be just as effective, if not more so, as many medications for lowering your risk of heart disease. A Pritikin lifestyle can help to reduce your risk of inflammation and atherosclerosis (cholesterol build-up, or plaque, in the artery walls). Lifestyle interventions such as dietary choices and physical activity address the cause of atherosclerosis. A review of the types of fats and their impact on blood cholesterol levels, along with dietary recommendations to reduce fat intake is also included.  Nutrition Action Plan  Clinical staff conducted group or individual video education with verbal and written material and guidebook.  Patient learns how to incorporate Pritikin recommendations into their lifestyle. Recommendations include planning and keeping personal health goals in mind as an important part of their success.  Healthy Mind-Set    Healthy Minds, Bodies, Hearts  Clinical staff conducted group or individual video education with verbal and written material and guidebook.  Patient learns how to identify when they are stressed. Video will discuss the impact of that stress, as well as the many benefits of stress management. Patient will also be introduced to stress management techniques. The way we think, act, and feel has an impact on our hearts.  How Our Thoughts Can Heal Our Hearts  Clinical staff conducted group or individual video education with verbal and written material and guidebook.  Patient learns that negative thoughts can cause  depression and anxiety. This can result in negative lifestyle behavior and serious health problems. Cognitive behavioral therapy is an effective method to help control our thoughts in order to change and improve our emotional outlook.  Additional Videos:  Exercise    Improving Performance  Clinical staff conducted group or individual video education with verbal and written material and guidebook.  Patient learns to use a non-linear approach by alternating intensity levels and lengths of time spent exercising to help burn more calories and lose more body fat. Cardiovascular exercise helps improve heart health, metabolism, hormonal balance, blood sugar control, and recovery from fatigue. Resistance training improves strength, endurance, balance, coordination, reaction time, metabolism, and muscle mass. Flexibility exercise improves circulation, posture, and balance. Seek guidance from your physician and exercise physiologist before implementing an exercise routine and learn your capabilities and proper form for all exercise.  Introduction to Yoga  Clinical staff conducted group or individual video education with verbal and written material and guidebook.  Patient learns about yoga, a discipline of the coming together of mind, breath, and body. The benefits of yoga include improved flexibility, improved range of motion, better posture and core strength, increased lung function, weight loss, and positive self-image. Yoga's heart health benefits include lowered blood pressure, healthier heart rate, decreased cholesterol and triglyceride levels, improved immune function, and reduced stress. Seek guidance from  your physician and exercise physiologist before implementing an exercise routine and learn your capabilities and proper form for all exercise.  Medical   Aging: Enhancing Your Quality of Life  Clinical staff conducted group or individual video education with verbal and written material and guidebook.   Patient learns key strategies and recommendations to stay in good physical health and enhance quality of life, such as prevention strategies, having an advocate, securing a Health Care Proxy and Power of Attorney, and keeping a list of medications and system for tracking them. It also discusses how to avoid risk for bone loss.  Biology of Weight Control  Clinical staff conducted group or individual video education with verbal and written material and guidebook.  Patient learns that weight gain occurs because we consume more calories than we burn (eating more, moving less). Even if your body weight is normal, you may have higher ratios of fat compared to muscle mass. Too much body fat puts you at increased risk for cardiovascular disease, heart attack, stroke, type 2 diabetes, and obesity-related cancers. In addition to exercise, following the Pritikin Eating Plan can help reduce your risk.  Decoding Lab Results  Clinical staff conducted group or individual video education with verbal and written material and guidebook.  Patient learns that lab test reflects one measurement whose values change over time and are influenced by many factors, including medication, stress, sleep, exercise, food, hydration, pre-existing medical conditions, and more. It is recommended to use the knowledge from this video to become more involved with your lab results and evaluate your numbers to speak with your doctor.   Diseases of Our Time - Overview  Clinical staff conducted group or individual video education with verbal and written material and guidebook.  Patient learns that according to the CDC, 50% to 70% of chronic diseases (such as obesity, type 2 diabetes, elevated lipids, hypertension, and heart disease) are avoidable through lifestyle improvements including healthier food choices, listening to satiety cues, and increased physical activity.  Sleep Disorders Clinical staff conducted group or individual video  education with verbal and written material and guidebook.  Patient learns how good quality and duration of sleep are important to overall health and well-being. Patient also learns about sleep disorders and how they impact health along with recommendations to address them, including discussing with a physician.  Nutrition  Dining Out - Part 2 Clinical staff conducted group or individual video education with verbal and written material and guidebook.  Patient learns how to plan ahead and communicate in order to maximize their dining experience in a healthy and nutritious manner. Included are recommended food choices based on the type of restaurant the patient is visiting.   Fueling a Banker conducted group or individual video education with verbal and written material and guidebook.  There is a strong connection between our food choices and our health. Diseases like obesity and type 2 diabetes are very prevalent and are in large-part due to lifestyle choices. The Pritikin Eating Plan provides plenty of food and hunger-curbing satisfaction. It is easy to follow, affordable, and helps reduce health risks.  Menu Workshop  Clinical staff conducted group or individual video education with verbal and written material and guidebook.  Patient learns that restaurant meals can sabotage health goals because they are often packed with calories, fat, sodium, and sugar. Recommendations include strategies to plan ahead and to communicate with the manager, chef, or server to help order a healthier meal.  Planning Your Eating Strategy  Clinical staff conducted group or individual video education with verbal and written material and guidebook.  Patient learns about the Pritikin Eating Plan and its benefit of reducing the risk of disease. The Pritikin Eating Plan does not focus on calories. Instead, it emphasizes high-quality, nutrient-rich foods. By knowing the characteristics of the foods, we  choose, we can determine their calorie density and make informed decisions.  Targeting Your Nutrition Priorities  Clinical staff conducted group or individual video education with verbal and written material and guidebook.  Patient learns that lifestyle habits have a tremendous impact on disease risk and progression. This video provides eating and physical activity recommendations based on your personal health goals, such as reducing LDL cholesterol, losing weight, preventing or controlling type 2 diabetes, and reducing high blood pressure.  Vitamins and Minerals  Clinical staff conducted group or individual video education with verbal and written material and guidebook.  Patient learns different ways to obtain key vitamins and minerals, including through a recommended healthy diet. It is important to discuss all supplements you take with your doctor.   Healthy Mind-Set    Smoking Cessation  Clinical staff conducted group or individual video education with verbal and written material and guidebook.  Patient learns that cigarette smoking and tobacco addiction pose a serious health risk which affects millions of people. Stopping smoking will significantly reduce the risk of heart disease, lung disease, and many forms of cancer. Recommended strategies for quitting are covered, including working with your doctor to develop a successful plan.  Culinary   Becoming a Set designer conducted group or individual video education with verbal and written material and guidebook.  Patient learns that cooking at home can be healthy, cost-effective, quick, and puts them in control. Keys to cooking healthy recipes will include looking at your recipe, assessing your equipment needs, planning ahead, making it simple, choosing cost-effective seasonal ingredients, and limiting the use of added fats, salts, and sugars.  Cooking - Breakfast and Snacks  Clinical staff conducted group or individual  video education with verbal and written material and guidebook.  Patient learns how important breakfast is to satiety and nutrition through the entire day. Recommendations include key foods to eat during breakfast to help stabilize blood sugar levels and to prevent overeating at meals later in the day. Planning ahead is also a key component.  Cooking - Educational psychologist conducted group or individual video education with verbal and written material and guidebook.  Patient learns eating strategies to improve overall health, including an approach to cook more at home. Recommendations include thinking of animal protein as a side on your plate rather than center stage and focusing instead on lower calorie dense options like vegetables, fruits, whole grains, and plant-based proteins, such as beans. Making sauces in large quantities to freeze for later and leaving the skin on your vegetables are also recommended to maximize your experience.  Cooking - Healthy Salads and Dressing Clinical staff conducted group or individual video education with verbal and written material and guidebook.  Patient learns that vegetables, fruits, whole grains, and legumes are the foundations of the Pritikin Eating Plan. Recommendations include how to incorporate each of these in flavorful and healthy salads, and how to create homemade salad dressings. Proper handling of ingredients is also covered. Cooking - Soups and [employer] - Soups and Desserts Clinical staff conducted group or individual video education with verbal and written material and guidebook.  Patient learns that Pritikin  soups and desserts make for easy, nutritious, and delicious snacks and meal components that are low in sodium, fat, sugar, and calorie density, while high in vitamins, minerals, and filling fiber. Recommendations include simple and healthy ideas for soups and desserts.   Overview     The Pritikin Solution Program  Overview Clinical staff conducted group or individual video education with verbal and written material and guidebook.  Patient learns that the results of the Pritikin Program have been documented in more than 100 articles published in peer-reviewed journals, and the benefits include reducing risk factors for (and, in some cases, even reversing) high cholesterol, high blood pressure, type 2 diabetes, obesity, and more! An overview of the three key pillars of the Pritikin Program will be covered: eating well, doing regular exercise, and having a healthy mind-set.  WORKSHOPS  Exercise: Exercise Basics: Building Your Action Plan Clinical staff led group instruction and group discussion with PowerPoint presentation and patient guidebook. To enhance the learning environment the use of posters, models and videos may be added. At the conclusion of this workshop, patients will comprehend the difference between physical activity and exercise, as well as the benefits of incorporating both, into their routine. Patients will understand the FITT (Frequency, Intensity, Time, and Type) principle and how to use it to build an exercise action plan. In addition, safety concerns and other considerations for exercise and cardiac rehab will be addressed by the presenter. The purpose of this lesson is to promote a comprehensive and effective weekly exercise routine in order to improve patients' overall level of fitness.   Managing Heart Disease: Your Path to a Healthier Heart Clinical staff led group instruction and group discussion with PowerPoint presentation and patient guidebook. To enhance the learning environment the use of posters, models and videos may be added.At the conclusion of this workshop, patients will understand the anatomy and physiology of the heart. Additionally, they will understand how Pritikin's three pillars impact the risk factors, the progression, and the management of heart disease.  The  purpose of this lesson is to provide a high-level overview of the heart, heart disease, and how the Pritikin lifestyle positively impacts risk factors.  Exercise Biomechanics Clinical staff led group instruction and group discussion with PowerPoint presentation and patient guidebook. To enhance the learning environment the use of posters, models and videos may be added. Patients will learn how the structural parts of their bodies function and how these functions impact their daily activities, movement, and exercise. Patients will learn how to promote a neutral spine, learn how to manage pain, and identify ways to improve their physical movement in order to promote healthy living. The purpose of this lesson is to expose patients to common physical limitations that impact physical activity. Participants will learn practical ways to adapt and manage aches and pains, and to minimize their effect on regular exercise. Patients will learn how to maintain good posture while sitting, walking, and lifting.  Balance Training and Fall Prevention  Clinical staff led group instruction and group discussion with PowerPoint presentation and patient guidebook. To enhance the learning environment the use of posters, models and videos may be added. At the conclusion of this workshop, patients will understand the importance of their sensorimotor skills (vision, proprioception, and the vestibular system) in maintaining their ability to balance as they age. Patients will apply a variety of balancing exercises that are appropriate for their current level of function. Patients will understand the common causes for poor balance, possible solutions to these  problems, and ways to modify their physical environment in order to minimize their fall risk. The purpose of this lesson is to teach patients about the importance of maintaining balance as they age and ways to minimize their risk of falling.  WORKSHOPS   Nutrition:   Fueling a Ship broker led group instruction and group discussion with PowerPoint presentation and patient guidebook. To enhance the learning environment the use of posters, models and videos may be added. Patients will review the foundational principles of the Pritikin Eating Plan and understand what constitutes a serving size in each of the food groups. Patients will also learn Pritikin-friendly foods that are better choices when away from home and review make-ahead meal and snack options. Calorie density will be reviewed and applied to three nutrition priorities: weight maintenance, weight loss, and weight gain. The purpose of this lesson is to reinforce (in a group setting) the key concepts around what patients are recommended to eat and how to apply these guidelines when away from home by planning and selecting Pritikin-friendly options. Patients will understand how calorie density may be adjusted for different weight management goals.  Mindful Eating  Clinical staff led group instruction and group discussion with PowerPoint presentation and patient guidebook. To enhance the learning environment the use of posters, models and videos may be added. Patients will briefly review the concepts of the Pritikin Eating Plan and the importance of low-calorie dense foods. The concept of mindful eating will be introduced as well as the importance of paying attention to internal hunger signals. Triggers for non-hunger eating and techniques for dealing with triggers will be explored. The purpose of this lesson is to provide patients with the opportunity to review the basic principles of the Pritikin Eating Plan, discuss the value of eating mindfully and how to measure internal cues of hunger and fullness using the Hunger Scale. Patients will also discuss reasons for non-hunger eating and learn strategies to use for controlling emotional eating.  Targeting Your Nutrition Priorities Clinical staff led  group instruction and group discussion with PowerPoint presentation and patient guidebook. To enhance the learning environment the use of posters, models and videos may be added. Patients will learn how to determine their genetic susceptibility to disease by reviewing their family history. Patients will gain insight into the importance of diet as part of an overall healthy lifestyle in mitigating the impact of genetics and other environmental insults. The purpose of this lesson is to provide patients with the opportunity to assess their personal nutrition priorities by looking at their family history, their own health history and current risk factors. Patients will also be able to discuss ways of prioritizing and modifying the Pritikin Eating Plan for their highest risk areas  Menu  Clinical staff led group instruction and group discussion with PowerPoint presentation and patient guidebook. To enhance the learning environment the use of posters, models and videos may be added. Using menus brought in from [employer], or printed from [employer], patients will apply the Pritikin dining out guidelines that were presented in the [employer] video. Patients will also be able to practice these guidelines in a variety of provided scenarios. The purpose of this lesson is to provide patients with the opportunity to practice hands-on learning of the Pritikin Dining Out guidelines with actual menus and practice scenarios.  Label Reading Clinical staff led group instruction and group discussion with PowerPoint presentation and patient guidebook. To enhance the learning environment the use of posters,  models and videos may be added. Patients will review and discuss the Pritikin label reading guidelines presented in Pritikin's Label Reading Educational series video. Using fool labels brought in from local grocery stores and markets, patients will apply the label reading guidelines and determine  if the packaged food meet the Pritikin guidelines. The purpose of this lesson is to provide patients with the opportunity to review, discuss, and practice hands-on learning of the Pritikin Label Reading guidelines with actual packaged food labels. Cooking School  Pritikin's [employer] are designed to teach patients ways to prepare quick, simple, and affordable recipes at home. The importance of nutrition's role in chronic disease risk reduction is reflected in its emphasis in the overall Pritikin program. By learning how to prepare essential core Pritikin Eating Plan recipes, patients will increase control over what they eat; be able to customize the flavor of foods without the use of added salt, sugar, or fat; and improve the quality of the food they consume. By learning a set of core recipes which are easily assembled, quickly prepared, and affordable, patients are more likely to prepare more healthy foods at home. These workshops focus on convenient breakfasts, simple entres, side dishes, and desserts which can be prepared with minimal effort and are consistent with nutrition recommendations for cardiovascular risk reduction. Cooking [employer] are taught by a Armed forces logistics/support/administrative officer (RD) who has been trained by the [employer]. The chef or RD has a clear understanding of the importance of minimizing - if not completely eliminating - added fat, sugar, and sodium in recipes. Throughout the series of Cooking School Workshop sessions, patients will learn about healthy ingredients and efficient methods of cooking to build confidence in their capability to prepare    Cooking School weekly topics:  Adding Flavor- Sodium-Free  Fast and Healthy Breakfasts  Powerhouse Plant-Based Proteins  Satisfying Salads and Dressings  Simple Sides and Sauces  International Cuisine-Spotlight on the [employer] Zones  Delicious Desserts  Savory Soups  [employer] - Meals in a  Astronomer Appetizers and Snacks  Comforting Weekend Breakfasts  One-Pot Wonders   Fast Evening Meals  Landscape architect Your Pritikin Plate  WORKSHOPS   Healthy Mindset (Psychosocial):  Focused Goals, Sustainable Changes Clinical staff led group instruction and group discussion with PowerPoint presentation and patient guidebook. To enhance the learning environment the use of posters, models and videos may be added. Patients will be able to apply effective goal setting strategies to establish at least one personal goal, and then take consistent, meaningful action toward that goal. They will learn to identify common barriers to achieving personal goals and develop strategies to overcome them. Patients will also gain an understanding of how our mind-set can impact our ability to achieve goals and the importance of cultivating a positive and growth-oriented mind-set. The purpose of this lesson is to provide patients with a deeper understanding of how to set and achieve personal goals, as well as the tools and strategies needed to overcome common obstacles which may arise along the way.  From Head to Heart: The Power of a Healthy Outlook  Clinical staff led group instruction and group discussion with PowerPoint presentation and patient guidebook. To enhance the learning environment the use of posters, models and videos may be added. Patients will be able to recognize and describe the impact of emotions and mood on physical health. They will discover the importance of self-care and explore self-care practices which may work for  them. Patients will also learn how to utilize the 4 C's to cultivate a healthier outlook and better manage stress and challenges. The purpose of this lesson is to demonstrate to patients how a healthy outlook is an essential part of maintaining good health, especially as they continue their cardiac rehab journey.  Healthy Sleep for a Healthy Heart Clinical staff  led group instruction and group discussion with PowerPoint presentation and patient guidebook. To enhance the learning environment the use of posters, models and videos may be added. At the conclusion of this workshop, patients will be able to demonstrate knowledge of the importance of sleep to overall health, well-being, and quality of life. They will understand the symptoms of, and treatments for, common sleep disorders. Patients will also be able to identify daytime and nighttime behaviors which impact sleep, and they will be able to apply these tools to help manage sleep-related challenges. The purpose of this lesson is to provide patients with a general overview of sleep and outline the importance of quality sleep. Patients will learn about a few of the most common sleep disorders. Patients will also be introduced to the concept of "sleep hygiene," and discover ways to self-manage certain sleeping problems through simple daily behavior changes. Finally, the workshop will motivate patients by clarifying the links between quality sleep and their goals of heart-healthy living.   Recognizing and Reducing Stress Clinical staff led group instruction and group discussion with PowerPoint presentation and patient guidebook. To enhance the learning environment the use of posters, models and videos may be added. At the conclusion of this workshop, patients will be able to understand the types of stress reactions, differentiate between acute and chronic stress, and recognize the impact that chronic stress has on their health. They will also be able to apply different coping mechanisms, such as reframing negative self-talk. Patients will have the opportunity to practice a variety of stress management techniques, such as deep abdominal breathing, progressive muscle relaxation, and/or guided imagery.  The purpose of this lesson is to educate patients on the role of stress in their lives and to provide healthy techniques  for coping with it.  Learning Barriers/Preferences:  Learning Barriers/Preferences - 01/07/23 1316       Learning Barriers/Preferences   Learning Barriers None    Learning Preferences Verbal Instruction;Video;Computer/Internet;Written Material;Audio;Group Instruction;Individual Instruction;Pictoral;Skilled Demonstration             Education Topics:  Knowledge Questionnaire Score:  Knowledge Questionnaire Score - 01/07/23 1318       Knowledge Questionnaire Score   Pre Score 18/24             Core Components/Risk Factors/Patient Goals at Admission:  Personal Goals and Risk Factors at Admission - 01/07/23 1316       Core Components/Risk Factors/Patient Goals on Admission    Weight Management Yes;Obesity;Weight Loss    Intervention Weight Management: Develop a combined nutrition and exercise program designed to reach desired caloric intake, while maintaining appropriate intake of nutrient and fiber, sodium and fats, and appropriate energy expenditure required for the weight goal.;Weight Management: Provide education and appropriate resources to help participant work on and attain dietary goals.;Weight Management/Obesity: Establish reasonable short term and long term weight goals.    Admit Weight 222 lb 7.1 oz (100.9 kg)    Goal Weight: Long Term 199 lb (90.3 kg)   Pt goal   Expected Outcomes Short Term: Continue to assess and modify interventions until short term weight is achieved;Long Term: Adherence to nutrition  and physical activity/exercise program aimed toward attainment of established weight goal;Weight Maintenance: Understanding of the daily nutrition guidelines, which includes 25-35% calories from fat, 7% or less cal from saturated fats, less than 200mg  cholesterol, less than 1.5gm of sodium, & 5 or more servings of fruits and vegetables daily;Weight Loss: Understanding of general recommendations for a balanced deficit meal plan, which promotes 1-2 lb weight loss per  week and includes a negative energy balance of [phone] kcal/d;Understanding recommendations for meals to include 15-35% energy as protein, 25-35% energy from fat, 35-60% energy from carbohydrates, less than 200mg  of dietary cholesterol, 20-35 gm of total fiber daily;Understanding of distribution of calorie intake throughout the day with the consumption of 4-5 meals/snacks    Diabetes Yes    Intervention Provide education about signs/symptoms and action to take for hypo/hyperglycemia.;Provide education about proper nutrition, including hydration, and aerobic/resistive exercise prescription along with prescribed medications to achieve blood glucose in normal ranges: Fasting glucose 65-99 mg/dL    Expected Outcomes Short Term: Participant verbalizes understanding of the signs/symptoms and immediate care of hyper/hypoglycemia, proper foot care and importance of medication, aerobic/resistive exercise and nutrition plan for blood glucose control.;Long Term: Attainment of HbA1C < 7%.    Hypertension Yes    Intervention Provide education on lifestyle modifcations including regular physical activity/exercise, weight management, moderate sodium restriction and increased consumption of fresh fruit, vegetables, and low fat dairy, alcohol moderation, and smoking cessation.;Monitor prescription use compliance.    Expected Outcomes Short Term: Continued assessment and intervention until BP is < 140/60mm HG in hypertensive participants. < 130/79mm HG in hypertensive participants with diabetes, heart failure or chronic kidney disease.;Long Term: Maintenance of blood pressure at goal levels.    Lipids Yes    Intervention Provide education and support for participant on nutrition & aerobic/resistive exercise along with prescribed medications to achieve LDL 70mg , HDL >40mg .    Expected Outcomes Short Term: Participant states understanding of desired cholesterol values and is compliant with medications prescribed. Participant  is following exercise prescription and nutrition guidelines.;Long Term: Cholesterol controlled with medications as prescribed, with individualized exercise RX and with personalized nutrition plan. Value goals: LDL < 70mg , HDL > 40 mg.    Stress Yes    Intervention Offer individual and/or small group education and counseling on adjustment to heart disease, stress management and health-related lifestyle change. Teach and support self-help strategies.;Refer participants experiencing significant psychosocial distress to appropriate mental health specialists for further evaluation and treatment. When possible, include family members and significant others in education/counseling sessions.    Expected Outcomes Short Term: Participant demonstrates changes in health-related behavior, relaxation and other stress management skills, ability to obtain effective social support, and compliance with psychotropic medications if prescribed.;Long Term: Emotional wellbeing is indicated by absence of clinically significant psychosocial distress or social isolation.             Core Components/Risk Factors/Patient Goals Review:   Goals and Risk Factor Review     Row Name 01/13/23 1443 02/06/23 1644 03/04/23 1808         Core Components/Risk Factors/Patient Goals Review   Personal Goals Review Weight Management/Obesity;Lipids;Diabetes;Hypertension;Stress Weight Management/Obesity;Lipids;Diabetes;Hypertension;Stress Weight Management/Obesity;Lipids;Diabetes;Hypertension;Stress     Review Brendan [name] started intensive cardiac rehab on 01/13/23. Brendan [name] did well with exercise, vital signs and CBG's were stable. Emotional support and encouragment  provided as Brendan [name] said he was nervous about exercise. Brendan [name] is doing well with exercise at  intensive cardiac rehab on 01/13/23., vital signs and CBG's have been stable. Brendan [name] is enjoying  particioating in the program. Brendan [name] is doing well with exercise at  intensive cardiac rehab, Vital signs  and CBG's have been stable. Brendan [name] is enjoying particioating in the program. Brendan [name] has lost 1.4 kg since starting the program     Expected Outcomes Brendan [name] will continue to participate in intensive cardiac rehab for exercise, nutrition and lifestyle modifications Brendan [name] will continue to participate in intensive cardiac rehab for exercise, nutrition and lifestyle modifications Brendan [name] will continue to participate in intensive cardiac rehab for exercise, nutrition and lifestyle modifications              Core Components/Risk Factors/Patient Goals at Discharge (Final Review):   Goals and Risk Factor Review - 03/04/23 1808       Core Components/Risk Factors/Patient Goals Review   Personal Goals Review Weight Management/Obesity;Lipids;Diabetes;Hypertension;Stress    Review Brendan [name] is doing well with exercise at  intensive cardiac rehab, Vital signs and CBG's have been stable. Brendan [name] is enjoying particioating in the program. Brendan [name] has lost 1.4 kg since starting the program    Expected Outcomes Brendan [name] will continue to participate in intensive cardiac rehab for exercise, nutrition and lifestyle modifications             ITP Comments:  ITP Comments     Row Name 01/07/23 1308 01/13/23 1439 02/06/23 1640 03/04/23 1806     ITP Comments Armanda Magic, MD; Medical Director. Introduction to the Pritikin Education Program/Intensive Cardiac Rehab. Initial orientation packet reviewed with the patient. 30 Day ITP Review. Brendan [name] started intensive cardiac rehab on 01/13/23 and did well with exercise. 30 Day ITP Review. Brendan [name] has good attendance and participation in intensive cardiac rehab 30 Day ITP Review. Brendan [name] continues  good a participation in intensive cardiac rehab             Comments: See ITP comments.

## 2023-03-05 ENCOUNTER — Encounter (HOSPITAL_COMMUNITY)
Admission: RE | Admit: 2023-03-05 | Discharge: 2023-03-05 | Disposition: A | Discharge status: Home / Self Care | Payer: No Typology Code available for payment source | Source: Ambulatory Visit | Attending: Cardiology | Admitting: Cardiology

## 2023-03-05 DIAGNOSIS — I2089 Other forms of angina pectoris: Secondary | ICD-10-CM

## 2023-03-07 ENCOUNTER — Encounter (HOSPITAL_COMMUNITY)
Admission: RE | Admit: 2023-03-07 | Discharge: 2023-03-07 | Disposition: A | Discharge status: Home / Self Care | Payer: No Typology Code available for payment source | Source: Ambulatory Visit | Attending: Cardiology | Admitting: Cardiology

## 2023-03-07 DIAGNOSIS — I2089 Other forms of angina pectoris: Secondary | ICD-10-CM | POA: Diagnosis not present

## 2023-03-10 ENCOUNTER — Encounter (HOSPITAL_COMMUNITY)
Admission: RE | Admit: 2023-03-10 | Discharge: 2023-03-10 | Disposition: A | Discharge status: Home / Self Care | Payer: No Typology Code available for payment source | Source: Ambulatory Visit | Attending: Cardiology | Admitting: Cardiology

## 2023-03-10 DIAGNOSIS — I2089 Other forms of angina pectoris: Secondary | ICD-10-CM | POA: Diagnosis not present

## 2023-03-12 ENCOUNTER — Encounter (HOSPITAL_COMMUNITY)
Admission: RE | Admit: 2023-03-12 | Discharge: 2023-03-12 | Disposition: A | Discharge status: Home / Self Care | Payer: No Typology Code available for payment source | Source: Ambulatory Visit | Attending: Cardiology | Admitting: Cardiology

## 2023-03-12 DIAGNOSIS — I2089 Other forms of angina pectoris: Secondary | ICD-10-CM | POA: Diagnosis not present

## 2023-03-14 ENCOUNTER — Encounter (HOSPITAL_COMMUNITY)
Admission: RE | Admit: 2023-03-14 | Discharge: 2023-03-14 | Disposition: A | Discharge status: Home / Self Care | Payer: No Typology Code available for payment source | Source: Ambulatory Visit | Attending: Cardiology | Admitting: Cardiology

## 2023-03-14 DIAGNOSIS — E1122 Type 2 diabetes mellitus with diabetic chronic kidney disease: Secondary | ICD-10-CM | POA: Diagnosis not present

## 2023-03-14 DIAGNOSIS — I2089 Other forms of angina pectoris: Secondary | ICD-10-CM | POA: Diagnosis not present

## 2023-03-14 DIAGNOSIS — N189 Chronic kidney disease, unspecified: Secondary | ICD-10-CM | POA: Diagnosis not present

## 2023-03-17 ENCOUNTER — Encounter (HOSPITAL_COMMUNITY)
Admission: RE | Admit: 2023-03-17 | Discharge: 2023-03-17 | Disposition: A | Discharge status: Home / Self Care | Payer: No Typology Code available for payment source | Source: Ambulatory Visit | Attending: Cardiology | Admitting: Cardiology

## 2023-03-17 DIAGNOSIS — I2089 Other forms of angina pectoris: Secondary | ICD-10-CM

## 2023-03-18 ENCOUNTER — Telehealth (HOSPITAL_COMMUNITY): Payer: Self-pay

## 2023-03-18 NOTE — Telephone Encounter (Signed)
-----   Message from Peter M Swaziland, MD sent at 03/18/2023 10:17 AM EDT ----- Regarding: RE: Increase in THRR for Brendan [name] Agree completely  Peter Swaziland MD, [facility]   ----- Message ----- From: Lorin Picket Sent: 03/18/2023   9:08 AM EDT To: Peter M Swaziland, MD Subject: Increase in [facility] for Brendan [name]       Good morning Dr. Swaziland,  I am requesting an increase in [facility] as he exceeds his current THR of 126. His current range is 65-126. I would like to increase his to 90% of APM (142), if that is not acceptable, 85% would be (134).  Heart rates with exercise have ranged from : 112-143, and BP 138-170/62-80.  If you need any additional information please let me know. Thank you for you consideration of this request.  Best Regards,  Lorin Picket MS, ACSM-CEP, CCRP

## 2023-03-19 ENCOUNTER — Encounter (HOSPITAL_COMMUNITY)
Admission: RE | Admit: 2023-03-19 | Discharge: 2023-03-19 | Disposition: A | Discharge status: Home / Self Care | Payer: No Typology Code available for payment source | Source: Ambulatory Visit | Attending: Cardiology | Admitting: Cardiology

## 2023-03-19 DIAGNOSIS — I2089 Other forms of angina pectoris: Secondary | ICD-10-CM

## 2023-03-21 ENCOUNTER — Encounter (HOSPITAL_COMMUNITY)
Admission: RE | Admit: 2023-03-21 | Discharge: 2023-03-21 | Disposition: A | Discharge status: Home / Self Care | Payer: No Typology Code available for payment source | Source: Ambulatory Visit | Attending: Cardiology | Admitting: Cardiology

## 2023-03-21 DIAGNOSIS — I2089 Other forms of angina pectoris: Secondary | ICD-10-CM

## 2023-03-26 ENCOUNTER — Encounter (HOSPITAL_COMMUNITY)
Admission: RE | Admit: 2023-03-26 | Discharge: 2023-03-26 | Disposition: A | Discharge status: Home / Self Care | Payer: No Typology Code available for payment source | Source: Ambulatory Visit | Attending: Cardiology | Admitting: Cardiology

## 2023-03-26 DIAGNOSIS — I2089 Other forms of angina pectoris: Secondary | ICD-10-CM

## 2023-03-28 ENCOUNTER — Encounter (HOSPITAL_COMMUNITY): Payer: No Typology Code available for payment source

## 2023-03-31 ENCOUNTER — Encounter (HOSPITAL_COMMUNITY)
Admission: RE | Admit: 2023-03-31 | Discharge: 2023-03-31 | Disposition: A | Discharge status: Home / Self Care | Payer: No Typology Code available for payment source | Source: Ambulatory Visit | Attending: Cardiology | Admitting: Cardiology

## 2023-03-31 DIAGNOSIS — I2089 Other forms of angina pectoris: Secondary | ICD-10-CM | POA: Diagnosis not present

## 2023-04-01 NOTE — Progress Notes (Signed)
Cardiac Individual Treatment Plan  Patient Details  Name: Brendan [name] MRN: [id number] Date of Birth: 07-27-1961 Referring Provider:   Flowsheet Row INTENSIVE CARDIAC REHAB ORIENT from 01/07/2023 in [facility]  Referring Provider Peter Swaz[place], MD       Initial Encounter Date:  Flowsheet Row INTENSIVE CARDIAC REHAB ORIENT from 01/07/2023 in [facility] for Heart, Vascular, & Lung Health  Date 01/07/23       Visit Diagnosis: Chronic stable angina  Patient's Home Medications on Admission:  Current Outpatient Medications:    albuterol (VENTOLIN HFA) 108 (90 Base) MCG/ACT inhaler, Inhale 1-2 puffs into the lungs every 6 (six) hours as needed for shortness of breath or wheezing., Disp: , Rfl:    amoxicillin (AMOXIL) 500 MG capsule, TAKE FOUR CAPSULES BY MOUTH ONCE 1 HOUR PRIOR TO THE DENTAL TREATMENT, Disp: 4 capsule, Rfl: 3   aspirin EC 81 MG tablet, Take 81 mg by mouth at bedtime. Swallow whole., Disp: , Rfl:    atorvastatin (LIPITOR) 40 MG tablet, Take 20 mg by mouth at bedtime., Disp: , Rfl:    benzonatate (TESSALON) 200 MG capsule, Take 1 capsule (200 mg total) by mouth 3 (three) times daily as needed., Disp: 60 capsule, Rfl: 1   clobetasol cream (TEMOVATE) 0.05 %, SMARTSIG:1 Topical Daily, Disp: , Rfl:    diazepam (VALIUM) 5 MG tablet, Take 5 mg by mouth daily as needed for anxiety., Disp: , Rfl:    EPINEPHrine 0.3 mg/0.3 mL IJ SOAJ injection, Inject 0.3 mg into the muscle as needed for anaphylaxis., Disp: 1 each, Rfl: 0   escitalopram (LEXAPRO) 10 MG tablet, Take 10 mg by mouth at bedtime., Disp: , Rfl:    glimepiride (AMARYL) 4 MG tablet, Take 4 mg by mouth every evening., Disp: , Rfl:    mometasone-formoterol (DULERA) 100-5 MCG/ACT AERO, Inhale 2 puffs into the lungs in the morning and at bedtime., Disp: 26.4 g, Rfl: 11   Semaglutide,0.25 or 0.5MG /DOS, 2 MG/3ML SOPN, Inject 0.25 mg into the  skin once a week., Disp: , Rfl:    traZODone (DESYREL) 100 MG tablet, Take 100 mg by mouth at bedtime., Disp: , Rfl:    zolpidem (AMBIEN) 10 MG tablet, Take 10 mg by mouth at bedtime., Disp: , Rfl:   Past Medical History: Past Medical History:  Diagnosis Date   Coronary artery disease    Diabetes mellitus without complication (HCC)    Hyperlipidemia    Hypertension    Kidney disease    Sarcoidosis    Stroke (HCC)     Tobacco Use: Social History   Tobacco Use  Smoking Status Never  Smokeless Tobacco Never    Labs: Review Flowsheet       Latest Ref Rng & Units 04/03/2008 08/01/2022  Labs for ITP Cardiac and Pulmonary Rehab  PH, Arterial 7.35 - 7.45 - 7.398   PCO2 arterial 32 - 48 mmHg - 37.1   Bicarbonate 20.0 - 28.0 mmol/L - 23.3  24.6  23.1  22.8   TCO2 22 - 32 mmol/L 25  25  26  24  24    Acid-base deficit 0.0 - 2.0 mmol/L - 2.0  1.0  2.0  2.0   O2 Saturation % - 79  75  81  98     Capillary Blood Glucose: Lab Results  Component Value Date   GLUCAP 173 (H) 01/27/2023   GLUCAP 222 (H) 01/17/2023   GLUCAP 173 (  H) 01/15/2023   GLUCAP 248 (H) 01/15/2023   GLUCAP 132 (H) 01/13/2023     Exercise Target Goals: Exercise Program Goal: Individual exercise prescription set using results from initial 6 min walk test and THRR while considering  patient's activity barriers and safety.   Exercise Prescription Goal: Initial exercise prescription builds to 30-45 minutes a day of aerobic activity, 2-3 days per week.  Home exercise guidelines will be given to patient during program as part of exercise prescription that the participant will acknowledge.  Activity Barriers & Risk Stratification:  Activity Barriers & Cardiac Risk Stratification - 01/07/23 1311       Activity Barriers & Cardiac Risk Stratification   Activity Barriers Left Knee Replacement;Right Knee Replacement;Deconditioning;Back Problems;Balance Concerns;Shortness of Breath;Joint Problems    Cardiac Risk  Stratification High             6 Minute Walk:  6 Minute Walk     Row Name 01/07/23 1043 03/31/23 1308       6 Minute Walk   Phase Initial Discharge    Distance 1410 feet 1860 feet    Distance % Change -- 31.91 %    Distance Feet Change -- 450 ft    Walk Time 6 minutes 6 minutes    # of Rest Breaks 0 0    MPH 2.7 3.52    METS 3.63 4.43    RPE 10 10    Perceived Dyspnea  0 0    VO2 Peak 12.7 15.5    Symptoms No No    Resting HR 81 bpm 90 bpm    Resting BP 108/70 114/58    Resting Oxygen Saturation  98 % --    Exercise Oxygen Saturation  during 6 min walk 98 % --    Max Ex. HR 119 bpm 123 bpm    Max Ex. BP 138/60 130/64    2 Minute Post BP 112/62 --      Interval HR   1 Minute HR 92 --    2 Minute HR 107 --    3 Minute HR 111 --    4 Minute HR 113 --    5 Minute HR 116 --    6 Minute HR 117 --    Interval Heart Rate? Yes --      Interval Oxygen   Interval Oxygen? Yes --    Baseline Oxygen Saturation % 98 % --    1 Minute Oxygen Saturation % 94 % --    1 Minute Liters of Oxygen 0 L --    2 Minute Oxygen Saturation % 94 % --    2 Minute Liters of Oxygen 0 L --    3 Minute Oxygen Saturation % 94 % --    3 Minute Liters of Oxygen 0 L --    4 Minute Oxygen Saturation % 93 % --    4 Minute Liters of Oxygen 0 L --    5 Minute Oxygen Saturation % 96 % --    5 Minute Liters of Oxygen 0 L --    6 Minute Oxygen Saturation % 98 % --    6 Minute Liters of Oxygen 0 L --    2 Minute Post Oxygen Saturation % 100 % --             Oxygen Initial Assessment:   Oxygen Re-Evaluation:   Oxygen Discharge (Final Oxygen Re-Evaluation):   Initial Exercise Prescription:  Initial Exercise Prescription - 01/07/23 1300  Date of Initial Exercise RX and Referring Provider   Date 01/07/23    Referring Provider Peter [place], MD    Expected Discharge Date 03/21/23      Recumbant Bike   Level 2    RPM 60    Watts 52    Minutes 15    METs 3.6      Arm  Ergometer   Level 1.5    Watts 64    RPM 60    Minutes 15    METs 3.6      Prescription Details   Frequency (times per week) 3    Duration Progress to 30 minutes of continuous aerobic without signs/symptoms of physical distress      Intensity   THRR 40-80% of Max Heartrate 63-126    Ratings of Perceived Exertion 11-13    Perceived Dyspnea 0-4      Progression   Progression Continue progressive overload as per policy without signs/symptoms or physical distress.      Resistance Training   Training Prescription Yes    Weight 4 lbs    Reps 10-15             Perform Capillary Blood Glucose checks as needed.  Exercise Prescription Changes:   Exercise Prescription Changes     Row Name 01/13/23 1600 01/31/23 1600 02/10/23 1511 02/24/23 1433 03/10/23 1400     Response to Exercise   Blood Pressure (Admit) 110/70 108/58 110/58 130/72 122/62   Blood Pressure (Exercise) 138/72 164/64 142/70 170/78 162/68   Blood Pressure (Exit) 104/70 102/58 112/70 98/44 120/60   Heart Rate (Admit) 84 bpm 83 bpm 94 bpm 94 bpm 94 bpm   Heart Rate (Exercise) 126 bpm 136 bpm 127 bpm 124 bpm 137 bpm   Heart Rate (Exit) 93 bpm 92 bpm 103 bpm 93 bpm 103 bpm   Rating of Perceived Exertion (Exercise) 11 11 11 11 12    Symptoms None None None None None   Comments Pt's first day in the CRP2 program Reviewed METs Reviewed METs and goals Reviewed home exercise Rx Reviewed METs and goals   Duration Continue with 30 min of aerobic exercise without signs/symptoms of physical distress. Continue with 30 min of aerobic exercise without signs/symptoms of physical distress. Continue with 30 min of aerobic exercise without signs/symptoms of physical distress. Continue with 30 min of aerobic exercise without signs/symptoms of physical distress. Continue with 30 min of aerobic exercise without signs/symptoms of physical distress.   Intensity THRR unchanged THRR unchanged THRR unchanged THRR unchanged THRR unchanged      Progression   Progression Continue to progress workloads to maintain intensity without signs/symptoms of physical distress. Continue to progress workloads to maintain intensity without signs/symptoms of physical distress. Continue to progress workloads to maintain intensity without signs/symptoms of physical distress. Continue to progress workloads to maintain intensity without signs/symptoms of physical distress. Continue to progress workloads to maintain intensity without signs/symptoms of physical distress.   Average METs 2.35 2.4 2.4 2.4 2.5     Resistance Training   Training Prescription Yes Yes Yes Yes Yes   Weight 4 lbs 4 lbs 4 lbs 4 lbs 4 lbs   Reps 10-15 10-15 10-15 10-15 10-15   Time 10 Minutes 10 Minutes 10 Minutes 10 Minutes 10 Minutes     Interval Training   Interval Training No No No No No     Recumbant Bike   Level 2 2.5 2.5 3 3    RPM -- 61 -- 53  53   Watts -- 16 -- 33 46   Minutes 15 15 15 15 15    METs 2.3 2.3 2.1 2.2 2.6     Arm Ergometer   Level 2.5 2.5 2.5 3 4    Watts 14 22 19  -- 21   RPM 58 63 57 -- 54   Minutes 15 15 15 15 15    METs 2.4 2.5 2.4 2.5 2.4     Home Exercise Plan   Plans to continue exercise at -- -- -- Home (comment) Home (comment)   Frequency -- -- -- Add 2 additional days to program exercise sessions. Add 2 additional days to program exercise sessions.   Initial Home Exercises Provided -- -- -- 02/24/23 02/24/23            Exercise Comments:   Exercise Comments     Row Name 01/13/23 1637 01/31/23 1406 02/10/23 1517 02/24/23 1436 03/10/23 1439   Exercise Comments Pt's first day in the CRP2 program. Pt had no complaints with todays session. Increased workload on arm ergometer due to low RPE. Reviewed METs. Pt is progressing in the CRP2 program. Reviewed EMTs and goals. Slow progress to increase METs. Encouraged workload increases next session. Reviewed home exercise Rx with patient today. Pt palns to walk or use his treadmill. Encouraged  30-45 minutes 5x/week.  Pt verbalized understnading of the home exercise Rx and was given a copy. Reviewed METs and goals. Pt making slow progress. Pt will complete program on 04/04/23.            Exercise Goals and Review:   Exercise Goals     Row Name 01/07/23 1314             Exercise Goals   Increase Physical Activity Yes       Intervention Provide advice, education, support and counseling about physical activity/exercise needs.;Develop an individualized exercise prescription for aerobic and resistive training based on initial evaluation findings, risk stratification, comorbidities and participant's personal goals.       Expected Outcomes Short Term: Attend rehab on a regular basis to increase amount of physical activity.;Long Term: Add in home exercise to make exercise part of routine and to increase amount of physical activity.;Long Term: Exercising regularly at least 3-5 days a week.       Increase Strength and Stamina Yes       Intervention Provide advice, education, support and counseling about physical activity/exercise needs.;Develop an individualized exercise prescription for aerobic and resistive training based on initial evaluation findings, risk stratification, comorbidities and participant's personal goals.       Expected Outcomes Short Term: Increase workloads from initial exercise prescription for resistance, speed, and METs.;Short Term: Perform resistance training exercises routinely during rehab and add in resistance training at home;Long Term: Improve cardiorespiratory fitness, muscular endurance and strength as measured by increased METs and functional capacity ( )       Able to understand and use rate of perceived exertion (RPE) scale Yes       Intervention Provide education and explanation on how to use RPE scale       Expected Outcomes Short Term: Able to use RPE daily in rehab to express subjective intensity level;Long Term:  Able to use RPE to guide intensity  level when exercising independently       Knowledge and understanding of Target Heart Rate Range (THRR) Yes       Intervention Provide education and explanation of THRR including how the numbers were predicted and where  they are located for reference       Expected Outcomes Short Term: Able to state/look up THRR;Short Term: Able to use daily as guideline for intensity in rehab;Long Term: Able to use THRR to govern intensity when exercising independently       Understanding of Exercise Prescription Yes       Intervention Provide education, explanation, and written materials on patient's individual exercise prescription       Expected Outcomes Short Term: Able to explain program exercise prescription;Long Term: Able to explain home exercise prescription to exercise independently                Exercise Goals Re-Evaluation :  Exercise Goals Re-Evaluation     Row Name 01/13/23 1636 02/10/23 1513 03/10/23 1433         Exercise Goal Re-Evaluation   Exercise Goals Review Increase Physical Activity;Understanding of Exercise Prescription;Increase Strength and Stamina;Knowledge and understanding of Target Heart Rate Range (THRR);Able to understand and use rate of perceived exertion (RPE) scale Increase Physical Activity;Understanding of Exercise Prescription;Increase Strength and Stamina;Knowledge and understanding of Target Heart Rate Range (THRR);Able to understand and use rate of perceived exertion (RPE) scale Increase Physical Activity;Understanding of Exercise Prescription;Increase Strength and Stamina;Knowledge and understanding of Target Heart Rate Range (THRR);Able to understand and use rate of perceived exertion (RPE) scale     Comments Pt's first day in the cRP2 program. Pt understands the exercise Rx, THRR and RPE scale. Reviewed METs and goals. Pt voices he feels that strength and stamina are improving. Pt has not achieved any significant weight loss. Reviewed METs and goals. Pt voices he  feels that strength and stamina have improved.Pt has lost 2kg since starting the program.     Expected Outcomes Will continue to monitor the patient and progress exercise workloads as tolerated. Will continue to monitor the patient and progress exercise workloads as tolerated. Will continue to monitor the patient and progress exercise workloads as tolerated.              Discharge Exercise Prescription (Final Exercise Prescription Changes):  Exercise Prescription Changes - 03/10/23 1400       Response to Exercise   Blood Pressure (Admit) 122/62    Blood Pressure (Exercise) 162/68    Blood Pressure (Exit) 120/60    Heart Rate (Admit) 94 bpm    Heart Rate (Exercise) 137 bpm    Heart Rate (Exit) 103 bpm    Rating of Perceived Exertion (Exercise) 12    Symptoms None    Comments Reviewed METs and goals    Duration Continue with 30 min of aerobic exercise without signs/symptoms of physical distress.    Intensity THRR unchanged      Progression   Progression Continue to progress workloads to maintain intensity without signs/symptoms of physical distress.    Average METs 2.5      Resistance Training   Training Prescription Yes    Weight 4 lbs    Reps 10-15    Time 10 Minutes      Interval Training   Interval Training No      Recumbant Bike   Level 3    RPM 53    Watts 46    Minutes 15    METs 2.6      Arm Ergometer   Level 4    Watts 21    RPM 54    Minutes 15    METs 2.4      Home Exercise Plan  Plans to continue exercise at Home (comment)    Frequency Add 2 additional days to program exercise sessions.    Initial Home Exercises Provided 02/24/23             Nutrition:  Target Goals: Understanding of nutrition guidelines, daily intake of sodium 1500mg , cholesterol 200mg , calories 30% from fat and 7% or less from saturated fats, daily to have 5 or more servings of fruits and vegetables.  Biometrics:  Pre Biometrics - 01/07/23 0945       Pre Biometrics    Waist Circumference 41.75 inches    Hip Circumference 41.5 inches    Waist to Hip Ratio 1.01 %    Triceps Skinfold 18 mm    % Body Fat 29.7 %    Grip Strength 54 kg    Flexibility 11.75 in    Single Leg Stand 13.43 seconds              Nutrition Therapy Plan and Nutrition Goals:  Nutrition Therapy & Goals - 03/14/23 1337       Nutrition Therapy   Diet Heart Healthy/Carbohydrate Consistent    Drug/Food Interactions Statins/Certain Fruits      Personal Nutrition Goals   Nutrition Goal Patient to improve diet quality by using the plate method as a guide for meal planning to include lean protein/plant protein, fruits, vegetables, whole grains, nonfat dairy as part of a well-balanced diet.    Personal Goal #2 Patient to identify strategies for reducing cardiovascular risk by attending the [employer] education and nutrition series weekly.    Personal Goal #3 Patient to identify strategies for improving blood sugar control with goals <7%    Comments Goals in action. Brendan [name] continues to attend the [employer] and nutrition series regularly. He is currently taking semaglutide to aid with blood sugar control and weight loss. He is down 6.2# since starting with our program. He continues to work toward increasing high fiber foods, moderation of saturated fat, and decreased sodium. He does eat out often but is trying to work on cooking more at home. Patient will benefit from participation in intensive cardiac rehab for nutrition, exercise, and lifestyle modification      Intervention Plan   Intervention Prescribe, educate and counsel regarding individualized specific dietary modifications aiming towards targeted core components such as weight, hypertension, lipid management, diabetes, heart failure and other comorbidities.;Nutrition handout(s) given to patient.    Expected Outcomes Short Term Goal: Understand basic principles of dietary content, such as calories, fat, sodium, cholesterol and  nutrients.;Long Term Goal: Adherence to prescribed nutrition plan.             Nutrition Assessments:  Nutrition Assessments - 01/17/23 1044       Rate Your Plate Scores   Pre Score 50            MEDIFICTS Score Key: ?70 Need to make dietary changes  40-70 Heart Healthy Diet ? 40 Therapeutic Level Cholesterol Diet   Flowsheet Row INTENSIVE CARDIAC REHAB from 01/15/2023 in [facility] for Heart, Vascular, & Lung Health  Picture Your Plate Total Score on Admission 50      Picture Your Plate Scores: <16 Unhealthy dietary pattern with much room for improvement. 41-50 Dietary pattern unlikely to meet recommendations for good health and room for improvement. 51-60 More healthful dietary pattern, with some room for improvement.  >60 Healthy dietary pattern, although there may be some specific behaviors that could be improved.    Nutrition  Goals Re-Evaluation:  Nutrition Goals Re-Evaluation     Row Name 01/13/23 1423 02/14/23 0841 03/14/23 1337         Goals   Current Weight 224 lb 3.3 oz (101.7 kg) 217 lb 2.5 oz (98.5 kg) 216 lb 4.3 oz (98.1 kg)     Comment lipids WNL, A1c 8.4, Cr1.6, GFR 47 No new labs; most recent labs lipids WNL, A1c 8.4, Cr1.6, GFR 47 A1c 6.9; other most recent labs lipids WNL, Cr1.6, GFR 47     Expected Outcome Fred reports motivation to lose weight with goal weight of 205#. He is currently taking semaglutide to aid with blood sugar control and weight loss. He reports eating two meals daily and wife does the majority of cooking. Patient will benefit from participation in intensive cardiac rehab for nutrition, exercise, and lifestyle modification Goals in action. Brendan [name] continues to attend the [employer] and nutrition series regularly. He is currently taking semaglutide to aid with blood sugar control and weight loss. He is down 5.3# since starting with our program. He continues to work toward increasing high fiber foods,  moderation of saturated fat, and decreased sodium. Patient will benefit from participation in intensive cardiac rehab for nutrition, exercise, and lifestyle modification Goals in action. Brendan [name] continues to attend the [employer] and nutrition series regularly. He is currently taking semaglutide to aid with blood sugar control and weight loss. He is down 6.2# since starting with our program. He continues to work toward increasing high fiber foods, moderation of saturated fat, and decreased sodium. He does eat out often but is trying to work on cooking more at home. Patient will benefit from participation in intensive cardiac rehab for nutrition, exercise, and lifestyle modification              Nutrition Goals Re-Evaluation:  Nutrition Goals Re-Evaluation     Row Name 01/13/23 1423 02/14/23 0841 03/14/23 1337         Goals   Current Weight 224 lb 3.3 oz (101.7 kg) 217 lb 2.5 oz (98.5 kg) 216 lb 4.3 oz (98.1 kg)     Comment lipids WNL, A1c 8.4, Cr1.6, GFR 47 No new labs; most recent labs lipids WNL, A1c 8.4, Cr1.6, GFR 47 A1c 6.9; other most recent labs lipids WNL, Cr1.6, GFR 47     Expected Outcome Fred reports motivation to lose weight with goal weight of 205#. He is currently taking semaglutide to aid with blood sugar control and weight loss. He reports eating two meals daily and wife does the majority of cooking. Patient will benefit from participation in intensive cardiac rehab for nutrition, exercise, and lifestyle modification Goals in action. Brendan [name] continues to attend the [employer] and nutrition series regularly. He is currently taking semaglutide to aid with blood sugar control and weight loss. He is down 5.3# since starting with our program. He continues to work toward increasing high fiber foods, moderation of saturated fat, and decreased sodium. Patient will benefit from participation in intensive cardiac rehab for nutrition, exercise, and lifestyle modification Goals in  action. Brendan [name] continues to attend the [employer] and nutrition series regularly. He is currently taking semaglutide to aid with blood sugar control and weight loss. He is down 6.2# since starting with our program. He continues to work toward increasing high fiber foods, moderation of saturated fat, and decreased sodium. He does eat out often but is trying to work on cooking more at home. Patient will benefit from participation in intensive cardiac rehab  for nutrition, exercise, and lifestyle modification              Nutrition Goals Discharge (Final Nutrition Goals Re-Evaluation):  Nutrition Goals Re-Evaluation - 03/14/23 1337       Goals   Current Weight 216 lb 4.3 oz (98.1 kg)    Comment A1c 6.9; other most recent labs lipids WNL, Cr1.6, GFR 47    Expected Outcome Goals in action. Brendan [name] continues to attend the [employer] and nutrition series regularly. He is currently taking semaglutide to aid with blood sugar control and weight loss. He is down 6.2# since starting with our program. He continues to work toward increasing high fiber foods, moderation of saturated fat, and decreased sodium. He does eat out often but is trying to work on cooking more at home. Patient will benefit from participation in intensive cardiac rehab for nutrition, exercise, and lifestyle modification             Psychosocial: Target Goals: Acknowledge presence or absence of significant depression and/or stress, maximize coping skills, provide positive support system. Participant is able to verbalize types and ability to use techniques and skills needed for reducing stress and depression.  Initial Review & Psychosocial Screening:  Initial Psych Review & Screening - 01/07/23 1448       Initial Review   Current issues with Current Depression;History of Depression;Current Anxiety/Panic      Family Dynamics   Good Support System? Yes   Brendan [name] has his wife and his wifes family for support who live in  the area   Comments Brendan [name] admits to suffering from severe depression. Brendan [name] takes an antidepressant and receives counselling every 6 months. Fred's next counselling session will be on April 20th at the [facility]      Barriers   Psychosocial barriers to participate in program The patient should benefit from training in stress management and relaxation.      Screening Interventions   Interventions Encouraged to exercise;Provide feedback about the scores to participant    Expected Outcomes Long Term Goal: Stressors or current issues are controlled or eliminated.;Short Term goal: Identification and review with participant of any Quality of Life or Depression concerns found by scoring the questionnaire.;Long Term goal: The participant improves quality of Life and PHQ9 Scores as seen by post scores and/or verbalization of changes             Quality of Life Scores:  Quality of Life - 01/07/23 1315       Quality of Life   Select Quality of Life      Quality of Life Scores   Health/Function Pre 17.1 %    Socioeconomic Pre 23.06 %    Psych/Spiritual Pre 25.71 %    Family Pre 28.3 %    GLOBAL Pre 21.79 %            Scores of 19 and below usually indicate a poorer quality of life in these areas.  A difference of  2-3 points is a clinically meaningful difference.  A difference of 2-3 points in the total score of the Quality of Life Index has been associated with significant improvement in overall quality of life, self-image, physical symptoms, and general health in studies assessing change in quality of life.  PHQ-9: Review Flowsheet       01/07/2023  Depression screen PHQ 2/9  Decreased Interest 3  Down, Depressed, Hopeless 2  PHQ - 2 Score 5  Altered sleeping 3  Tired, decreased energy 3  Change in appetite 2  Feeling bad or failure about yourself  2  Trouble concentrating 3  Moving slowly or fidgety/restless 1  Suicidal thoughts 0  PHQ-9 Score 19  Difficult doing work/chores  Somewhat difficult   Interpretation of Total Score  Total Score Depression Severity:  1-4 = Minimal depression, 5-9 = Mild depression, 10-14 = Moderate depression, 15-19 = Moderately severe depression, 20-27 = Severe depression   Psychosocial Evaluation and Intervention:   Psychosocial Re-Evaluation:  Psychosocial Re-Evaluation     Row Name 01/13/23 1440 02/06/23 1641 03/04/23 1807 04/01/23 1512       Psychosocial Re-Evaluation   Current issues with Current Depression;Current Anxiety/Panic;History of Depression Current Depression;Current Anxiety/Panic;History of Depression Current Depression;Current Anxiety/Panic;History of Depression Current Depression;Current Anxiety/Panic;History of Depression    Comments Brendan [name] did not voice any increased concerns or stressors on her his first day of exercise at intensive cardiac rehab. Brendan [name] has not voiced any increased concerns or stressors  at intensive cardiac rehab. Brendan [name] has a follow up appointment with his therapist on 02/17/23. Brendan [name] says participating in intensive cardiac rehab has been helpful for him Brendan [name] has not voiced any increased concerns or stressors  at intensive cardiac rehab. Brendan [name] says participating in intensive cardiac rehab has been helpful for him Brendan [name] has not voiced any increased concerns or stressors  at intensive cardiac rehab. Brendan [name] says participating in intensive cardiac rehab has been helpful for him. Brendan [name] will complete intensive cardiac rehab on 04/04/23    Expected Outcomes Brendan [name] will have controlled or decreased depression/ anxiety upon completion of intensive cardiac rehab Brendan [name] will have controlled or decreased depression/ anxiety upon completion of intensive cardiac rehab Brendan [name] will have controlled or decreased depression/ anxiety upon completion of intensive cardiac rehab Brendan [name] will have controlled or decreased depression/ anxiety upon completion of intensive cardiac rehab    Interventions Encouraged to attend Cardiac Rehabilitation  for the exercise;Stress management education;Encouraged to attend Pulmonary Rehabilitation for the exercise Encouraged to attend Cardiac Rehabilitation for the exercise;Stress management education;Encouraged to attend Pulmonary Rehabilitation for the exercise Encouraged to attend Cardiac Rehabilitation for the exercise;Stress management education;Encouraged to attend Pulmonary Rehabilitation for the exercise Encouraged to attend Cardiac Rehabilitation for the exercise;Stress management education;Encouraged to attend Pulmonary Rehabilitation for the exercise    Continue Psychosocial Services  Follow up required by staff Follow up required by staff No Follow up required No Follow up required             Psychosocial Discharge (Final Psychosocial Re-Evaluation):  Psychosocial Re-Evaluation - 04/01/23 1512       Psychosocial Re-Evaluation   Current issues with Current Depression;Current Anxiety/Panic;History of Depression    Comments Brendan [name] has not voiced any increased concerns or stressors  at intensive cardiac rehab. Brendan [name] says participating in intensive cardiac rehab has been helpful for him. Brendan [name] will complete intensive cardiac rehab on 04/04/23    Expected Outcomes Brendan [name] will have controlled or decreased depression/ anxiety upon completion of intensive cardiac rehab    Interventions Encouraged to attend Cardiac Rehabilitation for the exercise;Stress management education;Encouraged to attend Pulmonary Rehabilitation for the exercise    Continue Psychosocial Services  No Follow up required             Vocational Rehabilitation: Provide vocational rehab assistance to qualifying candidates.   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 Participant will state understanding/return demonstration of topics presented.    Education     Row Name 01/13/23 1700     Education   Cardiac Education Topics Pritikin   Select Workshops     Workshops   Educator Exercise Physiologist   Select Psychosocial   Psychosocial Workshop Recognizing and Reducing Stress   Instruction Review Code 1- Verbalizes Understanding   Class Start Time 1148   Class Stop Time 1233   Class Time Calculation (min) 45 min    Row Name 01/15/23 1200     Education   Cardiac Education Topics Pritikin   Customer service manager   Weekly Topic Personalizing Your Pritikin Plate   Instruction Review Code 1- Verbalizes Understanding   Class Start Time 1140   Class Stop Time 1220   Class Time Calculation (min) 40 min    Row Name 01/17/23 1300     Education   Cardiac Education Topics Pritikin   Nurse, children's Exercise Physiologist   Select Psychosocial   Psychosocial Healthy Minds, Bodies, Hearts   Instruction Review Code 1- Verbalizes Understanding   Class Start Time 1151   Class Stop Time 1230   Class Time Calculation (min) 39 min    Row Name 01/20/23 1300     Education   Cardiac Education Topics Pritikin   Glass blower/designer Nutrition   Nutrition Workshop Label Reading   Instruction Review Code 1- Tax inspector   Class Start Time 1150   Class Stop Time 1233   Class Time Calculation (min) 43 min    Row Name 01/22/23 1300     Education   Cardiac Education Topics Pritikin   Customer service manager   Weekly Topic Tasty Appetizers and Snacks   Instruction Review Code 1- Verbalizes Understanding   Class Start Time 1150   Class Stop Time 1226   Class Time  Calculation (min) 36 min    Row Name 01/24/23 1200     Education   Cardiac Education Topics Pritikin   Licensed conveyancer Nutrition   Instruction Review Code 1- Verbalizes Understanding   Class Start Time 1150   Class Stop Time 1226   Class Time Calculation (min) 36 min    Row Name 01/27/23 1300     Education   Cardiac Education Topics Pritikin   Geographical information systems officer Exercise   Exercise Workshop Exercise Basics: Diplomatic Services operational officer   Instruction Review Code 1- Verbalizes Understanding   Class Start Time 1150   Class Stop Time 1235   Class Time Calculation (min) 45 min    Row Name 01/29/23 1500     Education   Cardiac Education Topics Pritikin   Customer service manager   Weekly Topic Tasty Appetizers and Snacks   Instruction Review Code 1- Verbalizes Understanding   Class Start Time 1145  Class Stop Time 1223   Class Time Calculation (min) 38 min    Row Name 01/31/23 1300     Education   Cardiac Education Topics Pritikin   Select Core Videos     Core Videos   Educator Dietitian   Select Nutrition   Nutrition Calorie Density   Instruction Review Code 1- Verbalizes Understanding   Class Start Time 1145   Class Stop Time 1225   Class Time Calculation (min) 40 min    Row Name 02/03/23 1400     Education   Cardiac Education Topics Pritikin   Nurse, children's   Educator Dietitian   Select Nutrition   Nutrition Nutrition Action Plan   Instruction Review Code 1- Verbalizes Understanding   Class Start Time 1145   Class Stop Time 1219   Class Time Calculation (min) 34 min    Row Name 02/05/23 1300     Education   Cardiac Education Topics Pritikin   Customer service manager   Weekly Topic Efficiency Cooking - Meals in a Snap   Instruction Review Code  1- Verbalizes Understanding   Class Start Time 1140   Class Stop Time 1220   Class Time Calculation (min) 40 min    Row Name 02/07/23 1400     Education   Cardiac Education Topics Pritikin   Psychologist, forensic Exercise Education   Exercise Education Move It!   Instruction Review Code 1- Verbalizes Understanding   Class Start Time 1150   Class Stop Time 1230   Class Time Calculation (min) 40 min    Row Name 02/10/23 1600     Education   Cardiac Education Topics Pritikin   Geographical information systems officer Psychosocial   Psychosocial Workshop Focused Goals, Sustainable Changes   Instruction Review Code 1- Verbalizes Understanding   Class Start Time 1150   Class Stop Time 1225   Class Time Calculation (min) 35 min    Row Name 02/12/23 1300     Education   Cardiac Education Topics Pritikin   Set designer Dietitian   Weekly Topic One-Pot Wonders   Instruction Review Code 1- Verbalizes Understanding   Class Start Time 1140   Class Stop Time 1212   Class Time Calculation (min) 32 min    Row Name 02/14/23 1400     Education   Cardiac Education Topics Pritikin   Psychologist, forensic General Education   General Education Hypertension and Heart Disease   Instruction Review Code 1- Verbalizes Understanding   Class Start Time 1147   Class Stop Time 1220   Class Time Calculation (min) 33 min    Row Name 02/19/23 1300     Education   Cardiac Education Topics Pritikin   Customer service manager   Weekly Topic Comforting Weekend Breakfasts   Instruction Review Code 1- Verbalizes Understanding   Class Start Time 1135   Class Stop Time 1223   Class Time Calculation (min) 48 min    Row Name 02/24/23 1400     Education   Cardiac  Education Topics Pritikin  Select Core Videos     Core Videos   Educator Dietitian   Nutrition Facts on Fat   Instruction Review Code 1- Verbalizes Understanding   Class Start Time 1145   Class Stop Time 1225   Class Time Calculation (min) 40 min    Row Name 02/26/23 1200     Education   Cardiac Education Topics Pritikin   Customer service manager   Weekly Topic Fast Evening Meals   Instruction Review Code 1- Verbalizes Understanding   Class Start Time 1140   Class Stop Time 1215   Class Time Calculation (min) 35 min    Row Name 03/05/23 1400     Education   Cardiac Education Topics Pritikin   Customer service manager   Weekly Topic International Cuisine- Spotlight on the [employer] Zones   Instruction Review Code 1- Verbalizes Understanding   Class Start Time 1140   Class Stop Time 1212   Class Time Calculation (min) 32 min    Row Name 03/07/23 1400     Education   Cardiac Education Topics Pritikin   Psychologist, forensic Exercise Education   Exercise Education Improving Performance   Instruction Review Code 1- Verbalizes Understanding   Class Start Time 1145   Class Stop Time 1230   Class Time Calculation (min) 45 min    Row Name 03/10/23 1300     Education   Cardiac Education Topics Pritikin   Glass blower/designer Nutrition   Nutrition Workshop Fueling a Forensic psychologist   Instruction Review Code 1- Tax inspector   Class Start Time 1140   Class Stop Time 1230   Class Time Calculation (min) 50 min    Row Name 03/12/23 1300     Education   Cardiac Education Topics Pritikin   Customer service manager   Weekly Topic Simple Sides and Sauces   Instruction Review Code 1- Verbalizes Understanding   Class Start Time 1140   Class Stop Time  1215   Class Time Calculation (min) 35 min    Row Name 03/14/23 1300     Education   Cardiac Education Topics Pritikin   Nurse, children's Exercise Physiologist   Select Psychosocial   Psychosocial How Our Thoughts Can Heal Our Hearts   Instruction Review Code 1- Verbalizes Understanding   Class Start Time 1150   Class Stop Time 1226   Class Time Calculation (min) 36 min    Row Name 03/17/23 1200     Education   Cardiac Education Topics Pritikin   Geographical information systems officer Psychosocial   Psychosocial Workshop From Head to Heart: The Power of a Healthy Outlook   Instruction Review Code 1- Verbalizes Understanding   Class Start Time 1153   Class Stop Time 1237   Class Time Calculation (min) 44 min    Row Name 03/19/23 1200     Education   Cardiac Education Topics Pritikin   Customer service manager   Weekly Topic Powerhouse Plant-Based Proteins   Instruction Review Code  1- Verbalizes Understanding   Class Start Time 1135   Class Stop Time 1210   Class Time Calculation (min) 35 min    Row Name 03/21/23 1400     Education   Cardiac Education Topics Pritikin   Psychologist, forensic General Education   General Education Hypertension and Heart Disease   Instruction Review Code 1- Verbalizes Understanding   Class Start Time 1145   Class Stop Time 1220   Class Time Calculation (min) 35 min    Row Name 03/26/23 1300     Education   Cardiac Education Topics Pritikin   Customer service manager   Weekly Topic Adding Flavor - Sodium-Free   Instruction Review Code 1- Verbalizes Understanding   Class Start Time 1138   Class Stop Time 1210   Class Time Calculation (min) 32 min    Row Name 03/31/23 1300     Education   Cardiac Education Topics Pritikin    Select Workshops     Workshops   Educator Exercise Physiologist   Select Exercise   Exercise Workshop Location manager and Fall Prevention   Instruction Review Code 1- Verbalizes Understanding   Class Start Time 1146   Class Stop Time 1226   Class Time Calculation (min) 40 min            Core Videos: Exercise    Move It!  Clinical staff conducted group or individual video education with verbal and written material and guidebook.  Patient learns the recommended Pritikin exercise program. Exercise with the goal of living a long, healthy life. Some of the health benefits of exercise include controlled diabetes, healthier blood pressure levels, improved cholesterol levels, improved heart and lung capacity, improved sleep, and better body composition. Everyone should speak with their doctor before starting or changing an exercise routine.  Biomechanical Limitations Clinical staff conducted group or individual video education with verbal and written material and guidebook.  Patient learns how biomechanical limitations can impact exercise and how we can mitigate and possibly overcome limitations to have an impactful and balanced exercise routine.  Body Composition Clinical staff conducted group or individual video education with verbal and written material and guidebook.  Patient learns that body composition (ratio of muscle mass to fat mass) is a key component to assessing overall fitness, rather than body weight alone. Increased fat mass, especially visceral belly fat, can put [place] at increased risk for metabolic syndrome, type 2 diabetes, heart disease, and even death. It is recommended to combine diet and exercise (cardiovascular and resistance training) to improve your body composition. Seek guidance from your physician and exercise physiologist before implementing an exercise routine.  Exercise Action Plan Clinical staff conducted group or individual video education with verbal and  written material and guidebook.  Patient learns the recommended strategies to achieve and enjoy long-term exercise adherence, including variety, self-motivation, self-efficacy, and positive decision making. Benefits of exercise include fitness, good health, weight management, more energy, better sleep, less stress, and overall well-being.  Medical   Heart Disease Risk Reduction Clinical staff conducted group or individual video education with verbal and written material and guidebook.  Patient learns our heart is our most vital organ as it circulates oxygen, nutrients, white blood cells, and hormones throughout the entire body, and carries waste away. Data supports a plant-based eating plan like the Pritikin Program for its effectiveness  in slowing progression of and reversing heart disease. The video provides a number of recommendations to address heart disease.   Metabolic Syndrome and Belly Fat  Clinical staff conducted group or individual video education with verbal and written material and guidebook.  Patient learns what metabolic syndrome is, how it leads to heart disease, and how one can reverse it and keep it from coming back. You have metabolic syndrome if you have 3 of the following 5 criteria: abdominal obesity, high blood pressure, high triglycerides, low HDL cholesterol, and high blood sugar.  Hypertension and Heart Disease Clinical staff conducted group or individual video education with verbal and written material and guidebook.  Patient learns that high blood pressure, or hypertension, is very common in the [place]. Hypertension is largely due to excessive salt intake, but other important risk factors include being overweight, physical inactivity, drinking too much alcohol, smoking, and not eating enough potassium from fruits and vegetables. High blood pressure is a leading risk factor for heart attack, stroke, congestive heart failure, dementia, kidney failure, and premature  death. Long-term effects of excessive salt intake include stiffening of the arteries and thickening of heart muscle and organ damage. Recommendations include ways to reduce hypertension and the risk of heart disease.  Diseases of Our Time - Focusing on Diabetes Clinical staff conducted group or individual video education with verbal and written material and guidebook.  Patient learns why the best way to stop diseases of our time is prevention, through food and other lifestyle changes. Medicine (such as prescription pills and surgeries) is often only a Band-Aid on the problem, not a long-term solution. Most common diseases of our time include obesity, type 2 diabetes, hypertension, heart disease, and cancer. The Pritikin Program is recommended and has been proven to help reduce, reverse, and/or prevent the damaging effects of metabolic syndrome.  Nutrition   Overview of the Pritikin Eating Plan  Clinical staff conducted group or individual video education with verbal and written material and guidebook.  Patient learns about the Pritikin Eating Plan for disease risk reduction. The Pritikin Eating Plan emphasizes a wide variety of unrefined, minimally-processed carbohydrates, like fruits, vegetables, whole grains, and legumes. Go, Caution, and Stop food choices are explained. Plant-based and lean animal proteins are emphasized. Rationale provided for low sodium intake for blood pressure control, low added sugars for blood sugar stabilization, and low added fats and oils for coronary artery disease risk reduction and weight management.  Calorie Density  Clinical staff conducted group or individual video education with verbal and written material and guidebook.  Patient learns about calorie density and how it impacts the Pritikin Eating Plan. Knowing the characteristics of the food you choose will help you decide whether those foods will lead to weight gain or weight loss, and whether you want to consume  more or less of them. Weight loss is usually a side effect of the Pritikin Eating Plan because of its focus on low calorie-dense foods.  Label Reading  Clinical staff conducted group or individual video education with verbal and written material and guidebook.  Patient learns about the Pritikin recommended label reading guidelines and corresponding recommendations regarding calorie density, added sugars, sodium content, and whole grains.  Dining Out - Part 1  Clinical staff conducted group or individual video education with verbal and written material and guidebook.  Patient learns that restaurant meals can be sabotaging because they can be so high in calories, fat, sodium, and/or sugar. Patient learns recommended strategies on how to  positively address this and avoid unhealthy pitfalls.  Facts on Fats  Clinical staff conducted group or individual video education with verbal and written material and guidebook.  Patient learns that lifestyle modifications can be just as effective, if not more so, as many medications for lowering your risk of heart disease. A Pritikin lifestyle can help to reduce your risk of inflammation and atherosclerosis (cholesterol build-up, or plaque, in the artery walls). Lifestyle interventions such as dietary choices and physical activity address the cause of atherosclerosis. A review of the types of fats and their impact on blood cholesterol levels, along with dietary recommendations to reduce fat intake is also included.  Nutrition Action Plan  Clinical staff conducted group or individual video education with verbal and written material and guidebook.  Patient learns how to incorporate Pritikin recommendations into their lifestyle. Recommendations include planning and keeping personal health goals in mind as an important part of their success.  Healthy Mind-Set    Healthy Minds, Bodies, Hearts  Clinical staff conducted group or individual video education with verbal and  written material and guidebook.  Patient learns how to identify when they are stressed. Video will discuss the impact of that stress, as well as the many benefits of stress management. Patient will also be introduced to stress management techniques. The way we think, act, and feel has an impact on our hearts.  How Our Thoughts Can Heal Our Hearts  Clinical staff conducted group or individual video education with verbal and written material and guidebook.  Patient learns that negative thoughts can cause depression and anxiety. This can result in negative lifestyle behavior and serious health problems. Cognitive behavioral therapy is an effective method to help control our thoughts in order to change and improve our emotional outlook.  Additional Videos:  Exercise    Improving Performance  Clinical staff conducted group or individual video education with verbal and written material and guidebook.  Patient learns to use a non-linear approach by alternating intensity levels and lengths of time spent exercising to help burn more calories and lose more body fat. Cardiovascular exercise helps improve heart health, metabolism, hormonal balance, blood sugar control, and recovery from fatigue. Resistance training improves strength, endurance, balance, coordination, reaction time, metabolism, and muscle mass. Flexibility exercise improves circulation, posture, and balance. Seek guidance from your physician and exercise physiologist before implementing an exercise routine and learn your capabilities and proper form for all exercise.  Introduction to Yoga  Clinical staff conducted group or individual video education with verbal and written material and guidebook.  Patient learns about yoga, a discipline of the coming together of mind, breath, and body. The benefits of yoga include improved flexibility, improved range of motion, better posture and core strength, increased lung function, weight loss, and positive  self-image. Yoga's heart health benefits include lowered blood pressure, healthier heart rate, decreased cholesterol and triglyceride levels, improved immune function, and reduced stress. Seek guidance from your physician and exercise physiologist before implementing an exercise routine and learn your capabilities and proper form for all exercise.  Medical   Aging: Enhancing Your Quality of Life  Clinical staff conducted group or individual video education with verbal and written material and guidebook.  Patient learns key strategies and recommendations to stay in good physical health and enhance quality of life, such as prevention strategies, having an advocate, securing a Health Care Proxy and Power of Attorney, and keeping a list of medications and system for tracking them. It also discusses how to avoid risk  for bone loss.  Biology of Weight Control  Clinical staff conducted group or individual video education with verbal and written material and guidebook.  Patient learns that weight gain occurs because we consume more calories than we burn (eating more, moving less). Even if your body weight is normal, you may have higher ratios of fat compared to muscle mass. Too much body fat puts you at increased risk for cardiovascular disease, heart attack, stroke, type 2 diabetes, and obesity-related cancers. In addition to exercise, following the Pritikin Eating Plan can help reduce your risk.  Decoding Lab Results  Clinical staff conducted group or individual video education with verbal and written material and guidebook.  Patient learns that lab test reflects one measurement whose values change over time and are influenced by many factors, including medication, stress, sleep, exercise, food, hydration, pre-existing medical conditions, and more. It is recommended to use the knowledge from this video to become more involved with your lab results and evaluate your numbers to speak with your  doctor.   Diseases of Our Time - Overview  Clinical staff conducted group or individual video education with verbal and written material and guidebook.  Patient learns that according to the CDC, 50% to 70% of chronic diseases (such as obesity, type 2 diabetes, elevated lipids, hypertension, and heart disease) are avoidable through lifestyle improvements including healthier food choices, listening to satiety cues, and increased physical activity.  Sleep Disorders Clinical staff conducted group or individual video education with verbal and written material and guidebook.  Patient learns how good quality and duration of sleep are important to overall health and well-being. Patient also learns about sleep disorders and how they impact health along with recommendations to address them, including discussing with a physician.  Nutrition  Dining Out - Part 2 Clinical staff conducted group or individual video education with verbal and written material and guidebook.  Patient learns how to plan ahead and communicate in order to maximize their dining experience in a healthy and nutritious manner. Included are recommended food choices based on the type of restaurant the patient is visiting.   Fueling a Banker conducted group or individual video education with verbal and written material and guidebook.  There is a strong connection between our food choices and our health. Diseases like obesity and type 2 diabetes are very prevalent and are in large-part due to lifestyle choices. The Pritikin Eating Plan provides plenty of food and hunger-curbing satisfaction. It is easy to follow, affordable, and helps reduce health risks.  Menu Workshop  Clinical staff conducted group or individual video education with verbal and written material and guidebook.  Patient learns that restaurant meals can sabotage health goals because they are often packed with calories, fat, sodium, and sugar.  Recommendations include strategies to plan ahead and to communicate with the manager, chef, or server to help order a healthier meal.  Planning Your Eating Strategy  Clinical staff conducted group or individual video education with verbal and written material and guidebook.  Patient learns about the Pritikin Eating Plan and its benefit of reducing the risk of disease. The Pritikin Eating Plan does not focus on calories. Instead, it emphasizes high-quality, nutrient-rich foods. By knowing the characteristics of the foods, we choose, we can determine their calorie density and make informed decisions.  Targeting Your Nutrition Priorities  Clinical staff conducted group or individual video education with verbal and written material and guidebook.  Patient learns that lifestyle habits have a tremendous impact  on disease risk and progression. This video provides eating and physical activity recommendations based on your personal health goals, such as reducing LDL cholesterol, losing weight, preventing or controlling type 2 diabetes, and reducing high blood pressure.  Vitamins and Minerals  Clinical staff conducted group or individual video education with verbal and written material and guidebook.  Patient learns different ways to obtain key vitamins and minerals, including through a recommended healthy diet. It is important to discuss all supplements you take with your doctor.   Healthy Mind-Set    Smoking Cessation  Clinical staff conducted group or individual video education with verbal and written material and guidebook.  Patient learns that cigarette smoking and tobacco addiction pose a serious health risk which affects millions of people. Stopping smoking will significantly reduce the risk of heart disease, lung disease, and many forms of cancer. Recommended strategies for quitting are covered, including working with your doctor to develop a successful plan.  Culinary   Becoming a Corporate investment banker conducted group or individual video education with verbal and written material and guidebook.  Patient learns that cooking at home can be healthy, cost-effective, quick, and puts them in control. Keys to cooking healthy recipes will include looking at your recipe, assessing your equipment needs, planning ahead, making it simple, choosing cost-effective seasonal ingredients, and limiting the use of added fats, salts, and sugars.  Cooking - Breakfast and Snacks  Clinical staff conducted group or individual video education with verbal and written material and guidebook.  Patient learns how important breakfast is to satiety and nutrition through the entire day. Recommendations include key foods to eat during breakfast to help stabilize blood sugar levels and to prevent overeating at meals later in the day. Planning ahead is also a key component.  Cooking - Educational psychologist conducted group or individual video education with verbal and written material and guidebook.  Patient learns eating strategies to improve overall health, including an approach to cook more at home. Recommendations include thinking of animal protein as a side on your plate rather than center stage and focusing instead on lower calorie dense options like vegetables, fruits, whole grains, and plant-based proteins, such as beans. Making sauces in large quantities to freeze for later and leaving the skin on your vegetables are also recommended to maximize your experience.  Cooking - Healthy Salads and Dressing Clinical staff conducted group or individual video education with verbal and written material and guidebook.  Patient learns that vegetables, fruits, whole grains, and legumes are the foundations of the Pritikin Eating Plan. Recommendations include how to incorporate each of these in flavorful and healthy salads, and how to create homemade salad dressings. Proper handling of ingredients is also covered.  Cooking - Soups and [employer] - Soups and Desserts Clinical staff conducted group or individual video education with verbal and written material and guidebook.  Patient learns that Pritikin soups and desserts make for easy, nutritious, and delicious snacks and meal components that are low in sodium, fat, sugar, and calorie density, while high in vitamins, minerals, and filling fiber. Recommendations include simple and healthy ideas for soups and desserts.   Overview     The Pritikin Solution Program Overview Clinical staff conducted group or individual video education with verbal and written material and guidebook.  Patient learns that the results of the Pritikin Program have been documented in more than 100 articles published in peer-reviewed journals, and the benefits include reducing risk factors  for (and, in some cases, even reversing) high cholesterol, high blood pressure, type 2 diabetes, obesity, and more! An overview of the three key pillars of the Pritikin Program will be covered: eating well, doing regular exercise, and having a healthy mind-set.  WORKSHOPS  Exercise: Exercise Basics: Building Your Action Plan Clinical staff led group instruction and group discussion with PowerPoint presentation and patient guidebook. To enhance the learning environment the use of posters, models and videos may be added. At the conclusion of this workshop, patients will comprehend the difference between physical activity and exercise, as well as the benefits of incorporating both, into their routine. Patients will understand the FITT (Frequency, Intensity, Time, and Type) principle and how to use it to build an exercise action plan. In addition, safety concerns and other considerations for exercise and cardiac rehab will be addressed by the presenter. The purpose of this lesson is to promote a comprehensive and effective weekly exercise routine in order to improve patients' overall level of  fitness.   Managing Heart Disease: Your Path to a Healthier Heart Clinical staff led group instruction and group discussion with PowerPoint presentation and patient guidebook. To enhance the learning environment the use of posters, models and videos may be added.At the conclusion of this workshop, patients will understand the anatomy and physiology of the heart. Additionally, they will understand how Pritikin's three pillars impact the risk factors, the progression, and the management of heart disease.  The purpose of this lesson is to provide a high-level overview of the heart, heart disease, and how the Pritikin lifestyle positively impacts risk factors.  Exercise Biomechanics Clinical staff led group instruction and group discussion with PowerPoint presentation and patient guidebook. To enhance the learning environment the use of posters, models and videos may be added. Patients will learn how the structural parts of their bodies function and how these functions impact their daily activities, movement, and exercise. Patients will learn how to promote a neutral spine, learn how to manage pain, and identify ways to improve their physical movement in order to promote healthy living. The purpose of this lesson is to expose patients to common physical limitations that impact physical activity. Participants will learn practical ways to adapt and manage aches and pains, and to minimize their effect on regular exercise. Patients will learn how to maintain good posture while sitting, walking, and lifting.  Balance Training and Fall Prevention  Clinical staff led group instruction and group discussion with PowerPoint presentation and patient guidebook. To enhance the learning environment the use of posters, models and videos may be added. At the conclusion of this workshop, patients will understand the importance of their sensorimotor skills (vision, proprioception, and the vestibular system)  in maintaining their ability to balance as they age. Patients will apply a variety of balancing exercises that are appropriate for their current level of function. Patients will understand the common causes for poor balance, possible solutions to these problems, and ways to modify their physical environment in order to minimize their fall risk. The purpose of this lesson is to teach patients about the importance of maintaining balance as they age and ways to minimize their risk of falling.  WORKSHOPS   Nutrition:  Fueling a Ship broker led group instruction and group discussion with PowerPoint presentation and patient guidebook. To enhance the learning environment the use of posters, models and videos may be added. Patients will review the foundational principles of the Pritikin Eating Plan and understand what constitutes a serving  size in each of the food groups. Patients will also learn Pritikin-friendly foods that are better choices when away from home and review make-ahead meal and snack options. Calorie density will be reviewed and applied to three nutrition priorities: weight maintenance, weight loss, and weight gain. The purpose of this lesson is to reinforce (in a group setting) the key concepts around what patients are recommended to eat and how to apply these guidelines when away from home by planning and selecting Pritikin-friendly options. Patients will understand how calorie density may be adjusted for different weight management goals.  Mindful Eating  Clinical staff led group instruction and group discussion with PowerPoint presentation and patient guidebook. To enhance the learning environment the use of posters, models and videos may be added. Patients will briefly review the concepts of the Pritikin Eating Plan and the importance of low-calorie dense foods. The concept of mindful eating will be introduced as well as the importance of paying attention to internal hunger  signals. Triggers for non-hunger eating and techniques for dealing with triggers will be explored. The purpose of this lesson is to provide patients with the opportunity to review the basic principles of the Pritikin Eating Plan, discuss the value of eating mindfully and how to measure internal cues of hunger and fullness using the Hunger Scale. Patients will also discuss reasons for non-hunger eating and learn strategies to use for controlling emotional eating.  Targeting Your Nutrition Priorities Clinical staff led group instruction and group discussion with PowerPoint presentation and patient guidebook. To enhance the learning environment the use of posters, models and videos may be added. Patients will learn how to determine their genetic susceptibility to disease by reviewing their family history. Patients will gain insight into the importance of diet as part of an overall healthy lifestyle in mitigating the impact of genetics and other environmental insults. The purpose of this lesson is to provide patients with the opportunity to assess their personal nutrition priorities by looking at their family history, their own health history and current risk factors. Patients will also be able to discuss ways of prioritizing and modifying the Pritikin Eating Plan for their highest risk areas  Menu  Clinical staff led group instruction and group discussion with PowerPoint presentation and patient guidebook. To enhance the learning environment the use of posters, models and videos may be added. Using menus brought in from [employer], or printed from [employer], patients will apply the Pritikin dining out guidelines that were presented in the [employer] video. Patients will also be able to practice these guidelines in a variety of provided scenarios. The purpose of this lesson is to provide patients with the opportunity to practice hands-on learning of the Pritikin Dining Out guidelines  with actual menus and practice scenarios.  Label Reading Clinical staff led group instruction and group discussion with PowerPoint presentation and patient guidebook. To enhance the learning environment the use of posters, models and videos may be added. Patients will review and discuss the Pritikin label reading guidelines presented in Pritikin's Label Reading Educational series video. Using fool labels brought in from local grocery stores and markets, patients will apply the label reading guidelines and determine if the packaged food meet the Pritikin guidelines. The purpose of this lesson is to provide patients with the opportunity to review, discuss, and practice hands-on learning of the Pritikin Label Reading guidelines with actual packaged food labels. Cooking School  Pritikin's [employer] are designed to teach patients ways to prepare  quick, simple, and affordable recipes at home. The importance of nutrition's role in chronic disease risk reduction is reflected in its emphasis in the overall Pritikin program. By learning how to prepare essential core Pritikin Eating Plan recipes, patients will increase control over what they eat; be able to customize the flavor of foods without the use of added salt, sugar, or fat; and improve the quality of the food they consume. By learning a set of core recipes which are easily assembled, quickly prepared, and affordable, patients are more likely to prepare more healthy foods at home. These workshops focus on convenient breakfasts, simple entres, side dishes, and desserts which can be prepared with minimal effort and are consistent with nutrition recommendations for cardiovascular risk reduction. Cooking [employer] are taught by a Armed forces logistics/support/administrative officer (RD) who has been trained by the [employer]. The chef or RD has a clear understanding of the importance of minimizing - if not completely eliminating - added fat, sugar, and  sodium in recipes. Throughout the series of Cooking School Workshop sessions, patients will learn about healthy ingredients and efficient methods of cooking to build confidence in their capability to prepare    Cooking School weekly topics:  Adding Flavor- Sodium-Free  Fast and Healthy Breakfasts  Powerhouse Plant-Based Proteins  Satisfying Salads and Dressings  Simple Sides and Sauces  International Cuisine-Spotlight on the [employer] Zones  Delicious Desserts  Savory Soups  [employer] - Meals in a Astronomer Appetizers and Snacks  Comforting Weekend Breakfasts  One-Pot Wonders   Fast Evening Meals  Landscape architect Your Pritikin Plate  WORKSHOPS   Healthy Mindset (Psychosocial):  Focused Goals, Sustainable Changes Clinical staff led group instruction and group discussion with PowerPoint presentation and patient guidebook. To enhance the learning environment the use of posters, models and videos may be added. Patients will be able to apply effective goal setting strategies to establish at least one personal goal, and then take consistent, meaningful action toward that goal. They will learn to identify common barriers to achieving personal goals and develop strategies to overcome them. Patients will also gain an understanding of how our mind-set can impact our ability to achieve goals and the importance of cultivating a positive and growth-oriented mind-set. The purpose of this lesson is to provide patients with a deeper understanding of how to set and achieve personal goals, as well as the tools and strategies needed to overcome common obstacles which may arise along the way.  From Head to Heart: The Power of a Healthy Outlook  Clinical staff led group instruction and group discussion with PowerPoint presentation and patient guidebook. To enhance the learning environment the use of posters, models and videos may be added. Patients will be able to recognize and  describe the impact of emotions and mood on physical health. They will discover the importance of self-care and explore self-care practices which may work for them. Patients will also learn how to utilize the 4 C's to cultivate a healthier outlook and better manage stress and challenges. The purpose of this lesson is to demonstrate to patients how a healthy outlook is an essential part of maintaining good health, especially as they continue their cardiac rehab journey.  Healthy Sleep for a Healthy Heart Clinical staff led group instruction and group discussion with PowerPoint presentation and patient guidebook. To enhance the learning environment the use of posters, models and videos may be added. At the conclusion of this workshop, patients will be  able to demonstrate knowledge of the importance of sleep to overall health, well-being, and quality of life. They will understand the symptoms of, and treatments for, common sleep disorders. Patients will also be able to identify daytime and nighttime behaviors which impact sleep, and they will be able to apply these tools to help manage sleep-related challenges. The purpose of this lesson is to provide patients with a general overview of sleep and outline the importance of quality sleep. Patients will learn about a few of the most common sleep disorders. Patients will also be introduced to the concept of "sleep hygiene," and discover ways to self-manage certain sleeping problems through simple daily behavior changes. Finally, the workshop will motivate patients by clarifying the links between quality sleep and their goals of heart-healthy living.   Recognizing and Reducing Stress Clinical staff led group instruction and group discussion with PowerPoint presentation and patient guidebook. To enhance the learning environment the use of posters, models and videos may be added. At the conclusion of this workshop, patients will be able to understand the types of stress  reactions, differentiate between acute and chronic stress, and recognize the impact that chronic stress has on their health. They will also be able to apply different coping mechanisms, such as reframing negative self-talk. Patients will have the opportunity to practice a variety of stress management techniques, such as deep abdominal breathing, progressive muscle relaxation, and/or guided imagery.  The purpose of this lesson is to educate patients on the role of stress in their lives and to provide healthy techniques for coping with it.  Learning Barriers/Preferences:  Learning Barriers/Preferences - 01/07/23 1316       Learning Barriers/Preferences   Learning Barriers None    Learning Preferences Verbal Instruction;Video;Computer/Internet;Written Material;Audio;Group Instruction;Individual Instruction;Pictoral;Skilled Demonstration             Education Topics:  Knowledge Questionnaire Score:  Knowledge Questionnaire Score - 01/07/23 1318       Knowledge Questionnaire Score   Pre Score 18/24             Core Components/Risk Factors/Patient Goals at Admission:  Personal Goals and Risk Factors at Admission - 01/07/23 1316       Core Components/Risk Factors/Patient Goals on Admission    Weight Management Yes;Obesity;Weight Loss    Intervention Weight Management: Develop a combined nutrition and exercise program designed to reach desired caloric intake, while maintaining appropriate intake of nutrient and fiber, sodium and fats, and appropriate energy expenditure required for the weight goal.;Weight Management: Provide education and appropriate resources to help participant work on and attain dietary goals.;Weight Management/Obesity: Establish reasonable short term and long term weight goals.    Admit Weight 222 lb 7.1 oz (100.9 kg)    Goal Weight: Long Term 199 lb (90.3 kg)   Pt goal   Expected Outcomes Short Term: Continue to assess and modify interventions until short term  weight is achieved;Long Term: Adherence to nutrition and physical activity/exercise program aimed toward attainment of established weight goal;Weight Maintenance: Understanding of the daily nutrition guidelines, which includes 25-35% calories from fat, 7% or less cal from saturated fats, less than 200mg  cholesterol, less than 1.5gm of sodium, & 5 or more servings of fruits and vegetables daily;Weight Loss: Understanding of general recommendations for a balanced deficit meal plan, which promotes 1-2 lb weight loss per week and includes a negative energy balance of [phone] kcal/d;Understanding recommendations for meals to include 15-35% energy as protein, 25-35% energy from fat, 35-60% energy from carbohydrates, less than  200mg  of dietary cholesterol, 20-35 gm of total fiber daily;Understanding of distribution of calorie intake throughout the day with the consumption of 4-5 meals/snacks    Diabetes Yes    Intervention Provide education about signs/symptoms and action to take for hypo/hyperglycemia.;Provide education about proper nutrition, including hydration, and aerobic/resistive exercise prescription along with prescribed medications to achieve blood glucose in normal ranges: Fasting glucose 65-99 mg/dL    Expected Outcomes Short Term: Participant verbalizes understanding of the signs/symptoms and immediate care of hyper/hypoglycemia, proper foot care and importance of medication, aerobic/resistive exercise and nutrition plan for blood glucose control.;Long Term: Attainment of HbA1C < 7%.    Hypertension Yes    Intervention Provide education on lifestyle modifcations including regular physical activity/exercise, weight management, moderate sodium restriction and increased consumption of fresh fruit, vegetables, and low fat dairy, alcohol moderation, and smoking cessation.;Monitor prescription use compliance.    Expected Outcomes Short Term: Continued assessment and intervention until BP is < 140/53mm HG in  hypertensive participants. < 130/40mm HG in hypertensive participants with diabetes, heart failure or chronic kidney disease.;Long Term: Maintenance of blood pressure at goal levels.    Lipids Yes    Intervention Provide education and support for participant on nutrition & aerobic/resistive exercise along with prescribed medications to achieve LDL 70mg , HDL >40mg .    Expected Outcomes Short Term: Participant states understanding of desired cholesterol values and is compliant with medications prescribed. Participant is following exercise prescription and nutrition guidelines.;Long Term: Cholesterol controlled with medications as prescribed, with individualized exercise RX and with personalized nutrition plan. Value goals: LDL < 70mg , HDL > 40 mg.    Stress Yes    Intervention Offer individual and/or small group education and counseling on adjustment to heart disease, stress management and health-related lifestyle change. Teach and support self-help strategies.;Refer participants experiencing significant psychosocial distress to appropriate mental health specialists for further evaluation and treatment. When possible, include family members and significant others in education/counseling sessions.    Expected Outcomes Short Term: Participant demonstrates changes in health-related behavior, relaxation and other stress management skills, ability to obtain effective social support, and compliance with psychotropic medications if prescribed.;Long Term: Emotional wellbeing is indicated by absence of clinically significant psychosocial distress or social isolation.             Core Components/Risk Factors/Patient Goals Review:   Goals and Risk Factor Review     Row Name 01/13/23 1443 02/06/23 1644 03/04/23 1808 04/01/23 1514       Core Components/Risk Factors/Patient Goals Review   Personal Goals Review Weight Management/Obesity;Lipids;Diabetes;Hypertension;Stress Weight  Management/Obesity;Lipids;Diabetes;Hypertension;Stress Weight Management/Obesity;Lipids;Diabetes;Hypertension;Stress Weight Management/Obesity;Lipids;Diabetes;Hypertension;Stress    Review Brendan [name] started intensive cardiac rehab on 01/13/23. Fred did well with exercise, vital signs and CBG's were stable. Emotional support and encouragment  provided as Brendan [name] said he was nervous about exercise. Brendan [name] is doing well with exercise at  intensive cardiac rehab on 01/13/23., vital signs and CBG's have been stable. Brendan [name] is enjoying particioating in the program. Brendan [name] is doing well with exercise at  intensive cardiac rehab, Vital signs and CBG's have been stable. Brendan [name] is enjoying particioating in the program. Brendan [name] has lost 1.4 kg since starting the program Brendan [name] is doing well with exercise at  intensive cardiac rehab, Vital signs and CBG's remain stable. Brendan [name] is enjoying particioating in the program. Brendan [name] has lost 2.5  kg since starting the program    Expected Outcomes Brendan [name] will continue to participate in intensive cardiac rehab for exercise, nutrition and lifestyle modifications Brendan [name] will continue to  participate in intensive cardiac rehab for exercise, nutrition and lifestyle modifications Brendan [name] will continue to participate in intensive cardiac rehab for exercise, nutrition and lifestyle modifications Brendan [name] will continue to participate in intensive cardiac rehab for exercise, nutrition and lifestyle modifications             Core Components/Risk Factors/Patient Goals at Discharge (Final Review):   Goals and Risk Factor Review - 04/01/23 1514       Core Components/Risk Factors/Patient Goals Review   Personal Goals Review Weight Management/Obesity;Lipids;Diabetes;Hypertension;Stress    Review Brendan [name] is doing well with exercise at  intensive cardiac rehab, Vital signs and CBG's remain stable. Brendan [name] is enjoying particioating in the program. Brendan [name] has lost 2.5  kg since starting the program    Expected Outcomes Brendan [name] will  continue to participate in intensive cardiac rehab for exercise, nutrition and lifestyle modifications             ITP Comments:  ITP Comments     Row Name 01/07/23 1308 01/13/23 1439 02/06/23 1640 03/04/23 1806 04/01/23 1511   ITP Comments Armanda Magic, MD; Medical Director. Introduction to the [facility]/Intensive Cardiac Rehab. Initial orientation packet reviewed with the patient. 30 Day ITP Review. Brendan [name] started intensive cardiac rehab on 01/13/23 and did well with exercise. 30 Day ITP Review. Brendan [name] has good attendance and participation in intensive cardiac rehab 30 Day ITP Review. Brendan [name] continues  good a participation in intensive cardiac rehab 30 Day ITP Review. Brendan [name] continues to have   good a participation and attendance  in intensive cardiac rehab. Brendan [name] will complete intensive cardiac rehab on 04/04/23            Comments: See ITP Comments.

## 2023-04-02 ENCOUNTER — Encounter (HOSPITAL_COMMUNITY): Payer: No Typology Code available for payment source

## 2023-04-02 ENCOUNTER — Encounter (HOSPITAL_COMMUNITY)
Admission: RE | Admit: 2023-04-02 | Discharge: 2023-04-02 | Disposition: A | Discharge status: Home / Self Care | Payer: No Typology Code available for payment source | Source: Ambulatory Visit | Attending: Cardiology | Admitting: Cardiology

## 2023-04-02 DIAGNOSIS — I2089 Other forms of angina pectoris: Secondary | ICD-10-CM

## 2023-04-02 NOTE — Progress Notes (Signed)
Discharge Progress Report  Patient Details  Name: Brendan [name] MRN: [id number] Date of Birth: 11-Jul-1961 Referring Provider:   Flowsheet Row INTENSIVE CARDIAC REHAB ORIENT from 01/07/2023 in [facility]  Referring Provider Peter [place], MD        Number of Visits: 76  Reason for Discharge:  Patient reached a stable level of exercise. Patient independent in their exercise. Patient has met program and personal goals.  Smoking History:  Social History   Tobacco Use  Smoking Status Never  Smokeless Tobacco Never    Diagnosis:  Chronic stable angina  ADL UCSD:   Initial Exercise Prescription:  Initial Exercise Prescription - 01/07/23 1300       Date of Initial Exercise RX and Referring Provider   Date 01/07/23    Referring Provider Peter [place], MD    Expected Discharge Date 03/21/23      Recumbant Bike   Level 2    RPM 60    Watts 52    Minutes 15    METs 3.6      Arm Ergometer   Level 1.5    Watts 64    RPM 60    Minutes 15    METs 3.6      Prescription Details   Frequency (times per week) 3    Duration Progress to 30 minutes of continuous aerobic without signs/symptoms of physical distress      Intensity   THRR 40-80% of Max Heartrate 63-126    Ratings of Perceived Exertion 11-13    Perceived Dyspnea 0-4      Progression   Progression Continue progressive overload as per policy without signs/symptoms or physical distress.      Resistance Training   Training Prescription Yes    Weight 4 lbs    Reps 10-15             Discharge Exercise Prescription (Final Exercise Prescription Changes):  Exercise Prescription Changes - 04/04/23 1400       Response to Exercise   Blood Pressure (Admit) 106/68    Blood Pressure (Exercise) 150/72    Blood Pressure (Exit) 110/70    Heart Rate (Admit) 87 bpm    Heart Rate (Exercise) 134 bpm    Heart Rate (Exit) 97 bpm    Rating of  Perceived Exertion (Exercise) 11    Symptoms None    Comments Pt graduated from the CRP2 program today    Duration Continue with 30 min of aerobic exercise without signs/symptoms of physical distress.    Intensity THRR unchanged      Progression   Progression Continue to progress workloads to maintain intensity without signs/symptoms of physical distress.    Average METs 3.3      Resistance Training   Training Prescription Yes    Weight 5 lbs    Reps 10-15    Time 10 Minutes      Interval Training   Interval Training No      Recumbant Bike   Level 3    RPM 79    Watts 48    Minutes 15    METs 3.4      Arm Ergometer   Level 4    Watts 37    RPM 51    METs 3.2      Home Exercise Plan   Plans to continue exercise at Home (comment)    Frequency Add 2 additional days to program exercise sessions.  Initial Home Exercises Provided 02/24/23             Functional Capacity:  6 Minute Walk     Row Name 01/07/23 1043 03/31/23 1308       6 Minute Walk   Phase Initial Discharge    Distance 1410 feet 1860 feet    Distance % Change -- 31.91 %    Distance Feet Change -- 450 ft    Walk Time 6 minutes 6 minutes    # of Rest Breaks 0 0    MPH 2.7 3.52    METS 3.63 4.43    RPE 10 10    Perceived Dyspnea  0 0    VO2 Peak 12.7 15.5    Symptoms No No    Resting HR 81 bpm 90 bpm    Resting BP 108/70 114/58    Resting Oxygen Saturation  98 % --    Exercise Oxygen Saturation  during 6 min walk 98 % --    Max Ex. HR 119 bpm 123 bpm    Max Ex. BP 138/60 130/64    2 Minute Post BP 112/62 --      Interval HR   1 Minute HR 92 --    2 Minute HR 107 --    3 Minute HR 111 --    4 Minute HR 113 --    5 Minute HR 116 --    6 Minute HR 117 --    Interval Heart Rate? Yes --      Interval Oxygen   Interval Oxygen? Yes --    Baseline Oxygen Saturation % 98 % --    1 Minute Oxygen Saturation % 94 % --    1 Minute Liters of Oxygen 0 L --    2 Minute Oxygen Saturation % 94  % --    2 Minute Liters of Oxygen 0 L --    3 Minute Oxygen Saturation % 94 % --    3 Minute Liters of Oxygen 0 L --    4 Minute Oxygen Saturation % 93 % --    4 Minute Liters of Oxygen 0 L --    5 Minute Oxygen Saturation % 96 % --    5 Minute Liters of Oxygen 0 L --    6 Minute Oxygen Saturation % 98 % --    6 Minute Liters of Oxygen 0 L --    2 Minute Post Oxygen Saturation % 100 % --             Psychological, QOL, Others - Outcomes: PHQ 2/9:    04/02/2023    2:06 PM 01/07/2023    2:58 PM  Depression screen PHQ 2/9  Decreased Interest 0 3  Down, Depressed, Hopeless 0 2  PHQ - 2 Score 0 5  Altered sleeping 1 3  Tired, decreased energy 0 3  Change in appetite 0 2  Feeling bad or failure about yourself  1 2  Trouble concentrating 1 3  Moving slowly or fidgety/restless 0 1  Suicidal thoughts 0 0  PHQ-9 Score 3 19  Difficult doing work/chores Somewhat difficult Somewhat difficult    Quality of Life:  Quality of Life - 04/04/23 1414       Quality of Life Scores   Health/Function Pre 17.1 %    Health/Function Post 24.4 %    Health/Function % Change 42.69 %    Socioeconomic Pre 23.06 %    Socioeconomic Post 27.43 %  Socioeconomic % Change  18.95 %    Psych/Spiritual Pre 25.71 %    Psych/Spiritual Post 22.29 %    Psych/Spiritual % Change -13.3 %    Family Pre 28.3 %    Family Post 26.4 %    Family % Change -6.71 %    GLOBAL Pre 21.79 %    GLOBAL Post 24.88 %    GLOBAL % Change 14.18 %             Personal Goals: Goals established at orientation with interventions provided to work toward goal.  Personal Goals and Risk Factors at Admission - 01/07/23 1316       Core Components/Risk Factors/Patient Goals on Admission    Weight Management Yes;Obesity;Weight Loss    Intervention Weight Management: Develop a combined nutrition and exercise program designed to reach desired caloric intake, while maintaining appropriate intake of nutrient and fiber, sodium  and fats, and appropriate energy expenditure required for the weight goal.;Weight Management: Provide education and appropriate resources to help participant work on and attain dietary goals.;Weight Management/Obesity: Establish reasonable short term and long term weight goals.    Admit Weight 222 lb 7.1 oz (100.9 kg)    Goal Weight: Long Term 199 lb (90.3 kg)   Pt goal   Expected Outcomes Short Term: Continue to assess and modify interventions until short term weight is achieved;Long Term: Adherence to nutrition and physical activity/exercise program aimed toward attainment of established weight goal;Weight Maintenance: Understanding of the daily nutrition guidelines, which includes 25-35% calories from fat, 7% or less cal from saturated fats, less than 200mg  cholesterol, less than 1.5gm of sodium, & 5 or more servings of fruits and vegetables daily;Weight Loss: Understanding of general recommendations for a balanced deficit meal plan, which promotes 1-2 lb weight loss per week and includes a negative energy balance of [phone] kcal/d;Understanding recommendations for meals to include 15-35% energy as protein, 25-35% energy from fat, 35-60% energy from carbohydrates, less than 200mg  of dietary cholesterol, 20-35 gm of total fiber daily;Understanding of distribution of calorie intake throughout the day with the consumption of 4-5 meals/snacks    Diabetes Yes    Intervention Provide education about signs/symptoms and action to take for hypo/hyperglycemia.;Provide education about proper nutrition, including hydration, and aerobic/resistive exercise prescription along with prescribed medications to achieve blood glucose in normal ranges: Fasting glucose 65-99 mg/dL    Expected Outcomes Short Term: Participant verbalizes understanding of the signs/symptoms and immediate care of hyper/hypoglycemia, proper foot care and importance of medication, aerobic/resistive exercise and nutrition plan for blood glucose  control.;Long Term: Attainment of HbA1C < 7%.    Hypertension Yes    Intervention Provide education on lifestyle modifcations including regular physical activity/exercise, weight management, moderate sodium restriction and increased consumption of fresh fruit, vegetables, and low fat dairy, alcohol moderation, and smoking cessation.;Monitor prescription use compliance.    Expected Outcomes Short Term: Continued assessment and intervention until BP is < 140/43mm HG in hypertensive participants. < 130/25mm HG in hypertensive participants with diabetes, heart failure or chronic kidney disease.;Long Term: Maintenance of blood pressure at goal levels.    Lipids Yes    Intervention Provide education and support for participant on nutrition & aerobic/resistive exercise along with prescribed medications to achieve LDL 70mg , HDL >40mg .    Expected Outcomes Short Term: Participant states understanding of desired cholesterol values and is compliant with medications prescribed. Participant is following exercise prescription and nutrition guidelines.;Long Term: Cholesterol controlled with medications as prescribed, with individualized exercise RX and  with personalized nutrition plan. Value goals: LDL < 70mg , HDL > 40 mg.    Stress Yes    Intervention Offer individual and/or small group education and counseling on adjustment to heart disease, stress management and health-related lifestyle change. Teach and support self-help strategies.;Refer participants experiencing significant psychosocial distress to appropriate mental health specialists for further evaluation and treatment. When possible, include family members and significant others in education/counseling sessions.    Expected Outcomes Short Term: Participant demonstrates changes in health-related behavior, relaxation and other stress management skills, ability to obtain effective social support, and compliance with psychotropic medications if prescribed.;Long  Term: Emotional wellbeing is indicated by absence of clinically significant psychosocial distress or social isolation.              Personal Goals Discharge:  Goals and Risk Factor Review     Row Name 01/13/23 1443 02/06/23 1644 03/04/23 1808 04/01/23 1514       Core Components/Risk Factors/Patient Goals Review   Personal Goals Review Weight Management/Obesity;Lipids;Diabetes;Hypertension;Stress Weight Management/Obesity;Lipids;Diabetes;Hypertension;Stress Weight Management/Obesity;Lipids;Diabetes;Hypertension;Stress Weight Management/Obesity;Lipids;Diabetes;Hypertension;Stress    Review Brendan [name] started intensive cardiac rehab on 01/13/23. Brendan [name] did well with exercise, vital signs and CBG's were stable. Emotional support and encouragment  provided as Brendan [name] said he was nervous about exercise. Brendan [name] is doing well with exercise at  intensive cardiac rehab on 01/13/23., vital signs and CBG's have been stable. Brendan [name] is enjoying particioating in the program. Brendan [name] is doing well with exercise at  intensive cardiac rehab, Vital signs and CBG's have been stable. Brendan [name] is enjoying particioating in the program. Brendan [name] has lost 1.4 kg since starting the program Brendan [name] is doing well with exercise at  intensive cardiac rehab, Vital signs and CBG's remain stable. Brendan [name] is enjoying particioating in the program. Brendan [name] has lost 2.5  kg since starting the program    Expected Outcomes Brendan [name] will continue to participate in intensive cardiac rehab for exercise, nutrition and lifestyle modifications Brendan [name] will continue to participate in intensive cardiac rehab for exercise, nutrition and lifestyle modifications Brendan [name] will continue to participate in intensive cardiac rehab for exercise, nutrition and lifestyle modifications Brendan [name] will continue to participate in intensive cardiac rehab for exercise, nutrition and lifestyle modifications             Exercise Goals and Review:  Exercise Goals     Row Name 01/07/23 1314              Exercise Goals   Increase Physical Activity Yes       Intervention Provide advice, education, support and counseling about physical activity/exercise needs.;Develop an individualized exercise prescription for aerobic and resistive training based on initial evaluation findings, risk stratification, comorbidities and participant's personal goals.       Expected Outcomes Short Term: Attend rehab on a regular basis to increase amount of physical activity.;Long Term: Add in home exercise to make exercise part of routine and to increase amount of physical activity.;Long Term: Exercising regularly at least 3-5 days a week.       Increase Strength and Stamina Yes       Intervention Provide advice, education, support and counseling about physical activity/exercise needs.;Develop an individualized exercise prescription for aerobic and resistive training based on initial evaluation findings, risk stratification, comorbidities and participant's personal goals.       Expected Outcomes Short Term: Increase workloads from initial exercise prescription for resistance, speed, and METs.;Short Term: Perform resistance training exercises routinely during rehab and add in resistance training at home;Long Term: Improve cardiorespiratory fitness,  muscular endurance and strength as measured by increased METs and functional capacity ( )       Able to understand and use rate of perceived exertion (RPE) scale Yes       Intervention Provide education and explanation on how to use RPE scale       Expected Outcomes Short Term: Able to use RPE daily in rehab to express subjective intensity level;Long Term:  Able to use RPE to guide intensity level when exercising independently       Knowledge and understanding of Target Heart Rate Range (THRR) Yes       Intervention Provide education and explanation of THRR including how the numbers were predicted and where they are located for reference       Expected Outcomes Short Term: Able  to state/look up THRR;Short Term: Able to use daily as guideline for intensity in rehab;Long Term: Able to use THRR to govern intensity when exercising independently       Understanding of Exercise Prescription Yes       Intervention Provide education, explanation, and written materials on patient's individual exercise prescription       Expected Outcomes Short Term: Able to explain program exercise prescription;Long Term: Able to explain home exercise prescription to exercise independently                Exercise Goals Re-Evaluation:  Exercise Goals Re-Evaluation     Row Name 01/13/23 1636 02/10/23 1513 03/10/23 1433 04/04/23 1605       Exercise Goal Re-Evaluation   Exercise Goals Review Increase Physical Activity;Understanding of Exercise Prescription;Increase Strength and Stamina;Knowledge and understanding of Target Heart Rate Range (THRR);Able to understand and use rate of perceived exertion (RPE) scale Increase Physical Activity;Understanding of Exercise Prescription;Increase Strength and Stamina;Knowledge and understanding of Target Heart Rate Range (THRR);Able to understand and use rate of perceived exertion (RPE) scale Increase Physical Activity;Understanding of Exercise Prescription;Increase Strength and Stamina;Knowledge and understanding of Target Heart Rate Range (THRR);Able to understand and use rate of perceived exertion (RPE) scale Increase Physical Activity;Understanding of Exercise Prescription;Increase Strength and Stamina;Knowledge and understanding of Target Heart Rate Range (THRR);Able to understand and use rate of perceived exertion (RPE) scale    Comments Pt's first day in the cRP2 program. Pt understands the exercise Rx, THRR and RPE scale. Reviewed METs and goals. Pt voices he feels that strength and stamina are improving. Pt has not achieved any significant weight loss. Reviewed METs and goals. Pt voices he feels that strength and stamina have improved.Pt has lost 2kg  since starting the program. Pt graduated from the cRP2 program today. Pt had peak METs of 3.8. Pt made good progress and plans  to contine his exercise program by walking on his treadmill and going to the gym.    Expected Outcomes Will continue to monitor the patient and progress exercise workloads as tolerated. Will continue to monitor the patient and progress exercise workloads as tolerated. Will continue to monitor the patient and progress exercise workloads as tolerated. Pt will continue to exercise @ home/gym.             Nutrition & Weight - Outcomes:  Pre Biometrics - 01/07/23 0945       Pre Biometrics   Waist Circumference 41.75 inches    Hip Circumference 41.5 inches    Waist to Hip Ratio 1.01 %    Triceps Skinfold 18 mm    % Body Fat 29.7 %    Grip Strength 54 kg  Flexibility 11.75 in    Single Leg Stand 13.43 seconds              Nutrition:  Nutrition Therapy & Goals - 03/14/23 1337       Nutrition Therapy   Diet Heart Healthy/Carbohydrate Consistent    Drug/Food Interactions Statins/Certain Fruits      Personal Nutrition Goals   Nutrition Goal Patient to improve diet quality by using the plate method as a guide for meal planning to include lean protein/plant protein, fruits, vegetables, whole grains, nonfat dairy as part of a well-balanced diet.    Personal Goal #2 Patient to identify strategies for reducing cardiovascular risk by attending the [employer] education and nutrition series weekly.    Personal Goal #3 Patient to identify strategies for improving blood sugar control with goals <7%    Comments Goals in action. Brendan [name] continues to attend the [employer] and nutrition series regularly. He is currently taking semaglutide to aid with blood sugar control and weight loss. He is down 6.2# since starting with our program. He continues to work toward increasing high fiber foods, moderation of saturated fat, and decreased sodium. He does eat out often but is  trying to work on cooking more at home. Patient will benefit from participation in intensive cardiac rehab for nutrition, exercise, and lifestyle modification      Intervention Plan   Intervention Prescribe, educate and counsel regarding individualized specific dietary modifications aiming towards targeted core components such as weight, hypertension, lipid management, diabetes, heart failure and other comorbidities.;Nutrition handout(s) given to patient.    Expected Outcomes Short Term Goal: Understand basic principles of dietary content, such as calories, fat, sodium, cholesterol and nutrients.;Long Term Goal: Adherence to prescribed nutrition plan.             Nutrition Discharge:  Nutrition Assessments - 04/07/23 0832       Rate Your Plate Scores   Pre Score 50    Post Score 50             Education Questionnaire Score:  Knowledge Questionnaire Score - 04/04/23 1414       Knowledge Questionnaire Score   Post Score 23/24             Goals reviewed with patient; copy given to patient. Brendan [name]  graduates from  [employer] cardiac rehab program today with completion of   32 exercise and  31 education sessions. Pt maintained good attendance and progressed nicely during their participation in rehab as evidenced by increased MET level.   Medication list reconciled. Repeat  PHQ score-3  .  Pt has made significant lifestyle changes and should be commended for their success. Brendan [name] increased his distance on his post exercise walk test by 450 feet  and 3.5 feet.  Brendan [name]  achieved their goals during cardiac rehab. Brendan [name] lost weight and feels more confident about exercising independently.  Pt plans to continue exercise at by walking on his treadmill at home and doing things around the house. We are proud of Brendan [name]'s progress! Brendan Headings RN BSN

## 2023-04-03 DIAGNOSIS — G4733 Obstructive sleep apnea (adult) (pediatric): Secondary | ICD-10-CM | POA: Diagnosis not present

## 2023-04-04 ENCOUNTER — Encounter (HOSPITAL_COMMUNITY)
Admission: RE | Admit: 2023-04-04 | Discharge: 2023-04-04 | Disposition: A | Discharge status: Home / Self Care | Payer: No Typology Code available for payment source | Source: Ambulatory Visit | Attending: Cardiology | Admitting: Cardiology

## 2023-04-04 DIAGNOSIS — I2089 Other forms of angina pectoris: Secondary | ICD-10-CM | POA: Diagnosis not present

## 2023-04-17 DIAGNOSIS — N5201 Erectile dysfunction due to arterial insufficiency: Secondary | ICD-10-CM | POA: Diagnosis not present

## 2023-04-17 DIAGNOSIS — E291 Testicular hypofunction: Secondary | ICD-10-CM | POA: Diagnosis not present

## 2023-06-09 ENCOUNTER — Telehealth: Payer: Self-pay | Admitting: Pulmonary Disease

## 2023-06-09 DIAGNOSIS — R269 Unspecified abnormalities of gait and mobility: Secondary | ICD-10-CM | POA: Diagnosis not present

## 2023-06-09 NOTE — Telephone Encounter (Signed)
PT wants a call to review what the PFT results from June were. States he did not understand the abbreviations. See AVS notes for results.  His # [phone]

## 2023-06-09 NOTE — Telephone Encounter (Signed)
Patient would like to know the diffusion rate of carbon dioxide pre and post from his PFT from last year.  He is still experiencing cough.  [facility]  Appointment scheduled.  Please advise.

## 2023-06-09 NOTE — Telephone Encounter (Signed)
I spoke with patient over the phone. I reviewed his PFT results from last year in depth explaining the restrictive defect and diffusion defect that he has. His main issue is on going cough. He does have reflux symptoms and nasal congestion/drainage. I reviewed with him that these can lead to on going cough and that it will be reviewed further at his upcoming visit 8/20 with you. We can likely start him on PPI therapy along with nasal sprays.   Thanks, Cletis Athens

## 2023-06-13 DIAGNOSIS — M19011 Primary osteoarthritis, right shoulder: Secondary | ICD-10-CM | POA: Diagnosis not present

## 2023-06-13 DIAGNOSIS — I2510 Atherosclerotic heart disease of native coronary artery without angina pectoris: Secondary | ICD-10-CM | POA: Diagnosis not present

## 2023-06-13 DIAGNOSIS — E1122 Type 2 diabetes mellitus with diabetic chronic kidney disease: Secondary | ICD-10-CM | POA: Diagnosis not present

## 2023-06-13 DIAGNOSIS — G4730 Sleep apnea, unspecified: Secondary | ICD-10-CM | POA: Diagnosis not present

## 2023-06-13 DIAGNOSIS — D860 Sarcoidosis of lung: Secondary | ICD-10-CM | POA: Diagnosis not present

## 2023-06-13 DIAGNOSIS — K08409 Partial loss of teeth, unspecified cause, unspecified class: Secondary | ICD-10-CM | POA: Diagnosis not present

## 2023-06-13 DIAGNOSIS — E119 Type 2 diabetes mellitus without complications: Secondary | ICD-10-CM | POA: Diagnosis not present

## 2023-06-16 DIAGNOSIS — M25511 Pain in right shoulder: Secondary | ICD-10-CM | POA: Diagnosis not present

## 2023-06-17 ENCOUNTER — Ambulatory Visit (INDEPENDENT_AMBULATORY_CARE_PROVIDER_SITE_OTHER): Payer: Federal, State, Local not specified - PPO

## 2023-06-17 ENCOUNTER — Ambulatory Visit: Payer: Federal, State, Local not specified - PPO | Admitting: Adult Health

## 2023-06-17 ENCOUNTER — Encounter: Payer: Self-pay | Admitting: Adult Health

## 2023-06-17 VITALS — BP 110/60 | HR 90 | Temp 98.3°F | Ht 70.0 in | Wt 211.2 lb

## 2023-06-17 DIAGNOSIS — D869 Sarcoidosis, unspecified: Secondary | ICD-10-CM

## 2023-06-17 DIAGNOSIS — Z8709 Personal history of other diseases of the respiratory system: Secondary | ICD-10-CM | POA: Diagnosis not present

## 2023-06-17 DIAGNOSIS — R053 Chronic cough: Secondary | ICD-10-CM | POA: Diagnosis not present

## 2023-06-17 NOTE — Patient Instructions (Addendum)
Chest xray today.  Continue on Dulera 2 puffs Twice daily  , rinse after use  Albuterol inhaler As needed   Deslym 2 tsp Twice daily  for cough , As needed   Tessalon Three times a day  for cough, as needed  Sips of water to soothe throat, to avoid throat clearing and cough .  Continue on Prilosec 20mg  daily As needed for reflux  Continue Pepcid 20mg  At bedtime for reflux  Zyrtec 10mg  At bedtime  as needed for drainage  Continue with Bi-Annual Eye exams.  Wear CPAP At bedtime  .  Activity and walking as tolerated.  Follow up with Dr. Francine Graven or Seichi Kaufhold NP in 6  months with Spirometry with [facility]CO  and As needed

## 2023-06-17 NOTE — Assessment & Plan Note (Signed)
Continue on trigger prevention.  Cough flare regimen with Delsym, Tessalon.  Zyrtec and GERD therapy for cough flare.  Chest x-ray today.  Suspect patient has a component of reactive airways.  Cough improved on Dulera.  Plan  Patient Instructions  Chest xray today.  Continue on Dulera 2 puffs Twice daily  , rinse after use  Albuterol inhaler As needed   Deslym 2 tsp Twice daily  for cough , As needed   Tessalon Three times a day  for cough, as needed  Sips of water to soothe throat, to avoid throat clearing and cough .  Continue on Prilosec 20mg  daily As needed for reflux  Continue Pepcid 20mg  At bedtime for reflux  Zyrtec 10mg  At bedtime  as needed for drainage  Continue with Bi-Annual Eye exams.  Wear CPAP At bedtime  .  Activity and walking as tolerated.  Follow up with Dr. Francine Graven or Zyire Eidson NP in 6  months with Spirometry with [facility]  and As needed

## 2023-06-17 NOTE — Assessment & Plan Note (Signed)
Appears stable.  Chest x-ray today.  Continue with yearly eye exams. Check spirometry with DLCO on return visit  Plan  Patient Instructions  Chest xray today.  Continue on Dulera 2 puffs Twice daily  , rinse after use  Albuterol inhaler As needed   Deslym 2 tsp Twice daily  for cough , As needed   Tessalon Three times a day  for cough, as needed  Sips of water to soothe throat, to avoid throat clearing and cough .  Continue on Prilosec 20mg  daily As needed for reflux  Continue Pepcid 20mg  At bedtime for reflux  Zyrtec 10mg  At bedtime  as needed for drainage  Continue with Bi-Annual Eye exams.  Wear CPAP At bedtime  .  Activity and walking as tolerated.  Follow up with Dr. Francine Graven or Joey Hudock NP in 6  months with Spirometry with DLCO  and As needed

## 2023-06-17 NOTE — Progress Notes (Signed)
@Patient  ID: Brendan [name], male    DOB: 1961/08/17, 62 y.o.   MRN: [id number]  Chief Complaint  Patient presents with   Follow-up    Referring provider: No ref. provider found  HPI: 62 year old male never smoker followed for sarcoidosis and chronic cough Diagnosed with sarcoidosis in 2010 treated with steroids for around 1 year, transition to methotrexate for 3 years.  Initial diagnosis was lymph node biopsy-pathology consistent with sarcoidosis Medical history significant for sleep apnea on CPAP followed at the [place] [facility]. History of type 2 diabetes. 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    PFTs June 03, 2022 FEV1 71%, ratio 87, FVC 61%, no significant bronchodilator response, DLCO 66%.  07/2022 R/LHC +l CAD  Echo 07/2022 EF nml   06/17/2023 Follow up : Sarcoid and Chronic cough /RAD  Patient has underlying known sarcoidosis diagnosed around 2010.  Overall says his breathing is doing okay. No increased shortness of breath. Tries to do light activities. Cough comes and goes but overall is doing better on current regimen .  Remains on Dulera twice daily.  If his cough flares he has Delsym and Tessalon for cough control.  He has Prilosec and Pepcid and Zyrtec to control for triggers.  Patient denies any fever or discolored mucus.  Gets eye exams twice yearly.  Patient is fully independent.  Drives.  Denies any hemoptysis or chest pain.  Previous PFT showed moderate restriction.  High-resolution CT chest in August 2023 showed no interstitial lung disease.  Scattered small pulmonary nodules largest measuring 0.5 cm compatible with pulmonary sarcoidosis.  Allergies  Allergen Reactions   Latex Swelling   Lisinopril Swelling    Losartan Swelling   Metformin Nausea Only   Sildenafil Palpitations    Other reaction(s): Headache, Palpitation, Headache, Palpitation, Headache, Palpitations    Immunization History  Administered Date(s) Administered   Influenza-Unspecified 07/28/2006   MMR 09/16/2007   Moderna Sars-Covid-2 Vaccination 09/19/2020   Tdap 09/16/2007, 11/06/2007   Unspecified SARS-COV-2 Vaccination 11/29/2019, 12/30/2019    Past Medical History:  Diagnosis Date   Coronary artery disease    Diabetes mellitus without complication (HCC)    Hyperlipidemia    Hypertension    Kidney disease    Sarcoidosis    Stroke (HCC)     Tobacco History: Social History   Tobacco Use  Smoking Status Never  Smokeless Tobacco Never   Counseling given: Not Answered   Outpatient Medications Prior to Visit  Medication Sig Dispense Refill   albuterol (VENTOLIN HFA) 108 (90 Base) MCG/ACT inhaler Inhale 1-2 puffs into the lungs every 6 (six) hours as needed for shortness of breath or wheezing.     amoxicillin (AMOXIL) 500 MG capsule TAKE FOUR CAPSULES BY MOUTH ONCE 1 HOUR PRIOR TO THE DENTAL TREATMENT 4 capsule 3   aspirin EC 81 MG tablet Take 81 mg by mouth at bedtime. Swallow whole.     atorvastatin (LIPITOR) 80 MG tablet Take 80 mg by mouth daily.     clobetasol cream (TEMOVATE) 0.05 %      diazepam (VALIUM) 5 MG tablet Take 5 mg by mouth daily as needed for anxiety.     EPINEPHrine 0.3 mg/0.3 mL IJ SOAJ injection Inject 0.3 mg into the muscle as needed for anaphylaxis. 1 each 0   escitalopram (LEXAPRO)  10 MG tablet Take 10 mg by mouth at bedtime.     ezetimibe (ZETIA) 10 MG tablet Take 10 mg by mouth daily.     mometasone-formoterol (DULERA) 100-5 MCG/ACT AERO Inhale 2 puffs into the lungs in the morning and at bedtime. 26.4 g 11   Semaglutide,0.25 or 0.5MG /DOS, 2 MG/3ML SOPN Inject 0.5 mg into the skin once a week.     traZODone (DESYREL) 100 MG tablet Take 100 mg by mouth at bedtime.     zolpidem  (AMBIEN) 10 MG tablet Take 10 mg by mouth at bedtime.     benzonatate (TESSALON) 200 MG capsule Take 1 capsule (200 mg total) by mouth 3 (three) times daily as needed. (Patient not taking: Reported on 06/17/2023) 60 capsule 1   atorvastatin (LIPITOR) 40 MG tablet Take 20 mg by mouth at bedtime.     No facility-administered medications prior to visit.     Review of Systems:   Constitutional:   No  weight loss, night sweats,  Fevers, chills, + fatigue, or  lassitude.  HEENT:   No headaches,  Difficulty swallowing,  Tooth/dental problems, or  Sore throat,                No sneezing, itching, ear ache, nasal congestion, post nasal drip,   CV:  No chest pain,  Orthopnea, PND, swelling in lower extremities, anasarca, dizziness, palpitations, syncope.   GI  No heartburn, indigestion, abdominal pain, nausea, vomiting, diarrhea, change in bowel habits, loss of appetite, bloody stools.   Resp: No chest wall deformity  Skin: no rash or lesions.  GU: no dysuria, change in color of urine, no urgency or frequency.  No flank pain, no hematuria   MS:  No joint pain or swelling.  No decreased range of motion.  No back pain.    Physical Exam  BP 110/60 (BP Location: Left Arm, Patient Position: Sitting, Cuff Size: Large)   Pulse 90   Temp 98.3 F (36.8 C) (Oral)   Ht 5\' 10"  (1.778 m)   Wt 211 lb 3.2 oz (95.8 kg)   SpO2 98%   BMI 30.30 kg/m   GEN: A/Ox3; pleasant , NAD, well nourished    HEENT:  [place]/AT,   NOSE-clear, THROAT-clear, no lesions, no postnasal drip or exudate noted.   NECK:  Supple w/ fair ROM; no JVD; normal carotid impulses w/o bruits; no thyromegaly or nodules palpated; no lymphadenopathy.    RESP  Clear  P & A; w/o, wheezes/ rales/ or rhonchi. no accessory muscle use, no dullness to percussion  CARD:  RRR, no m/r/g, no peripheral edema, pulses intact, no cyanosis or clubbing.  GI:   Soft & nt; nml bowel sounds; no organomegaly or masses detected.   Musco: Warm bil, no  deformities or joint swelling noted.   Neuro: alert, no focal deficits noted.    Skin: Warm, no lesions or rashes    Lab Results:     BNP No results found for: "BNP"  ProBNP No results found for: "PROBNP"  Imaging: No results found.  Administration History     None          Latest Ref Rng & Units 06/03/2022   10:04 AM  PFT Results  FVC-Pre L 3.05   FVC-Predicted Pre % 63   FVC-Post L 2.98   FVC-Predicted Post % 61   Pre FEV1/FVC % % 85   Post FEV1/FCV % % 87   FEV1-Pre L 2.59   FEV1-Predicted Pre %  71   FEV1-Post L 2.59   DLCO uncorrected ml/min/mmHg 18.61   DLCO UNC% % 66   DLCO corrected ml/min/mmHg 18.61   DLCO COR %Predicted % 66   DLVA Predicted % 97   TLC L 5.01   TLC % Predicted % 70   RV % Predicted % 75     No results found for: "NITRICOXIDE"      Assessment & Plan:   Sarcoidosis Appears stable.  Chest x-ray today.  Continue with yearly eye exams. Check spirometry with DLCO on return visit  Plan  Patient Instructions  Chest xray today.  Continue on Dulera 2 puffs Twice daily  , rinse after use  Albuterol inhaler As needed   Deslym 2 tsp Twice daily  for cough , As needed   Tessalon Three times a day  for cough, as needed  Sips of water to soothe throat, to avoid throat clearing and cough .  Continue on Prilosec 20mg  daily As needed for reflux  Continue Pepcid 20mg  At bedtime for reflux  Zyrtec 10mg  At bedtime  as needed for drainage  Continue with Bi-Annual Eye exams.  Wear CPAP At bedtime  .  Activity and walking as tolerated.  Follow up with Dr. Francine Graven or Kyan Yurkovich NP in 6  months with Spirometry with DLCO  and As needed      Chronic cough Continue on trigger prevention.  Cough flare regimen with Delsym, Tessalon.  Zyrtec and GERD therapy for cough flare.  Chest x-ray today.  Suspect patient has a component of reactive airways.  Cough improved on Dulera.  Plan  Patient Instructions  Chest xray today.  Continue on Dulera 2  puffs Twice daily  , rinse after use  Albuterol inhaler As needed   Deslym 2 tsp Twice daily  for cough , As needed   Tessalon Three times a day  for cough, as needed  Sips of water to soothe throat, to avoid throat clearing and cough .  Continue on Prilosec 20mg  daily As needed for reflux  Continue Pepcid 20mg  At bedtime for reflux  Zyrtec 10mg  At bedtime  as needed for drainage  Continue with Bi-Annual Eye exams.  Wear CPAP At bedtime  .  Activity and walking as tolerated.  Follow up with Dr. Francine Graven or Areli Jowett NP in 6  months with Spirometry with DLCO  and As needed        Rubye Oaks, NP 06/17/2023

## 2023-06-27 DIAGNOSIS — E119 Type 2 diabetes mellitus without complications: Secondary | ICD-10-CM | POA: Diagnosis not present

## 2023-06-27 DIAGNOSIS — I2510 Atherosclerotic heart disease of native coronary artery without angina pectoris: Secondary | ICD-10-CM | POA: Diagnosis not present

## 2023-06-27 DIAGNOSIS — Z8673 Personal history of transient ischemic attack (TIA), and cerebral infarction without residual deficits: Secondary | ICD-10-CM | POA: Diagnosis not present

## 2023-06-27 DIAGNOSIS — M19011 Primary osteoarthritis, right shoulder: Secondary | ICD-10-CM | POA: Diagnosis not present

## 2023-07-02 DIAGNOSIS — I2510 Atherosclerotic heart disease of native coronary artery without angina pectoris: Secondary | ICD-10-CM | POA: Diagnosis not present

## 2023-07-02 DIAGNOSIS — D860 Sarcoidosis of lung: Secondary | ICD-10-CM | POA: Diagnosis not present

## 2023-07-02 DIAGNOSIS — E1122 Type 2 diabetes mellitus with diabetic chronic kidney disease: Secondary | ICD-10-CM | POA: Diagnosis not present

## 2023-07-02 DIAGNOSIS — N183 Chronic kidney disease, stage 3 unspecified: Secondary | ICD-10-CM | POA: Diagnosis not present

## 2023-07-11 DIAGNOSIS — Z125 Encounter for screening for malignant neoplasm of prostate: Secondary | ICD-10-CM | POA: Diagnosis not present

## 2023-07-11 DIAGNOSIS — E291 Testicular hypofunction: Secondary | ICD-10-CM | POA: Diagnosis not present

## 2023-07-15 DIAGNOSIS — M25511 Pain in right shoulder: Secondary | ICD-10-CM | POA: Diagnosis not present

## 2023-08-13 NOTE — Progress Notes (Signed)
Cardiology Office Note:    Date:  08/20/2023   ID:  Gearldine [name], DOB [birth year], MRN [id number]  PCP:  Default, Provider, MD   [facility] Providers Cardiologist:  None     Referring MD: No ref. provider found   Chief Complaint  Patient presents with   Coronary Artery Disease    History of Present Illness:    Brendan [name] is a 62 y.o. male seen for follow up CAD. He had coronary and aortic calcification noted on CT. He has a history of Sarcoidosis, DM, HLD, and OSA on CPAP. He did have an Echo in March 2022 at [facility] showing EF 50-55%. Mild LVH. Mild diastolic dysfunction. There was an interatrial septal aneurysm with shunt (PFO) but agitated saline. Mild MR. Cranial MRI at that time showed 2 old lacunar infarcts and a small old cortical infarct in the posterior inferior right occipital lobe. CTA of head and neck showed no significant stenosis. He was placed on ASA by Neurology then. He does note his activity has been limited since his bilateral TKR. He does have some SOB on exertion that he attributes to his sarcoidosis. His primary care (Dr Francene Castle) in [place] follows his lab work.  He underwent Myoview study which was high risk due to low EF 33%. No ischemia. Cardiac cath was then performed showing moderate 2 vessel CAD. Normal right heart and LV filling pressures. Echo performed showed normal EF.   On follow up today he is doing well from a cardiac standpoint. No chest pain. Is followed by pulmonary for sarcoidosis and reactive airway disease. Is now on Ozempic and has lost 10 lbs. Trying to increase his activity. Labs now followed at the [place].   Past Medical History:  Diagnosis Date   Coronary artery disease    Diabetes mellitus without complication (HCC)    Hyperlipidemia    Hypertension    Kidney disease    Sarcoidosis    Stroke [facility])     Past Surgical History:  Procedure Laterality Date   CARDIAC CATHETERIZATION     JOINT REPLACEMENT  Bilateral    TKR   RIGHT/LEFT HEART CATH AND CORONARY ANGIOGRAPHY N/A 08/01/2022   Procedure: RIGHT/LEFT HEART CATH AND CORONARY ANGIOGRAPHY;  Surgeon: [place], Kreston Ahrendt M, MD;  Location: [facility] INVASIVE CV LAB;  Service: Cardiovascular;  Laterality: N/A;   ROTATOR CUFF REPAIR Left     Current Medications: Current Meds  Medication Sig   albuterol (VENTOLIN HFA) 108 (90 Base) MCG/ACT inhaler Inhale 1-2 puffs into the lungs every 6 (six) hours as needed for shortness of breath or wheezing.   amLODipine (NORVASC) 10 MG tablet Take 1 tablet by mouth daily.   amoxicillin (AMOXIL) 500 MG capsule TAKE FOUR CAPSULES BY MOUTH ONCE 1 HOUR PRIOR TO THE DENTAL TREATMENT   aspirin EC 81 MG tablet Take 81 mg by mouth at bedtime. Swallow whole.   atorvastatin (LIPITOR) 80 MG tablet Take 80 mg by mouth daily.   clobetasol cream (TEMOVATE) 0.05 %    diazepam (VALIUM) 5 MG tablet Take 5 mg by mouth daily as needed for anxiety.   diclofenac Sodium (VOLTAREN) 1 % GEL Apply 2 g topically as needed.   DULoxetine (CYMBALTA) 30 MG capsule Take 30 mg by mouth 2 (two) times daily.   empagliflozin (JARDIANCE) 25 MG TABS tablet Take 0.5 tablets by mouth daily.   EPINEPHrine 0.3 mg/0.3 mL IJ SOAJ injection Inject 0.3 mg into the muscle as needed for anaphylaxis.  escitalopram (LEXAPRO) 10 MG tablet Take 10 mg by mouth at bedtime.   ezetimibe (ZETIA) 10 MG tablet Take 10 mg by mouth daily.   hydrOXYzine (ATARAX) 25 MG tablet Take 25 mg by mouth 3 (three) times daily.   mometasone-formoterol (DULERA) 100-5 MCG/ACT AERO Inhale 2 puffs into the lungs in the morning and at bedtime.   Semaglutide,0.25 or 0.5MG /DOS, 2 MG/3ML SOPN Inject 0.5 mg into the skin once a week.   traZODone (DESYREL) 100 MG tablet Take 100 mg by mouth at bedtime.   Vitamin D, Ergocalciferol, (DRISDOL) 1.25 MG (50000 UNIT) CAPS capsule Take 50,000 Units by mouth every 7 (seven) days.   zolpidem (AMBIEN) 10 MG tablet Take 10 mg by mouth at bedtime.      Allergies:   Latex, Lisinopril, Losartan, Metformin, and Sildenafil   Social History   Socioeconomic History   Marital status: Married    Spouse name: Not on file   Number of children: 3   Years of education: 16   Highest education level: Bachelor's degree (e.g., BA, AB, BS)  Occupational History   Occupation: Retired  Tobacco Use   Smoking status: Never   Smokeless tobacco: Never  Substance and Sexual Activity   Alcohol use: Not Currently   Drug use: Not Currently   Sexual activity: Not on file  Other Topics Concern   Not on file  Social History Narrative   Retired Art gallery manager for [employer] Determinants of Health   Financial Resource Strain: Low Risk  (02/11/2023)   Received from [facility], [facility]   Overall Financial Resource Strain (CARDIA)    Difficulty of Paying Living Expenses: Not hard at all  Food Insecurity: No Food Insecurity (02/11/2023)   Received from [facility], [facility]   Hunger Vital Sign    Worried About Running Out of Food in the Last Year: Never true    Ran Out of Food in the Last Year: Never true  Transportation Needs: No Transportation Needs (02/11/2023)   Received from [facility], [facility]   PRAPARE - Transportation    Lack of Transportation (Medical): No    Lack of Transportation (Non-Medical): No  Physical Activity: Not on file  Stress: Not on file  Social Connections: Unknown (02/27/2022)   Received from [facility], [facility]   Social Network    Social Network: Not on file     Family History: The patient's family history includes Heart failure (age of onset: 60) in his mother.  ROS:   Please see the history of present illness.     All other systems reviewed and are negative.  EKGs/Labs/Other Studies Reviewed:    The following studies were reviewed today: CLINICAL DATA:  Sarcoidosis. Restrictive lung disease. Evaluate for interstitial lung disease.   EXAM: CT CHEST WITHOUT CONTRAST    TECHNIQUE: Multidetector CT imaging of the chest was performed following the standard protocol without intravenous contrast. High resolution imaging of the lungs, as well as inspiratory and expiratory imaging, was performed.   RADIATION DOSE REDUCTION: This exam was performed according to the departmental dose-optimization program which includes automated exposure control, adjustment of the mA and/or kV according to patient size and/or use of iterative reconstruction technique.   COMPARISON:  04/03/2022 chest radiograph.   FINDINGS: Cardiovascular: Normal heart size. No significant pericardial effusion/thickening. Three-vessel coronary atherosclerosis. Mildly atherosclerotic nonaneurysmal thoracic aorta. Normal caliber pulmonary arteries.   Mediastinum/Nodes: No discrete thyroid nodules. Unremarkable esophagus. No axillary adenopathy. Shotty nonenlarged paratracheal and prevascular  mediastinal nodes. No pathologically enlarged mediastinal or discrete hilar nodes on these noncontrast images.   Lungs/Pleura: No pneumothorax. No pleural effusion. Several scattered small solid peribronchovascular the perifissural and subpleural pulmonary nodules in both lungs, for example a 0.5 cm right upper lobe peribronchovascular nodule (series 6/image 65) and a 0.5 cm left major fissure nodule (series 6/image 69). No acute consolidative airspace disease or lung masses. No significant regions of subpleural reticulation, ground-glass opacity, traction bronchiectasis, architectural distortion or frank honeycombing. No significant lobular air trapping or evidence of tracheobronchomalacia on the expiration sequence.   Upper abdomen: Subcentimeter posterior right liver dome hypodense lesion is too small to characterize.   Musculoskeletal: No aggressive appearing focal osseous lesions. Mild thoracic spondylosis.   IMPRESSION: 1. Scattered perilymphatic distribution small solid  pulmonary nodules, largest 0.5 cm, compatible with pulmonary sarcoidosis. 2. No evidence of interstitial lung disease. 3. Three-vessel coronary atherosclerosis. 4. Aortic Atherosclerosis (ICD10-I70.0).     Electronically Signed   By: Delbert Phenix M.D.   On: 06/17/2022 15:38   Myoview 07/25/22: Study Highlights      Findings are consistent with no ischemia. The study is high risk.   No ST deviation was noted.   LV perfusion is normal. There is no evidence of ischemia. There is no evidence of infarction.   Left ventricular function is abnormal. Global function is moderately reduced. Nuclear stress EF: 33 %. The left ventricular ejection fraction is moderately decreased (30-44%). End diastolic cavity size is mildly enlarged. End systolic cavity size is moderately enlarged.   Prior study not available for comparison.   Normal perfusion study, but EF measures at 33% and visually appears hypokinetic. Recommend echo to more accurately evaluate wall motion and function.    Cardiac cath 08/01/22:  RIGHT/LEFT HEART CATH AND CORONARY ANGIOGRAPHY   Conclusion      Mid LAD lesion is 70% stenosed.   1st Diag lesion is 90% stenosed.   2nd Diag lesion is 90% stenosed.   Prox Cx to Mid Cx lesion is 75% stenosed.   Prox RCA lesion is 40% stenosed.   LV end diastolic pressure is normal.   2 vessel obstructive CAD. There is severe disease in 2 small diagonal vessels. Moderate disease in the mid LAD and mid LCx.  Normal LV filling pressures. LVEDP 12 mm Hg. PCWP 12/10 with mean 10 mm Hg. Normal right heart pressures. PAP 22/10 with mean 16 mm Hg Normal cardiac output 7.1 L/min with index 3.33   Plan: will obtain an Echo to better assess LV function. I don't think his degree of CAD can explain that low EF. If low EF is confirmed on Echo will optimize CHF therapy. The patient has no active angina and perfusion on Myoview is Ok so I would not pursue revascularization at this point.   Coronary  Diagrams  Diagnostic Dominance: Right  Intervention  Echo 08/14/22: IMPRESSIONS     1. Left ventricular ejection fraction, by estimation, is 60 to 65%. The  left ventricle has normal function. The left ventricle has no regional  wall motion abnormalities. Left ventricular diastolic parameters were  normal.   2. Right ventricular systolic function is normal. The right ventricular  size is normal.   3. Interatrial septum is hypermobile No PFO seen by color doppler.   4. The mitral valve is normal in structure. Mild mitral valve  regurgitation.   5. The aortic valve is normal in structure. Aortic valve regurgitation is  not visualized.   6. The inferior  vena cava is normal in size with greater than 50%  respiratory variability, suggesting right atrial pressure of 3 mmHg.   EKG Interpretation Date/Time:  Wednesday August 20 2023 09:31:34 EDT Ventricular Rate:  75 PR Interval:  160 QRS Duration:  94 QT Interval:  378 QTC Calculation: 422 R Axis:   -42  Text Interpretation: Normal sinus rhythm Left axis deviation Nonspecific T wave abnormality When compared with ECG of 01-Jan-2021 11:36, No significant change since last tracing Confirmed by [place], Polk Minor [phone]) on 08/20/2023 9:37:58 AM    Recent Labs: No results found for requested labs within last 365 days.  Recent Lipid Panel No results found for: "CHOL", "TRIG", "HDL", "CHOLHDL", "VLDL", "LDLCALC", "LDLDIRECT"   Risk Assessment/Calculations:                Physical Exam:    VS:  BP 116/74   Pulse 75   Ht 5\' 10"  (1.778 m)   Wt 210 lb 6.4 oz (95.4 kg)   SpO2 95%   BMI 30.19 kg/m     Wt Readings from Last 3 Encounters:  08/20/23 210 lb 6.4 oz (95.4 kg)  06/17/23 211 lb 3.2 oz (95.8 kg)  01/07/23 222 lb 7.1 oz (100.9 kg)     GEN:  Well nourished, well developed in no acute distress HEENT: Normal NECK: No JVD; No carotid bruits LYMPHATICS: No lymphadenopathy CARDIAC: RRR, no murmurs, rubs,  gallops RESPIRATORY:  Clear to auscultation without rales, wheezing or rhonchi  ABDOMEN: Soft, non-tender, non-distended MUSCULOSKELETAL:  No edema; No deformity  SKIN: Warm and dry NEUROLOGIC:  Alert and oriented x 3 PSYCHIATRIC:  Normal affect   ASSESSMENT:    1. Coronary artery calcification   2. Coronary artery disease of native artery of native heart with stable angina pectoris (HCC)   3. Cardiomyopathy, unspecified type (HCC)      PLAN:    In order of problems listed above:  CAD. Multiple coronary risk factors.   Myoview was high risk due to low EF but showed normal perfusion. Cath showed severe disease in small diagonal branches. Moderate LAD and LCx disease. Given lack of ischemia on stress test and no angina medical therapy recommended. Stressed importance of risk factor modification.  Low EF noted on Myoview study but by Echo EF is normal. No additional therapy needed.  DM on oral therapy. Intolerance of metformin. Now on Jardiance and Ozempic. Reports A1c at [facility] 6.8% HLD. On statin. Also reports recent labs good with [facility] HTN. Currently on amlodipine with good control  OSA on CPAP Sarcoidosis. Per pulmonary. No overt evidence of cardiac sarcoid. On Dulera for reactive airway disease.  History of CVA on ASA     Shared Decision Making/Informed Consent The risks [chest pain, shortness of breath, cardiac arrhythmias, dizziness, blood pressure fluctuations, myocardial infarction, stroke/transient ischemic attack, nausea, vomiting, allergic reaction, radiation exposure, metallic taste sensation and life-threatening complications (estimated to be 1 in 10,000)], benefits (risk stratification, diagnosing coronary artery disease, treatment guidance) and alternatives of a nuclear stress test were discussed in detail with Mr. Ballek and he agrees to proceed.    Medication Adjustments/Labs and Tests Ordered: Current medicines are reviewed at length with the patient today.  Concerns  regarding medicines are outlined above.  Orders Placed This Encounter  Procedures   EKG 12-Lead   No orders of the defined types were placed in this encounter.   Patient Instructions  Medication Instructions:   *If you need a refill on your cardiac medications before  your next appointment, please call your pharmacy*   Lab Work:  If you have labs (blood work) drawn today and your tests are completely normal, you will receive your results only by: MyChart Message (if you have MyChart) OR A paper copy in the mail If you have any lab test that is abnormal or we need to change your treatment, we will call you to review the results.   Testing/Procedures:    Follow-Up: At [facility], you and your health needs are our priority.  As part of our continuing mission to provide you with exceptional heart care, we have created designated Provider Care Teams.  These Care Teams include your primary Cardiologist (physician) and Advanced Practice Providers (APPs -  Physician Assistants and Nurse Practitioners) who all work together to provide you with the care you need, when you need it.  We recommend signing up for the patient portal called "MyChart".  Sign up information is provided on this After Visit Summary.  MyChart is used to connect with patients for Virtual Visits (Telemedicine).  Patients are able to view lab/test results, encounter notes, upcoming appointments, etc.  Non-urgent messages can be sent to your provider as well.   To learn more about what you can do with MyChart, go to [email or web address].    Your next appointment:      Provider:       Other Instructions      Plan follow up in  one year  Signed, Abdimalik Mayorquin [place], MD  08/20/2023 9:41 AM     HeartCare

## 2023-08-18 DIAGNOSIS — F332 Major depressive disorder, recurrent severe without psychotic features: Secondary | ICD-10-CM | POA: Diagnosis not present

## 2023-08-19 DIAGNOSIS — K036 Deposits [accretions] on teeth: Secondary | ICD-10-CM | POA: Diagnosis not present

## 2023-08-19 DIAGNOSIS — M25511 Pain in right shoulder: Secondary | ICD-10-CM | POA: Diagnosis not present

## 2023-08-20 ENCOUNTER — Encounter: Payer: Self-pay | Admitting: Cardiology

## 2023-08-20 ENCOUNTER — Ambulatory Visit: Payer: No Typology Code available for payment source | Attending: Cardiology | Admitting: Cardiology

## 2023-08-20 VITALS — BP 116/74 | HR 75 | Ht 70.0 in | Wt 210.4 lb

## 2023-08-20 DIAGNOSIS — E785 Hyperlipidemia, unspecified: Secondary | ICD-10-CM | POA: Diagnosis not present

## 2023-08-20 DIAGNOSIS — I25118 Atherosclerotic heart disease of native coronary artery with other forms of angina pectoris: Secondary | ICD-10-CM | POA: Diagnosis not present

## 2023-08-20 DIAGNOSIS — E119 Type 2 diabetes mellitus without complications: Secondary | ICD-10-CM

## 2023-08-20 DIAGNOSIS — I2510 Atherosclerotic heart disease of native coronary artery without angina pectoris: Secondary | ICD-10-CM | POA: Diagnosis not present

## 2023-08-20 NOTE — Patient Instructions (Addendum)
Medication Instructions:  Continue all current medications If you need a refill on your cardiac medications before your next appointment, please call your pharmacy*   Lab Work: none mal or we need to change your treatment, we will call you to review the results.   Testing/Procedures: none   Follow-Up: At [facility], you and your health needs are our priority.  As part of our continuing mission to provide you with exceptional heart care, we have created designated Provider Care Teams.  These Care Teams include your primary Cardiologist (physician) and Advanced Practice Providers (APPs -  Physician Assistants and Nurse Practitioners) who all work together to provide you with the care you need, when you need it.   Your next appointment:   1 year.  Please call office in April 2025 to schedule appt for Oct 2025    Provider:   Dr. Swaziland

## 2023-09-22 DIAGNOSIS — M25511 Pain in right shoulder: Secondary | ICD-10-CM | POA: Diagnosis not present

## 2023-10-19 DIAGNOSIS — K036 Deposits [accretions] on teeth: Secondary | ICD-10-CM | POA: Diagnosis not present

## 2023-11-14 DIAGNOSIS — K08409 Partial loss of teeth, unspecified cause, unspecified class: Secondary | ICD-10-CM | POA: Diagnosis not present

## 2023-11-24 DIAGNOSIS — M25511 Pain in right shoulder: Secondary | ICD-10-CM | POA: Diagnosis not present

## 2023-12-03 DIAGNOSIS — K08409 Partial loss of teeth, unspecified cause, unspecified class: Secondary | ICD-10-CM | POA: Diagnosis not present

## 2023-12-31 DIAGNOSIS — D860 Sarcoidosis of lung: Secondary | ICD-10-CM | POA: Diagnosis not present

## 2023-12-31 DIAGNOSIS — I2510 Atherosclerotic heart disease of native coronary artery without angina pectoris: Secondary | ICD-10-CM | POA: Diagnosis not present

## 2023-12-31 DIAGNOSIS — N183 Chronic kidney disease, stage 3 unspecified: Secondary | ICD-10-CM | POA: Diagnosis not present

## 2023-12-31 DIAGNOSIS — E1122 Type 2 diabetes mellitus with diabetic chronic kidney disease: Secondary | ICD-10-CM | POA: Diagnosis not present

## 2023-12-31 DIAGNOSIS — I129 Hypertensive chronic kidney disease with stage 1 through stage 4 chronic kidney disease, or unspecified chronic kidney disease: Secondary | ICD-10-CM | POA: Diagnosis not present

## 2024-01-30 DIAGNOSIS — Z131 Encounter for screening for diabetes mellitus: Secondary | ICD-10-CM | POA: Diagnosis not present

## 2024-02-23 DIAGNOSIS — F4312 Post-traumatic stress disorder, chronic: Secondary | ICD-10-CM | POA: Diagnosis not present

## 2024-02-23 DIAGNOSIS — F332 Major depressive disorder, recurrent severe without psychotic features: Secondary | ICD-10-CM | POA: Diagnosis not present

## 2024-02-27 ENCOUNTER — Other Ambulatory Visit: Payer: Self-pay

## 2024-02-27 ENCOUNTER — Observation Stay (HOSPITAL_COMMUNITY)
Admission: EM | Admit: 2024-02-27 | Discharge: 2024-02-29 | Disposition: A | Discharge status: Home / Self Care | Attending: Internal Medicine | Admitting: Internal Medicine

## 2024-02-27 ENCOUNTER — Emergency Department (HOSPITAL_COMMUNITY)

## 2024-02-27 DIAGNOSIS — Z7902 Long term (current) use of antithrombotics/antiplatelets: Secondary | ICD-10-CM | POA: Diagnosis not present

## 2024-02-27 DIAGNOSIS — Z91040 Latex allergy status: Secondary | ICD-10-CM | POA: Diagnosis not present

## 2024-02-27 DIAGNOSIS — E1122 Type 2 diabetes mellitus with diabetic chronic kidney disease: Secondary | ICD-10-CM | POA: Diagnosis not present

## 2024-02-27 DIAGNOSIS — E785 Hyperlipidemia, unspecified: Secondary | ICD-10-CM | POA: Diagnosis not present

## 2024-02-27 DIAGNOSIS — J45909 Unspecified asthma, uncomplicated: Secondary | ICD-10-CM | POA: Diagnosis not present

## 2024-02-27 DIAGNOSIS — Z7982 Long term (current) use of aspirin: Secondary | ICD-10-CM | POA: Insufficient documentation

## 2024-02-27 DIAGNOSIS — I6389 Other cerebral infarction: Principal | ICD-10-CM | POA: Insufficient documentation

## 2024-02-27 DIAGNOSIS — Z79899 Other long term (current) drug therapy: Secondary | ICD-10-CM | POA: Insufficient documentation

## 2024-02-27 DIAGNOSIS — I129 Hypertensive chronic kidney disease with stage 1 through stage 4 chronic kidney disease, or unspecified chronic kidney disease: Secondary | ICD-10-CM | POA: Diagnosis not present

## 2024-02-27 DIAGNOSIS — R29700 NIHSS score 0: Secondary | ICD-10-CM

## 2024-02-27 DIAGNOSIS — Z7985 Long-term (current) use of injectable non-insulin antidiabetic drugs: Secondary | ICD-10-CM | POA: Diagnosis not present

## 2024-02-27 DIAGNOSIS — D869 Sarcoidosis, unspecified: Secondary | ICD-10-CM | POA: Insufficient documentation

## 2024-02-27 DIAGNOSIS — N1831 Chronic kidney disease, stage 3a: Secondary | ICD-10-CM | POA: Diagnosis not present

## 2024-02-27 DIAGNOSIS — I639 Cerebral infarction, unspecified: Secondary | ICD-10-CM | POA: Diagnosis not present

## 2024-02-27 DIAGNOSIS — Z96653 Presence of artificial knee joint, bilateral: Secondary | ICD-10-CM | POA: Diagnosis not present

## 2024-02-27 DIAGNOSIS — R531 Weakness: Secondary | ICD-10-CM | POA: Diagnosis present

## 2024-02-27 DIAGNOSIS — Z8679 Personal history of other diseases of the circulatory system: Secondary | ICD-10-CM | POA: Diagnosis not present

## 2024-02-27 DIAGNOSIS — I2510 Atherosclerotic heart disease of native coronary artery without angina pectoris: Secondary | ICD-10-CM | POA: Insufficient documentation

## 2024-02-27 DIAGNOSIS — K219 Gastro-esophageal reflux disease without esophagitis: Secondary | ICD-10-CM | POA: Diagnosis present

## 2024-02-27 LAB — ETHANOL: Alcohol, Ethyl (B): <15 mg/dL (ref <15)

## 2024-02-27 LAB — COMPREHENSIVE METABOLIC PANEL WITH GFR
ALT: 29 U/L (ref 0–44)
AST: 25 U/L (ref 15–41)
Albumin: 4 g/dL (ref 3.5–5.0)
Alkaline Phosphatase: 86 U/L (ref 38–126)
Anion gap: 9 (ref 5–15)
BUN: 11 mg/dL (ref 8–23)
CO2: 25 mmol/L (ref 22–32)
Calcium: 9.4 mg/dL (ref 8.9–10.3)
Chloride: 105 mmol/L (ref 98–111)
Creatinine, Ser: 1.47 mg/dL — ABNORMAL HIGH (ref 0.61–1.24)
GFR, Estimated: 53 mL/min — ABNORMAL LOW (ref >60)
Glucose, Bld: 237 mg/dL — ABNORMAL HIGH (ref 70–99)
Potassium: 3.8 mmol/L (ref 3.5–5.1)
Sodium: 139 mmol/L (ref 135–145)
Total Bilirubin: 0.7 mg/dL (ref 0.0–1.2)
Total Protein: 7.1 g/dL (ref 6.5–8.1)

## 2024-02-27 LAB — CBC
HCT: 40.2 % (ref 39.0–52.0)
Hemoglobin: 13.1 g/dL (ref 13.0–17.0)
MCH: 30 pg (ref 26.0–34.0)
MCHC: 32.6 g/dL (ref 30.0–36.0)
MCV: 92.2 fL (ref 80.0–100.0)
Platelets: 175 10*3/uL (ref 150–400)
RBC: 4.36 MIL/uL (ref 4.22–5.81)
RDW: 13.3 % (ref 11.5–15.5)
WBC: 5.9 10*3/uL (ref 4.0–10.5)
nRBC: 0 % (ref 0.0–0.2)

## 2024-02-27 LAB — DIFFERENTIAL
Abs Immature Granulocytes: 0.01 10*3/uL (ref 0.00–0.07)
Basophils Absolute: 0.1 10*3/uL (ref 0.0–0.1)
Basophils Relative: 1 %
Eosinophils Absolute: 0.3 10*3/uL (ref 0.0–0.5)
Eosinophils Relative: 5 %
Immature Granulocytes: 0 %
Lymphocytes Relative: 37 %
Lymphs Abs: 2.2 10*3/uL (ref 0.7–4.0)
Monocytes Absolute: 0.4 10*3/uL (ref 0.1–1.0)
Monocytes Relative: 7 %
Neutro Abs: 3 10*3/uL (ref 1.7–7.7)
Neutrophils Relative %: 50 %

## 2024-02-27 LAB — HEMOGLOBIN A1C
Hgb A1c MFr Bld: 7 % — ABNORMAL HIGH (ref 4.8–5.6)
Mean Plasma Glucose: 154.2 mg/dL

## 2024-02-27 LAB — PROTIME-INR
INR: 1 (ref 0.8–1.2)
Prothrombin Time: 13.8 s (ref 11.4–15.2)

## 2024-02-27 LAB — APTT: aPTT: 29 s (ref 24–36)

## 2024-02-27 LAB — CBG MONITORING, ED: Glucose-Capillary: 251 mg/dL — ABNORMAL HIGH (ref 70–99)

## 2024-02-27 MED ORDER — DULOXETINE HCL 60 MG PO CPEP
60.0000 mg | ORAL_CAPSULE | Freq: Every morning | ORAL | Status: DC
  Administered 2024-02-28: 60 mg via ORAL
  Filled 2024-02-27: qty 1
  Filled 2024-02-27: qty 2

## 2024-02-27 MED ORDER — EZETIMIBE 10 MG PO TABS
10.0000 mg | ORAL_TABLET | Freq: Every day | ORAL | Status: DC
  Administered 2024-02-28 – 2024-02-29 (×2): 10 mg via ORAL
  Filled 2024-02-27 (×2): qty 1

## 2024-02-27 MED ORDER — EMPAGLIFLOZIN 10 MG PO TABS
10.0000 mg | ORAL_TABLET | Freq: Every day | ORAL | Status: DC
  Administered 2024-02-28 – 2024-02-29 (×2): 10 mg via ORAL
  Filled 2024-02-27 (×2): qty 1

## 2024-02-27 MED ORDER — DIAZEPAM 5 MG PO TABS
5.0000 mg | ORAL_TABLET | Freq: Every day | ORAL | Status: DC | PRN
  Administered 2024-02-28: 5 mg via ORAL
  Filled 2024-02-27: qty 1

## 2024-02-27 MED ORDER — INSULIN ASPART 100 UNIT/ML IJ SOLN
0.0000 [IU] | Freq: Three times a day (TID) | INTRAMUSCULAR | Status: DC
  Administered 2024-02-28 (×2): 3 [IU] via SUBCUTANEOUS
  Administered 2024-02-28 – 2024-02-29 (×2): 2 [IU] via SUBCUTANEOUS

## 2024-02-27 MED ORDER — DULOXETINE HCL 30 MG PO CPEP
30.0000 mg | ORAL_CAPSULE | Freq: Every day | ORAL | Status: DC
Start: 2024-02-27 — End: 2024-02-29
  Administered 2024-02-27 – 2024-02-28 (×2): 30 mg via ORAL
  Filled 2024-02-27 (×2): qty 1

## 2024-02-27 MED ORDER — LORAZEPAM 2 MG/ML IJ SOLN
2.0000 mg | Freq: Once | INTRAMUSCULAR | Status: AC
  Administered 2024-02-27: 2 mg via INTRAMUSCULAR
  Filled 2024-02-27: qty 1

## 2024-02-27 MED ORDER — LORAZEPAM 2 MG/ML IJ SOLN: 2.0000 mg | Freq: Once | INTRAMUSCULAR | Status: DC | PRN

## 2024-02-27 MED ORDER — ASPIRIN 81 MG PO TBEC
81.0000 mg | DELAYED_RELEASE_TABLET | Freq: Every day | ORAL | Status: DC
  Administered 2024-02-28: 81 mg via ORAL
  Filled 2024-02-27 (×2): qty 1

## 2024-02-27 MED ORDER — LORAZEPAM 0.5 MG PO TABS: 0.5000 mg | ORAL_TABLET | Freq: Four times a day (QID) | ORAL | Status: DC | PRN

## 2024-02-27 MED ORDER — INSULIN ASPART 100 UNIT/ML IJ SOLN
0.0000 [IU] | Freq: Every day | INTRAMUSCULAR | Status: DC
  Administered 2024-02-27: 3 [IU] via SUBCUTANEOUS

## 2024-02-27 MED ORDER — SEMAGLUTIDE(0.25 OR 0.5MG/DOS) 2 MG/3ML ~~LOC~~ SOPN: 0.5000 mg | PEN_INJECTOR | SUBCUTANEOUS | Status: DC

## 2024-02-27 MED ORDER — LORAZEPAM 2 MG/ML IJ SOLN: 0.5000 mg | Freq: Four times a day (QID) | INTRAMUSCULAR | Status: DC | PRN

## 2024-02-27 MED ORDER — ESCITALOPRAM OXALATE 10 MG PO TABS
10.0000 mg | ORAL_TABLET | Freq: Every day | ORAL | Status: DC
  Administered 2024-02-27 – 2024-02-28 (×2): 10 mg via ORAL
  Filled 2024-02-27 (×2): qty 1

## 2024-02-27 MED ORDER — ATORVASTATIN CALCIUM 80 MG PO TABS
80.0000 mg | ORAL_TABLET | Freq: Every day | ORAL | Status: DC
  Administered 2024-02-28 – 2024-02-29 (×2): 80 mg via ORAL
  Filled 2024-02-27 (×2): qty 1

## 2024-02-27 MED ORDER — VITAMIN D (ERGOCALCIFEROL) 1.25 MG (50000 UNIT) PO CAPS: 50000.0000 [IU] | ORAL_CAPSULE | ORAL | Status: DC

## 2024-02-27 NOTE — H&P (Signed)
 History and Physical    Patient: Brendan [name]:[id number] DOB: 1961/06/30 DOA: 02/27/2024 DOS: the patient was seen and examined on 02/27/2024 PCP: Default, Provider, MD  Patient coming from: Home  Chief Complaint:  Chief Complaint  Patient presents with   Hand Problem   HPI: Brendan [name] is a 63 y.o. male with medical history significant of coronary artery disease, non-insulin-dependent diabetes, hyperlipidemia, essential hypertension, chronic kidney disease stage III, sarcoidosis, previous CVA, history of PFO, who presented to the ER with strokelike symptoms.  Patient was on the phone around 11:00 this morning apparently having a phone call when he could not hold a pencil in his right hand.  He tried but the right hand will not follow commands.  Symptoms persisted for about 15 minutes and then resolved.  He has had a prior stroke apparently in October of last year at that time he was left-sided numbness face and leg.  Today however symptoms have resolved.  Arrived the ER outside the window for tPA.  Had head CT without contrast that was negative but MRI confirmed acute CVA.  Neurology has been consulted and is following.  Patient being admitted to the medical service for evaluation and initiate treatment for acute CVA.  Review of Systems: As mentioned in the history of present illness. All other systems reviewed and are negative. Past Medical History:  Diagnosis Date   Coronary artery disease    Diabetes mellitus without complication (HCC)    Hyperlipidemia    Hypertension    Kidney disease    Sarcoidosis    Stroke [facility])    Past Surgical History:  Procedure Laterality Date   CARDIAC CATHETERIZATION     JOINT REPLACEMENT Bilateral    TKR   RIGHT/LEFT HEART CATH AND CORONARY ANGIOGRAPHY N/A 08/01/2022   Procedure: RIGHT/LEFT HEART CATH AND CORONARY ANGIOGRAPHY;  Surgeon: Swaziland, Peter M, MD;  Location: [facility] INVASIVE CV LAB;  Service: Cardiovascular;  Laterality: N/A;    ROTATOR CUFF REPAIR Left    Social History:  reports that he has never smoked. He has never used smokeless tobacco. He reports that he does not currently use alcohol. He reports that he does not currently use drugs.  Allergies  Allergen Reactions   Latex Swelling   Lisinopril Swelling   Losartan Swelling   Metformin Nausea Only   Sildenafil Palpitations    Other reaction(s): Headache, Palpitation, Headache, Palpitation, Headache, Palpitations    Family History  Problem Relation Age of Onset   Heart failure Mother 74    Prior to Admission medications   Medication Sig Start Date End Date Taking? Authorizing Provider  albuterol (VENTOLIN HFA) 108 (90 Base) MCG/ACT inhaler Inhale 1-2 puffs into the lungs every 6 (six) hours as needed for shortness of breath or wheezing. 04/20/21  Yes [provider]  amLODipine (NORVASC) 10 MG tablet Take 1 tablet by mouth daily. 10/15/22  Yes [provider]  amoxicillin  (AMOXIL ) 500 MG capsule TAKE FOUR CAPSULES BY MOUTH ONCE 1 HOUR PRIOR TO THE DENTAL TREATMENT 07/15/22  Yes Swaziland, Peter M, MD  aspirin  EC 81 MG tablet Take 81 mg by mouth at bedtime. Swallow whole.   Yes [provider]  atorvastatin (LIPITOR) 80 MG tablet Take 80 mg by mouth daily. 09/17/22 02/27/24 Yes [provider]  clobetasol cream (TEMOVATE) 0.05 %  07/29/22  Yes [provider]  diazepam (VALIUM) 5 MG tablet Take 5 mg by mouth daily as needed for anxiety. 12/14/20  Yes [provider]  diclofenac Sodium (VOLTAREN) 1 % GEL Apply 2 g topically as needed (pain). 06/27/23  Yes [provider]  DULoxetine  (CYMBALTA ) 30 MG capsule Take 30-60 mg by mouth 2 (two) times daily. Take 60 mg in the morning and 30 mg at night 08/18/23  Yes [provider]  EPINEPHrine  0.3 mg/0.3 mL IJ SOAJ injection Inject 0.3 mg into the muscle as needed for anaphylaxis. 01/31/22  Yes Coretha Dew, PA-C  escitalopram  (LEXAPRO ) 10 MG tablet  Take 10 mg by mouth at bedtime. 10/05/21  Yes [provider]  ezetimibe (ZETIA) 10 MG tablet Take 10 mg by mouth daily. 04/21/23 02/27/24 Yes [provider]  hydrOXYzine (ATARAX) 25 MG tablet Take 25 mg by mouth 3 (three) times daily.   Yes [provider]  mometasone -formoterol  (DULERA) 100-5 MCG/ACT AERO Inhale 2 puffs into the lungs in the morning and at bedtime. 12/27/22  Yes Parrett, Tammy S, NP  Semaglutide,0.25 or 0.5MG /DOS, 2 MG/3ML SOPN Inject 0.5 mg into the skin once a week. sunday 01/03/23  Yes [provider]  tadalafil (CIALIS) 20 MG tablet Take 20 mg by mouth daily as needed for erectile dysfunction.   Yes [provider]  traZODone (DESYREL) 100 MG tablet Take 100 mg by mouth at bedtime. 12/27/21  Yes [provider]  Vitamin D, Ergocalciferol, (DRISDOL) 1.25 MG (50000 UNIT) CAPS capsule Take 50,000 Units by mouth every 7 (seven) days. monday 07/03/23  Yes [provider]  zolpidem (AMBIEN) 10 MG tablet Take 10 mg by mouth at bedtime. 10/05/21 07/30/24 Yes [provider]  empagliflozin (JARDIANCE) 25 MG TABS tablet Take 0.5 tablets by mouth daily. 11/29/22   [provider]    Physical Exam: Vitals:   02/27/24 1120 02/27/24 1508 02/27/24 2122  BP: 136/74 135/79   Pulse: 92 75   Resp: 17 17   Temp: 97.9 F (36.6 C) 97.8 F (36.6 C) 98.2 F (36.8 C)  TempSrc:   Oral  SpO2: 97% 100%    Constitutional: Acutely ill looking no distress NAD, calm, comfortable Eyes: PERRL, lids and conjunctivae normal ENMT: Mucous membranes are moist. Posterior pharynx clear of any exudate or lesions.Normal dentition.  Neck: normal, supple, no masses, no thyromegaly Respiratory: clear to auscultation bilaterally, no wheezing, no crackles. Normal respiratory effort. No accessory muscle use.  Cardiovascular: Regular rate and rhythm, no murmurs / rubs / gallops. No extremity edema. 2+ pedal pulses. No carotid bruits.  Abdomen: no  tenderness, no masses palpated. No hepatosplenomegaly. Bowel sounds positive.  Musculoskeletal: Good range of motion, no joint swelling or tenderness, Skin: no rashes, lesions, ulcers. No induration Neurologic: CN 2-12 grossly intact. Sensation intact, DTR normal. Strength 5/5 in all 4.  Psychiatric: Normal judgment and insight. Alert and oriented x 3. Normal mood  Data Reviewed:  Afebrile.  Blood pressure 130/74, pulse 92 history of 17 oxygen sat 97% on room air.  Glucose is 237 creatinine 1.47 otherwise the rest of the lab results appear to be within normal.  EKG showed normal sinus rhythm.  Head CT without contrast showed no acute intracranial abnormality.  MRI of the brain showed 5 mm acute nonhemorrhagic infarct of the left precentral gyrus that correspond to the right hand.  Also periventricular and scattered subcortical T2 hyperintensities bilaterally advanced for age.  Remote lacunar infarcts of the right thalamus remote cortical infarct in the medial right occipital lobe  Assessment and Plan:  #1 acute nonhemorrhagic infarct: Patient will be admitted for evaluation and  treatment.  Neurology already consulted.  Will  admit patient to telemetry bed.  Initiate dual antiplatelet therapy with aspirin  and Plavix.  Follow CTA head and neck.  Echocardiogram, A1c, lipid panel.  Will consult PT OT and speech therapy.  Initial permissive hypertension.  The goal for his LDL is less than 70.  #2 diabetes: Initiate sliding scale insulin.  #3 hyperlipidemia: Continue high intensity statin statins  #4 essential hypertension: Continue with blood pressure monitoring  #5 chronic kidney disease stage II: Monitor renal function closely.  #6 history of coronary artery disease: Patient to remain on aspirin  also Plavix.  Asymptomatic at this point  #7 sarcoidosis: Defer to outpatient treatment.    Advance Care Planning:   Code Status: Prior full code  Consults: Dr. Bonnita Buttner  Family Communication: No  family at bedside  Severity of Illness: The appropriate patient status for this patient is INPATIENT. Inpatient status is judged to be reasonable and necessary in order to provide the required intensity of service to ensure the patient's safety. The patient's presenting symptoms, physical exam findings, and initial radiographic and laboratory data in the context of their chronic comorbidities is felt to place them at high risk for further clinical deterioration. Furthermore, it is not anticipated that the patient will be medically stable for discharge from the hospital within 2 midnights of admission.   * I certify that at the point of admission it is my clinical judgment that the patient will require inpatient hospital care spanning beyond 2 midnights from the point of admission due to high intensity of service, high risk for further deterioration and high frequency of surveillance required.*  AuthorCarolin Chyle, MD 02/27/2024 10:35 PM  For on call review [email or web address].

## 2024-02-27 NOTE — ED Provider Notes (Signed)
 [facility] Provider Note   CSN: [id number] Arrival date & time: 02/27/24  1115     History  Chief Complaint  Patient presents with   Hand Problem    Brendan [name] is a 63 y.o. male history of hypertension, previous strokes with no residual deficits here presenting with right arm weakness.  Patient states that around 11 AM, he was on the phone with insurance company and he was told that he needs to write something.  He states that suddenly, he was unable to grip the pen.  He also noticed some muscle spasms of the right hand.  Lasted 15 minutes and resolved.  Denies any trouble with speech or weakness.  Patient has 2 previous strokes and had left arm and face numbness during his strokes but never has right-sided symptoms.  Patient took 1 baby aspirin .  He is not currently on blood thinners.  HPI     Home Medications Prior to Admission medications   Medication Sig Start Date End Date Taking? Authorizing Provider  albuterol (VENTOLIN HFA) 108 (90 Base) MCG/ACT inhaler Inhale 1-2 puffs into the lungs every 6 (six) hours as needed for shortness of breath or wheezing. 04/20/21   [provider]  amLODipine (NORVASC) 10 MG tablet Take 1 tablet by mouth daily. 10/15/22   [provider]  amoxicillin  (AMOXIL ) 500 MG capsule TAKE FOUR CAPSULES BY MOUTH ONCE 1 HOUR PRIOR TO THE DENTAL TREATMENT 07/15/22   Swaziland, Peter M, MD  aspirin  EC 81 MG tablet Take 81 mg by mouth at bedtime. Swallow whole.    [provider]  atorvastatin (LIPITOR) 80 MG tablet Take 80 mg by mouth daily. 09/17/22 09/18/23  [provider]  clobetasol cream (TEMOVATE) 0.05 %  07/29/22   [provider]  diazepam (VALIUM) 5 MG tablet Take 5 mg by mouth daily as needed for anxiety. 12/14/20   [provider]  diclofenac Sodium (VOLTAREN) 1 % GEL Apply 2 g topically as needed. 06/27/23   [provider]  DULoxetine   (CYMBALTA ) 30 MG capsule Take 30 mg by mouth 2 (two) times daily. 08/18/23   [provider]  empagliflozin (JARDIANCE) 25 MG TABS tablet Take 0.5 tablets by mouth daily. 11/29/22   [provider]  EPINEPHrine  0.3 mg/0.3 mL IJ SOAJ injection Inject 0.3 mg into the muscle as needed for anaphylaxis. 01/31/22   Coretha Dew, PA-C  escitalopram  (LEXAPRO ) 10 MG tablet Take 10 mg by mouth at bedtime. 10/05/21   [provider]  ezetimibe (ZETIA) 10 MG tablet Take 10 mg by mouth daily. 04/21/23 11/30/23  [provider]  hydrOXYzine (ATARAX) 25 MG tablet Take 25 mg by mouth 3 (three) times daily.    [provider]  mometasone -formoterol  (DULERA) 100-5 MCG/ACT AERO Inhale 2 puffs into the lungs in the morning and at bedtime. 12/27/22   Parrett, Macdonald Savoy, NP  Semaglutide,0.25 or 0.5MG /DOS, 2 MG/3ML SOPN Inject 0.5 mg into the skin once a week. 01/03/23   [provider]  traZODone (DESYREL) 100 MG tablet Take 100 mg by mouth at bedtime. 12/27/21   [provider]  Vitamin D, Ergocalciferol, (DRISDOL) 1.25 MG (50000 UNIT) CAPS capsule Take 50,000 Units by mouth every 7 (seven) days. 07/03/23   [provider]  zolpidem (AMBIEN) 10 MG tablet Take 10 mg by mouth at bedtime. 10/05/21 07/30/24  [provider]      Allergies    Latex, Lisinopril, Losartan,  Metformin, and Sildenafil    Review of Systems   Review of Systems  Physical Exam Updated Vital Signs BP 135/79 (BP Location: Left Arm)   Pulse 75   Temp 97.8 F (36.6 C)   Resp 17   SpO2 100%  Physical Exam Vitals and nursing note reviewed.  Constitutional:      Appearance: Normal appearance.  HENT:     Head: Normocephalic.     Nose: Nose normal.     Mouth/Throat:     Mouth: Mucous membranes are moist.  Eyes:     Extraocular Movements: Extraocular movements intact.     Pupils: Pupils are equal, round, and reactive to light.  Cardiovascular:     Rate and Rhythm: Normal  rate and regular rhythm.     Pulses: Normal pulses.     Heart sounds: Normal heart sounds.  Pulmonary:     Effort: Pulmonary effort is normal.     Breath sounds: Normal breath sounds.  Abdominal:     General: Abdomen is flat.     Palpations: Abdomen is soft.  Musculoskeletal:        General: Normal range of motion.     Cervical back: Normal range of motion and neck supple.  Skin:    General: Skin is warm.     Capillary Refill: Capillary refill takes less than 2 seconds.  Neurological:     Mental Status: He is alert.     Comments: Slightly dec sensation R 1st and 2nd fingers.  Normal hand grasp bilaterally.  Normal finger-to-nose bilaterally.  Normal sensation bilateral arms and legs  Psychiatric:        Mood and Affect: Mood normal.        Behavior: Behavior normal.     ED Results / Procedures / Treatments   Labs (all labs ordered are listed, but only abnormal results are displayed) Labs Reviewed  COMPREHENSIVE METABOLIC PANEL WITH GFR - Abnormal; Notable for the following components:      Result Value   Glucose, Bld 237 (*)    Creatinine, Ser 1.47 (*)    GFR, Estimated 53 (*)    All other components within normal limits  PROTIME-INR  APTT  CBC  DIFFERENTIAL  ETHANOL  I-STAT CHEM 8, ED    EKG None  Radiology MR BRAIN WO CONTRAST Result Date: 02/27/2024 CLINICAL DATA:  Neuro deficit, acute, stroke suspected. Mental status change. Episode in which he was unable to use his right hand for approximately 15 minutes today. Symptoms have since resolved. EXAM: MRI HEAD WITHOUT CONTRAST TECHNIQUE: Multiplanar, multiecho pulse sequences of the brain and surrounding structures were obtained without intravenous contrast. COMPARISON:  CT head without contrast 02/27/2024. MR head without contrast 01/01/2021 FINDINGS: Brain: No acute infarct, hemorrhage, a 5 mm acute nonhemorrhagic infarct is present in the left precentral gyrus corresponding to the right hand. Focal T2 and FLAIR signal  is associated. Periventricular and scattered subcortical T2 hyperintensities bilaterally are moderately advanced for age, slightly progressed since the prior exam. Remote lacunar infarcts are again noted within the right thalamus. A remote cortical infarct is present in the medial right occipital lobe. The ventricles are of normal size. No significant extraaxial fluid collection is present. The brainstem and cerebellum are within normal limits. Midline structures are within normal limits. Vascular: Flow is present in the major intracranial arteries. Skull and upper cervical spine: The craniocervical junction is normal. Upper cervical spine is within normal limits. Marrow signal is unremarkable. Sinuses/Orbits: The paranasal sinuses and  mastoid air cells are clear. The globes and orbits are within normal limits. IMPRESSION: 1. 5 mm acute nonhemorrhagic infarct of the left precentral gyrus corresponding to the right hand. 2. Periventricular and scattered subcortical T2 hyperintensities bilaterally are moderately advanced for age, slightly progressed since the prior exam. The finding is nonspecific but can be seen in the setting of chronic microvascular ischemia, a demyelinating process such as multiple sclerosis, vasculitis, complicated migraine headaches, or as the sequelae of a prior infectious or inflammatory process. 3. Remote lacunar infarcts of the right thalamus. 4. Remote cortical infarct of the medial right occipital lobe. These results were called by telephone at the time of interpretation on 02/27/2024 at 7:01 pm to provider Demri Poulton , who verbally acknowledged these results. Electronically Signed   By: Audree Leas M.D.   On: 02/27/2024 19:21   MR Cervical Spine Wo Contrast Result Date: 02/27/2024 CLINICAL DATA:  Myelopathy, acute, cervical spine. Right arm numbness. EXAM: MRI CERVICAL SPINE WITHOUT CONTRAST TECHNIQUE: Multiplanar, multisequence MR imaging of the cervical spine was performed. No  intravenous contrast was administered. COMPARISON:  None Available. FINDINGS: Alignment: No significant listhesis is present. Straightening of the normal cervical lordosis is present. Vertebrae: Marrow signal and vertebral body heights are normal. Cord: Normal signal and morphology. Posterior Fossa, vertebral arteries, paraspinal tissues: Craniocervical junction is normal. Flow is present in the vertebral arteries bilaterally. Visualized intracranial contents are normal. Disc levels: C2-3: Uncovertebral spurring is present bilaterally without significant stenosis. C3-4: A broad-based disc osteophyte complex effaces the ventral CSF. Moderate central and bilateral foraminal stenosis is present. Facet hypertrophy is worse on the left. C4-5: A broad-based disc osteophyte complex present. Uncovertebral and facet disease worse on the right. Moderate central canal stenosis is present. Severe right and moderate left foraminal stenosis is present. C5-6: A broad-based disc osteophyte complex is present. Partial effacement of ventral CSF is noted. Uncovertebral spurring contributes to moderate foraminal stenosis bilaterally. C6-7: A broad-based disc osteophyte complex is present. Central canal is patent. Uncovertebral spurring contributes to moderate foraminal stenosis bilaterally. C7-T1: Negative. IMPRESSION: 1. Multilevel spondylosis of the cervical spine as described. 2. Moderate central canal stenosis at C3-4 and C4-5. 3. Severe right and moderate left foraminal stenosis at C4-5. 4. Moderate foraminal stenosis bilaterally at C5-6 and C6-7. Electronically Signed   By: Audree Leas M.D.   On: 02/27/2024 19:06   CT Head Wo Contrast Result Date: 02/27/2024 CLINICAL DATA:  Mental status change, unknown cause EXAM: CT HEAD WITHOUT CONTRAST TECHNIQUE: Contiguous axial images were obtained from the base of the skull through the vertex without intravenous contrast. RADIATION DOSE REDUCTION: This exam was performed  according to the departmental dose-optimization program which includes automated exposure control, adjustment of the mA and/or kV according to patient size and/or use of iterative reconstruction technique. COMPARISON:  January 01, 2021 FINDINGS: Brain: The ventricles appear age appropriate. No mass effect or midline shift. Scattered periventricular white matter hypoattenuation, most consistent with changes of mild chronic ischemic microvascular disease. Small chronic lacunar infarcts in the right thalamus. no evidence of acute territorial infarction, extra-axial fluid collection, hemorrhage, or mass lesion. The basilar cisterns are patent without downward herniation. The cerebellar hemispheres and vermis are well formed without mass lesion or focal attenuation abnormality. No Vascular: No hyperdense vessel. Calcified atherosclerotic plaque within the cavernous/supraclinoid ICA and intradural vertebral arteries. Skull: Normal. Negative for fracture or focal lesion. Sinuses/Orbits: The paranasal sinuses and mastoids are clear. The globes appear intact. No retrobulbar hematoma. Other: None. IMPRESSION:  No acute intracranial abnormality, specifically, no acute hemorrhage, territorial infarction, or intracranial mass. Electronically Signed   By: Rance Burrows M.D.   On: 02/27/2024 14:05    Procedures Procedures    Medications Ordered in ED Medications  LORazepam  (ATIVAN ) injection 2 mg (2 mg Intramuscular Given 02/27/24 1808)    ED Course/ Medical Decision Making/ A&P                                 Medical Decision Making JMICHAEL MINER is a 63 y.o. male here presenting with right hand weakness that resolved.  Consider TIA versus new stroke versus cervical radiculopathy.  Plan to get MRI brain and cervical spine  7:30 PM MRI showed acute left precentral gyrus stroke.  Dr. Evalee Hila from neurology will see patient.  Patient will be admitted to the medicine team   Problems  Addressed: Cerebrovascular accident (CVA), unspecified mechanism (HCC): acute illness or injury  Amount and/or Complexity of Data Reviewed Labs: ordered. Decision-making details documented in ED Course. Radiology: ordered and independent interpretation performed. Decision-making details documented in ED Course.  Risk Prescription drug management. Decision regarding hospitalization.    Final Clinical Impression(s) / ED Diagnoses Final diagnoses:  None    Rx / DC Orders ED Discharge Orders     None         Dalene Duck, MD 02/27/24 1931

## 2024-02-27 NOTE — ED Notes (Signed)
 Patient transported to CT

## 2024-02-27 NOTE — Consult Note (Addendum)
 NEUROLOGY CONSULT NOTE   Date of service: Feb 27, 2024 Patient Name: Brendan [name] MRN:  [id number] DOB:  [birth year] Chief Complaint: "Right hand weakness" Requesting Provider: Davida Espy, MD  History of Present Illness  Brendan [name] is a 63 y.o. male with hx of CAD on aspirin , diabetes, hypertension, hyperlipidemia, sarcoidosis, history of PFO documented in the neurology note from 08/06/2023 office visit with Dr. Mira Amend at [facility], prior stroke where no acute imaging was done for left-sided numbness of face arm and leg after a knee surgery but later MRI with concern for chronic right thalamic lacunar infarct with mild residual paresthesias on the left side, now presents for evaluation of difficulty with writing with the right hand. Reports that right around 11 AM today, he was taking some notes from a voicemail or a phone call and could not hold a pencil in his hand.  It seemed like his right hand would not follow his command.  The symptoms lasted for about 15 minutes and then resolved.  No other symptoms noted.  Currently symptom-free.  LKW: 11 AM Modified rankin score: 0-Completely asymptomatic and back to baseline post- stroke IV Thrombolysis: Outside the window EVT: Small punctate cortical infarct-clinically less likely to be from an LVO, no LVO features on exam  NIH stroke scale 0  ROS  Comprehensive ROS performed and pertinent positives documented in HPI    Past History   Past Medical History:  Diagnosis Date   Coronary artery disease    Diabetes mellitus without complication (HCC)    Hyperlipidemia    Hypertension    Kidney disease    Sarcoidosis    Stroke [facility])     Past Surgical History:  Procedure Laterality Date   CARDIAC CATHETERIZATION     JOINT REPLACEMENT Bilateral    TKR   RIGHT/LEFT HEART CATH AND CORONARY ANGIOGRAPHY N/A 08/01/2022   Procedure: RIGHT/LEFT HEART CATH AND CORONARY ANGIOGRAPHY;  Surgeon: [place], Peter M, MD;   Location: [facility] INVASIVE CV LAB;  Service: Cardiovascular;  Laterality: N/A;   ROTATOR CUFF REPAIR Left     Family History: Family History  Problem Relation Age of Onset   Heart failure Mother 69    Social History  reports that he has never smoked. He has never used smokeless tobacco. He reports that he does not currently use alcohol. He reports that he does not currently use drugs.  Allergies  Allergen Reactions   Latex Swelling   Lisinopril Swelling   Losartan Swelling   Metformin Nausea Only   Sildenafil Palpitations    Other reaction(s): Headache, Palpitation, Headache, Palpitation, Headache, Palpitations    Medications  No current facility-administered medications for this encounter.  Current Outpatient Medications:    albuterol (VENTOLIN HFA) 108 (90 Base) MCG/ACT inhaler, Inhale 1-2 puffs into the lungs every 6 (six) hours as needed for shortness of breath or wheezing., Disp: , Rfl:    amLODipine (NORVASC) 10 MG tablet, Take 1 tablet by mouth daily., Disp: , Rfl:    amoxicillin  (AMOXIL ) 500 MG capsule, TAKE FOUR CAPSULES BY MOUTH ONCE 1 HOUR PRIOR TO THE DENTAL TREATMENT, Disp: 4 capsule, Rfl: 3   aspirin  EC 81 MG tablet, Take 81 mg by mouth at bedtime. Swallow whole., Disp: , Rfl:    atorvastatin (LIPITOR) 80 MG tablet, Take 80 mg by mouth daily., Disp: , Rfl:    clobetasol cream (TEMOVATE) 0.05 %, , Disp: , Rfl:    diazepam (VALIUM) 5 MG tablet, Take 5  mg by mouth daily as needed for anxiety., Disp: , Rfl:    diclofenac Sodium (VOLTAREN) 1 % GEL, Apply 2 g topically as needed., Disp: , Rfl:    DULoxetine  (CYMBALTA ) 30 MG capsule, Take 30 mg by mouth 2 (two) times daily., Disp: , Rfl:    empagliflozin (JARDIANCE) 25 MG TABS tablet, Take 0.5 tablets by mouth daily., Disp: , Rfl:    EPINEPHrine  0.3 mg/0.3 mL IJ SOAJ injection, Inject 0.3 mg into the muscle as needed for anaphylaxis., Disp: 1 each, Rfl: 0   escitalopram  (LEXAPRO ) 10 MG tablet, Take 10 mg by mouth at bedtime.,  Disp: , Rfl:    ezetimibe (ZETIA) 10 MG tablet, Take 10 mg by mouth daily., Disp: , Rfl:    hydrOXYzine (ATARAX) 25 MG tablet, Take 25 mg by mouth 3 (three) times daily., Disp: , Rfl:    mometasone -formoterol  (DULERA) 100-5 MCG/ACT AERO, Inhale 2 puffs into the lungs in the morning and at bedtime., Disp: 26.4 g, Rfl: 11   Semaglutide,0.25 or 0.5MG /DOS, 2 MG/3ML SOPN, Inject 0.5 mg into the skin once a week., Disp: , Rfl:    traZODone (DESYREL) 100 MG tablet, Take 100 mg by mouth at bedtime., Disp: , Rfl:    Vitamin D, Ergocalciferol, (DRISDOL) 1.25 MG (50000 UNIT) CAPS capsule, Take 50,000 Units by mouth every 7 (seven) days., Disp: , Rfl:    zolpidem (AMBIEN) 10 MG tablet, Take 10 mg by mouth at bedtime., Disp: , Rfl:   Vitals   Vitals:   02/27/24 1120 02/27/24 1508  BP: 136/74 135/79  Pulse: 92 75  Resp: 17 17  Temp: 97.9 F (36.6 C) 97.8 F (36.6 C)  SpO2: 97% 100%    There is no height or weight on file to calculate BMI.  Physical Exam   GENERAL: Awake, alert in NAD HEENT: - Normocephalic and atraumatic, dry mm, no LN++, no Thyromegally LUNGS - Clear to auscultation bilaterally with no wheezes CV - S1S2 RRR, no m/r/g, equal pulses bilaterally. ABDOMEN - Soft, nontender, nondistended with normoactive BS NEURO:  Mental Status: AA&Ox3 Speech and Language: speech is nondysarthric.  Naming, repetition, fluency, and comprehension intact. Cranial Nerves: PERRL. EOMI, visual fields full, no facial asymmetry, facial sensation intact, hearing intact, tongue/uvula/soft palate midline, normal sternocleidomastoid and trapezius muscle strength. No evidence of tongue atrophy or fibrillations Motor: No drift in any of the 4 extremities although finger taps mildly reduced on the right hand. Tone: is normal and bulk is normal Sensation- Intact to light touch bilaterally Coordination: FTN intact bilaterally, no ataxia in BLE. Gait- deferred   Labs/Imaging/Neurodiagnostic studies   CBC:   Recent Labs  Lab 02/27/24 1134  WBC 5.9  NEUTROABS 3.0  HGB 13.1  HCT 40.2  MCV 92.2  PLT 175   Basic Metabolic Panel:  Lab Results  Component Value Date   NA 139 02/27/2024   K 3.8 02/27/2024   CO2 25 02/27/2024   GLUCOSE 237 (H) 02/27/2024   BUN 11 02/27/2024   CREATININE 1.47 (H) 02/27/2024   CALCIUM 9.4 02/27/2024   GFRNONAA 53 (L) 02/27/2024   GFRAA  06/29/2007    >60        The eGFR has been calculated using the MDRD equation. 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 By the appearance, strong suspicion for a cardioembolic etiology. Has a history of PFO. No history of any fibrillation  Impression: Acute ischemic stroke-for now cryptogenic-etiology under investigation   RECOMMENDATIONS  Admit to hospitalist Neurochecks Telemetry Aspirin  81+ Plavix 75-duration TBD after head and neck imaging CTA head and neck 2D echo A1c Lipid panel PT OT Speech therapy Permissive hypertension for 24 to 48 hours-treat only if systolic is greater than 220 on a as needed basis.  Eventual blood pressure goal normotension High intensity statin for LDL goal less than 70 A1c goal less than 7 Check LE dopplers Stroke team to follow Plan discussed with Dr. Delana Favors and plan relayed via secure chat to Dr.  Carlton Chick ______________________________________________________________________    Signed, Tona Francis, MD [facility]

## 2024-02-27 NOTE — ED Triage Notes (Signed)
 Patient arrives stating that at 1100 he was on the phone and was trying to use his R hand to write but he was unable to grip the pen or do anything with that hand. Hand was not flaccid, he could just not get it to do what he wanted. This lasted 15 minutes and then totally resolved.

## 2024-02-28 ENCOUNTER — Inpatient Hospital Stay (HOSPITAL_COMMUNITY)

## 2024-02-28 ENCOUNTER — Encounter (HOSPITAL_COMMUNITY): Payer: Self-pay | Admitting: Internal Medicine

## 2024-02-28 DIAGNOSIS — I69398 Other sequelae of cerebral infarction: Secondary | ICD-10-CM

## 2024-02-28 DIAGNOSIS — Q2112 Patent foramen ovale: Secondary | ICD-10-CM

## 2024-02-28 DIAGNOSIS — E785 Hyperlipidemia, unspecified: Secondary | ICD-10-CM

## 2024-02-28 DIAGNOSIS — I69391 Dysphagia following cerebral infarction: Secondary | ICD-10-CM

## 2024-02-28 DIAGNOSIS — Z8673 Personal history of transient ischemic attack (TIA), and cerebral infarction without residual deficits: Secondary | ICD-10-CM | POA: Diagnosis not present

## 2024-02-28 DIAGNOSIS — E119 Type 2 diabetes mellitus without complications: Secondary | ICD-10-CM

## 2024-02-28 DIAGNOSIS — Z7984 Long term (current) use of oral hypoglycemic drugs: Secondary | ICD-10-CM

## 2024-02-28 DIAGNOSIS — Z7985 Long-term (current) use of injectable non-insulin antidiabetic drugs: Secondary | ICD-10-CM

## 2024-02-28 DIAGNOSIS — I6523 Occlusion and stenosis of bilateral carotid arteries: Secondary | ICD-10-CM | POA: Diagnosis not present

## 2024-02-28 DIAGNOSIS — I672 Cerebral atherosclerosis: Secondary | ICD-10-CM | POA: Diagnosis not present

## 2024-02-28 DIAGNOSIS — I639 Cerebral infarction, unspecified: Secondary | ICD-10-CM | POA: Diagnosis not present

## 2024-02-28 DIAGNOSIS — R202 Paresthesia of skin: Secondary | ICD-10-CM

## 2024-02-28 DIAGNOSIS — R29700 NIHSS score 0: Secondary | ICD-10-CM | POA: Diagnosis not present

## 2024-02-28 LAB — LIPID PANEL
Cholesterol: 97 mg/dL (ref 0–200)
HDL: 35 mg/dL — ABNORMAL LOW
LDL Cholesterol: 49 mg/dL (ref 0–99)
Total CHOL/HDL Ratio: 2.8 ratio
Triglycerides: 65 mg/dL
VLDL: 13 mg/dL (ref 0–40)

## 2024-02-28 LAB — I-STAT CHEM 8, ED
BUN: 10 mg/dL (ref 8–23)
Calcium, Ion: 1.16 mmol/L (ref 1.15–1.40)
Chloride: 102 mmol/L (ref 98–111)
Creatinine, Ser: 1.2 mg/dL (ref 0.61–1.24)
Glucose, Bld: 209 mg/dL — ABNORMAL HIGH (ref 70–99)
HCT: 41 % (ref 39.0–52.0)
Hemoglobin: 13.9 g/dL (ref 13.0–17.0)
Potassium: 3.8 mmol/L (ref 3.5–5.1)
Sodium: 137 mmol/L (ref 135–145)
TCO2: 23 mmol/L (ref 22–32)

## 2024-02-28 LAB — GLUCOSE, CAPILLARY
Glucose-Capillary: 175 mg/dL — ABNORMAL HIGH (ref 70–99)
Glucose-Capillary: 96 mg/dL (ref 70–99)

## 2024-02-28 LAB — CBG MONITORING, ED
Glucose-Capillary: 149 mg/dL — ABNORMAL HIGH (ref 70–99)
Glucose-Capillary: 168 mg/dL — ABNORMAL HIGH (ref 70–99)

## 2024-02-28 MED ORDER — IOHEXOL 350 MG/ML SOLN
125.0000 mL | Freq: Once | INTRAVENOUS | Status: AC | PRN
Start: 2024-02-28 — End: 2024-02-28
  Administered 2024-02-28: 125 mL via INTRAVENOUS

## 2024-02-28 MED ORDER — CLOPIDOGREL BISULFATE 75 MG PO TABS
75.0000 mg | ORAL_TABLET | Freq: Every day | ORAL | Status: DC
  Administered 2024-02-28 – 2024-02-29 (×2): 75 mg via ORAL
  Filled 2024-02-28 (×2): qty 1

## 2024-02-28 NOTE — Plan of Care (Signed)
  Problem: Coping: Goal: Ability to adjust to condition or change in health will improve Outcome: Progressing   Problem: Fluid Volume: Goal: Ability to maintain a balanced intake and output will improve Outcome: Progressing   Problem: Metabolic: Goal: Ability to maintain appropriate glucose levels will improve Outcome: Progressing   Problem: Nutritional: Goal: Maintenance of adequate nutrition will improve Outcome: Progressing   Problem: Skin Integrity: Goal: Risk for impaired skin integrity will decrease Outcome: Progressing   Problem: Health Behavior/Discharge Planning: Goal: Ability to manage health-related needs will improve Outcome: Progressing   Problem: Clinical Measurements: Goal: Will remain free from infection Outcome: Progressing Goal: Diagnostic test results will improve Outcome: Progressing Goal: Respiratory complications will improve Outcome: Progressing   Problem: Nutrition: Goal: Adequate nutrition will be maintained Outcome: Progressing   Problem: Coping: Goal: Level of anxiety will decrease Outcome: Progressing   Problem: Elimination: Goal: Will not experience complications related to bowel motility Outcome: Progressing

## 2024-02-28 NOTE — Progress Notes (Addendum)
 STROKE TEAM PROGRESS NOTE   INTERIM HISTORY/SUBJECTIVE Will likely need outpatient TEE and 30 day heart monitor. Consider PFO closure in the future given this is his 3 rd event. Message sent to request these.  Echo w/ bubble, Venous duplex and TCD bubble study ordered. Follows with Dr. [place] at [facility].  Hand symptoms lasted about 15 minutes and have resolved.  He does have residual left facial numbness and left hand numbness from his prior stroke. He seems keen to go home and may not want to stay OBJECTIVE  CBC    Component Value Date/Time   WBC 5.9 02/27/2024 1134   RBC 4.36 02/27/2024 1134   HGB 13.9 02/28/2024 1102   HGB 14.0 07/29/2022 1420   HCT 41.0 02/28/2024 1102   HCT 41.9 07/29/2022 1420   PLT 175 02/27/2024 1134   PLT 190 07/29/2022 1420   MCV 92.2 02/27/2024 1134   MCV 90 07/29/2022 1420   MCH 30.0 02/27/2024 1134   MCHC 32.6 02/27/2024 1134   RDW 13.3 02/27/2024 1134   RDW 12.6 07/29/2022 1420   LYMPHSABS 2.2 02/27/2024 1134   LYMPHSABS 1.8 07/29/2022 1420   MONOABS 0.4 02/27/2024 1134   EOSABS 0.3 02/27/2024 1134   EOSABS 0.3 07/29/2022 1420   BASOSABS 0.1 02/27/2024 1134   BASOSABS 0.0 07/29/2022 1420    BMET    Component Value Date/Time   NA 137 02/28/2024 1102   NA 141 07/29/2022 1420   K 3.8 02/28/2024 1102   CL 102 02/28/2024 1102   CO2 25 02/27/2024 1134   GLUCOSE 209 (H) 02/28/2024 1102   BUN 10 02/28/2024 1102   BUN 17 07/29/2022 1420   CREATININE 1.20 02/28/2024 1102   CALCIUM 9.4 02/27/2024 1134   EGFR 48 (L) 07/29/2022 1420   GFRNONAA 53 (L) 02/27/2024 1134    IMAGING past 24 hours CT ANGIO HEAD NECK W WO CM Result Date: 02/28/2024 CLINICAL DATA:  Stroke follow-up. Acute cortical infarct involving the left precentral gyrus. Abnormal function of the right hand. EXAM: CT ANGIOGRAPHY HEAD AND NECK WITH AND WITHOUT CONTRAST TECHNIQUE: Multidetector CT imaging of the head and neck was performed using the standard protocol during bolus  administration of intravenous contrast. Multiplanar CT image reconstructions and MIPs were obtained to evaluate the vascular anatomy. Carotid stenosis measurements (when applicable) are obtained utilizing NASCET criteria, using the distal internal carotid diameter as the denominator. RADIATION DOSE REDUCTION: This exam was performed according to the departmental dose-optimization program which includes automated exposure control, adjustment of the mA and/or kV according to patient size and/or use of iterative reconstruction technique. CONTRAST:  OMNIPAQUE  IOHEXOL  350 MG/ML SOLN COMPARISON:  CT head without contrast and MR head without contrast 02/27/2024 FINDINGS: CT HEAD FINDINGS Brain: The left precentral gyrus infarct is stable. Remote lacunar infarcts are again noted in the right thalamus. No other acute intracranial abnormality is present. No acute hemorrhage or other mass lesion is present. Mild white matter changes are stable. The ventricles are of normal size. No significant extraaxial fluid collection is present. The brainstem and cerebellum are within normal limits. Vascular: Atherosclerotic calcifications are present within the cavernous internal carotid arteries bilaterally. Hyperdense vessel is present. Skull: Calvarium is intact. No focal lytic or blastic lesions are present. No significant extracranial soft tissue lesion is present. Sinuses/Orbits: The paranasal sinuses and mastoid air cells are clear. The globes and orbits are within normal limits. Review of the MIP images confirms the above findings CTA NECK FINDINGS Aortic arch: A  3 vessel arch configuration is present. Minimal calcifications are present in the distal arch. Great vessel origins are widely patent. Right carotid system: The right common carotid artery is within normal limits. Minimal calcification is present at the right carotid bifurcation without significant stenosis. The cervical right ICA is otherwise normal. Left carotid  system: The left common carotid artery is within normal limits. Minimal calcifications present bifurcation and proximal left ICA without significant stenosis. Cervical left ICA is otherwise normal. Vertebral arteries: The left vertebral artery is the dominant vessel. Both vertebral arteries originate from the subclavian arteries without significant stenosis. No significant stenosis is present in either vertebral artery in neck. Skeleton: Endplate changes and right greater than left uncovertebral spurring is noted in the cervical spine. No significant listhesis is present. Straightening of the normal cervical lordosis is present. Other neck: The soft tissues of the neck are unremarkable. Salivary glands are within normal limits. Thyroid  is normal. No significant adenopathy is present. No focal mucosal or submucosal lesions are present. Upper chest: The lung apices are clear. The thoracic inlet is within normal limits. Review of the MIP images confirms the above findings CTA HEAD FINDINGS Anterior circulation: Calcifications are present within the cavernous internal carotid arteries bilaterally without significant stenosis through the ICA termini. A1 and M1 segments are normal. Anterior communicating artery is patent. MCA bifurcations are normal bilaterally moderate stenosis is present in distal anterior and inferior left M2 segment. The ACA and MCA branch vessels are otherwise within normal limits. No aneurysm present. Posterior circulation: Atherosclerotic changes are present the dural margin of the left vertebral artery without significant stenosis relative to distal vessels. The vertebrobasilar junction basilar artery is normal. The left AICA is dominant. The superior cerebellar arteries patent bilaterally. Posterior cerebral arteries originate the basilar tip. The PCA branch vessels are normal bilaterally. No aneurysm is present. Venous sinuses: The dural sinuses are patent. The straight sinus and deep cerebral  veins are intact. Cortical veins are within normal limits. No significant vascular malformation is evident. Anatomic variants: None Review of the MIP images confirms the above findings IMPRESSION: 1. Stable left precentral gyrus infarct. 2. Remote lacunar infarcts of the right thalamus. 3. Mild calcification at the carotid bifurcations bilaterally without significant stenosis. 4. Moderate stenosis of the distal anterior and inferior left M2 segment. 5. No other significant proximal stenosis, aneurysm, or branch vessel occlusion within the Circle of Willis. 6. Minimal atherosclerotic changes at the carotid bifurcations bilaterally without significant stenosis. CTA neck is otherwise unremarkable. 7. Multilevel spondylosis of the cervical spine. Electronically Signed   By: Audree Leas M.D.   On: 02/28/2024 11:21   MR BRAIN WO CONTRAST Result Date: 02/27/2024 CLINICAL DATA:  Neuro deficit, acute, stroke suspected. Mental status change. Episode in which he was unable to use his right hand for approximately 15 minutes today. Symptoms have since resolved. EXAM: MRI HEAD WITHOUT CONTRAST TECHNIQUE: Multiplanar, multiecho pulse sequences of the brain and surrounding structures were obtained without intravenous contrast. COMPARISON:  CT head without contrast 02/27/2024. MR head without contrast 01/01/2021 FINDINGS: Brain: No acute infarct, hemorrhage, a 5 mm acute nonhemorrhagic infarct is present in the left precentral gyrus corresponding to the right hand. Focal T2 and FLAIR signal is associated. Periventricular and scattered subcortical T2 hyperintensities bilaterally are moderately advanced for age, slightly progressed since the prior exam. Remote lacunar infarcts are again noted within the right thalamus. A remote cortical infarct is present in the medial right occipital lobe. The ventricles are of normal  size. No significant extraaxial fluid collection is present. The brainstem and cerebellum are within  normal limits. Midline structures are within normal limits. Vascular: Flow is present in the major intracranial arteries. Skull and upper cervical spine: The craniocervical junction is normal. Upper cervical spine is within normal limits. Marrow signal is unremarkable. Sinuses/Orbits: The paranasal sinuses and mastoid air cells are clear. The globes and orbits are within normal limits. IMPRESSION: 1. 5 mm acute nonhemorrhagic infarct of the left precentral gyrus corresponding to the right hand. 2. Periventricular and scattered subcortical T2 hyperintensities bilaterally are moderately advanced for age, slightly progressed since the prior exam. The finding is nonspecific but can be seen in the setting of chronic microvascular ischemia, a demyelinating process such as multiple sclerosis, vasculitis, complicated migraine headaches, or as the sequelae of a prior infectious or inflammatory process. 3. Remote lacunar infarcts of the right thalamus. 4. Remote cortical infarct of the medial right occipital lobe. These results were called by telephone at the time of interpretation on 02/27/2024 at 7:01 pm to provider DAVID YAO , who verbally acknowledged these results. Electronically Signed   By: Audree Leas M.D.   On: 02/27/2024 19:21   MR Cervical Spine Wo Contrast Result Date: 02/27/2024 CLINICAL DATA:  Myelopathy, acute, cervical spine. Right arm numbness. EXAM: MRI CERVICAL SPINE WITHOUT CONTRAST TECHNIQUE: Multiplanar, multisequence MR imaging of the cervical spine was performed. No intravenous contrast was administered. COMPARISON:  None Available. FINDINGS: Alignment: No significant listhesis is present. Straightening of the normal cervical lordosis is present. Vertebrae: Marrow signal and vertebral body heights are normal. Cord: Normal signal and morphology. Posterior Fossa, vertebral arteries, paraspinal tissues: Craniocervical junction is normal. Flow is present in the vertebral arteries bilaterally.  Visualized intracranial contents are normal. Disc levels: C2-3: Uncovertebral spurring is present bilaterally without significant stenosis. C3-4: A broad-based disc osteophyte complex effaces the ventral CSF. Moderate central and bilateral foraminal stenosis is present. Facet hypertrophy is worse on the left. C4-5: A broad-based disc osteophyte complex present. Uncovertebral and facet disease worse on the right. Moderate central canal stenosis is present. Severe right and moderate left foraminal stenosis is present. C5-6: A broad-based disc osteophyte complex is present. Partial effacement of ventral CSF is noted. Uncovertebral spurring contributes to moderate foraminal stenosis bilaterally. C6-7: A broad-based disc osteophyte complex is present. Central canal is patent. Uncovertebral spurring contributes to moderate foraminal stenosis bilaterally. C7-T1: Negative. IMPRESSION: 1. Multilevel spondylosis of the cervical spine as described. 2. Moderate central canal stenosis at C3-4 and C4-5. 3. Severe right and moderate left foraminal stenosis at C4-5. 4. Moderate foraminal stenosis bilaterally at C5-6 and C6-7. Electronically Signed   By: Audree Leas M.D.   On: 02/27/2024 19:06   CT Head Wo Contrast Result Date: 02/27/2024 CLINICAL DATA:  Mental status change, unknown cause EXAM: CT HEAD WITHOUT CONTRAST TECHNIQUE: Contiguous axial images were obtained from the base of the skull through the vertex without intravenous contrast. RADIATION DOSE REDUCTION: This exam was performed according to the departmental dose-optimization program which includes automated exposure control, adjustment of the mA and/or kV according to patient size and/or use of iterative reconstruction technique. COMPARISON:  January 01, 2021 FINDINGS: Brain: The ventricles appear age appropriate. No mass effect or midline shift. Scattered periventricular white matter hypoattenuation, most consistent with changes of mild chronic ischemic  microvascular disease. Small chronic lacunar infarcts in the right thalamus. no evidence of acute territorial infarction, extra-axial fluid collection, hemorrhage, or mass lesion. The basilar cisterns are patent without downward  herniation. The cerebellar hemispheres and vermis are well formed without mass lesion or focal attenuation abnormality. No Vascular: No hyperdense vessel. Calcified atherosclerotic plaque within the cavernous/supraclinoid ICA and intradural vertebral arteries. Skull: Normal. Negative for fracture or focal lesion. Sinuses/Orbits: The paranasal sinuses and mastoids are clear. The globes appear intact. No retrobulbar hematoma. Other: None. IMPRESSION: No acute intracranial abnormality, specifically, no acute hemorrhage, territorial infarction, or intracranial mass. Electronically Signed   By: Rance Burrows M.D.   On: 02/27/2024 14:05    Vitals:   02/28/24 0615 02/28/24 0621 02/28/24 0948 02/28/24 1000  BP: (!) 139/91  126/78 (!) 135/91  Pulse: 87  89 86  Resp: 18  15 (!) 24  Temp:  98 F (36.7 C)  97.9 F (36.6 C)  TempSrc:  Oral    SpO2: 99%  95% 100%     PHYSICAL EXAM General:  Alert, well-nourished, well-developed middle-aged African-American male in no acute distress Psych:  Mood and affect appropriate for situation CV: Regular rate and rhythm on monitor Respiratory:  Regular, unlabored respirations on room air GI: Abdomen soft and nontender   NEURO:  Mental Status: AA&Ox3, patient is able to give clear and coherent history Speech/Language: speech is without dysarthria or aphasia.  Naming, repetition, fluency, and comprehension intact.  Cranial Nerves:  II: PERRL. Visual fields full.  III, IV, VI: EOMI. Eyelids elevate symmetrically.  V: Sensation is intact to light touch and symmetrical to face. Residual left numbness VII: Face is symmetrical resting and smiling VIII: hearing intact to voice. IX, X: Palate elevates symmetrically. Phonation is normal.   NW:GNFAOZHY shrug 5/5. XII: tongue is midline without fasciculations. Motor: 5/5 strength to all muscle groups tested.  Tone: is normal and bulk is normal Sensation- Intact to light touch bilaterally. Extinction absent to light touch to DSS.  Residual left numbness Coordination: FTN intact bilaterally, HKS: no ataxia in BLE.No drift.  Gait- deferred  Most Recent NIH 0    ASSESSMENT/PLAN  Mr. Brendan [name] is a 63 y.o. male with history of CAD on aspirin , diabetes, hypertension, hyperlipidemia, sarcoidosis, history of PFO documented in the neurology note from 08/06/2023 office visit with Dr. Mira Amend at [facility], prior stroke where no acute imaging was done for left-sided numbness of face arm and leg after a knee surgery but later MRI with concern for chronic right thalamic lacunar infarct with mild residual paresthesias on the left side, now presents for evaluation of difficulty with writing with the right hand.  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   CTA head & neck Moderate stenosis of the distal anterior and inferior left M2 segment. MRI  Punctate cortical infarct-left precentral gyrus-likely hand knob infarct remote age small right occipital cortex and right thalamic infarcts Venous duplex DVT - pending TCD Bubble study  - pending 2D Echo - pending  LDL 49 HgbA1c 7.0 VTE prophylaxis - SCDs aspirin  81 mg daily prior to admission, now on aspirin  81 mg daily and clopidogrel 75 mg daily for 3 weeks and then plavix alone. 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  Hx of PFO TCD Bubble Echo with bubble Venous duplex Established with heart care   Hypertension Home meds:  Norvasc Stable Blood Pressure Goal: BP less than 220/110   Hyperlipidemia Home meds:  Atorvastatin  80 mg, Zetia resumed in hospital LDL 49, goal < 70 Continue statin at discharge  Diabetes type II Controlled Home meds: Jardiance, semaglutide HgbA1c 7.0, goal < 7.0 CBGs SSI Recommend close follow-up with PCP for better DM control  Dysphagia Patient has post-stroke dysphagia, SLP consulted    Diet   Diet NPO time specified   Advance diet as tolerated  Other Stroke Risk Factors Coronary artery disease- on ASA 81mg  already  Hospital day # 1  Patient seen and examined by NP/APP with MD. MD to update note as needed.   Imogene Mana, DNP, FNP-BC [facility] Pager: [phone]  I have personally obtained history,examined this patient, reviewed notes, independently viewed imaging studies, participated in medical decision making and plan of care.ROS completed by me personally and pertinent positives fully documented  I have made any additions or clarifications directly to the above note. Agree with note above.  Patient presented with 15-minute episode of right hand weakness with MRI scan showing small embolic left parietal infarct involving hand area.  Etiology of this likely embolic of cryptogenic source.  Patient with recurrent stroke as he had previous history of thalamic infarcts following knee surgery and there was a silent right occipital infarct noted in the left time.  He will need prolonged cardiac monitoring for paroxysmal A-fib.  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  Check lower extremity venous Dopplers for DVT and TCD bubble study.  Recommend aspirin  and Plavix for 3 weeks followed by Plavix alone and aggressive risk factor modification.  Long discussion with patient and wife at the bedside and answered questions.  Discussed possible participation in [place] stroke prevention study but patient  declined. Greater than 50% time during this 50-minute visit was spent in counseling and coordination of care about his cryptogenic stroke and discussion about further evaluation and treatment and answering questions.  Discussed with Dr. Sofia Dunn, MD Medical Director [facility] Pager: [phone] 02/28/2024 2:37 PM    To contact Stroke Continuity provider, please refer to [email or web address]. After hours, contact General Neurology

## 2024-02-28 NOTE — Progress Notes (Signed)
 Physical Therapy Note  Patient is functioning at a high level of independence and no physical therapy is indicated at this time. Independent PTA with intermittent use of cane for knee pain. He feels back to baseline. SILT throughout, strength grossly 5/5. FNF in tact although slower fine motor movements noted with Rt hand. Able to ambulate safely without LOB, no assistive device. BERG 54/56 indicating low fall risk. BEFAST acronym reviewed with pt and wife. PT is signing-off. Please re-order if there is any significant change in status. Thank you for this referral.  Jory Ng, PT, DPT [facility] Physical Therapist Office: [phone] Website: [email or web address]

## 2024-02-28 NOTE — ED Notes (Signed)
 Notified provider that pt wants d/c orders

## 2024-02-28 NOTE — Progress Notes (Signed)
 SLP Cancellation Note  Patient Details Name: Brendan [name] MRN: [id number] DOB: 03-29-1961   Cancelled treatment:       Reason Eval/Treat Not Completed: SLP screened, no needs identified, will sign off  Per RN, patient not exhibiting any dysphagia symptoms. SLP spoke briefly with patient and spouse. Patient denies any new symptoms from this CVA and that symptoms that remain from previous CVA include numbness on left side of face and left hand. SLP observed patient with consecutive straw sips of water, with timely swallow initiation observed and no overt s/s. CT arrived to get patient. Full evaluation no warranted at this time, SLP to s/o.  Jacqualine Mater, MA, CCC-SLP Speech Therapy

## 2024-02-28 NOTE — Discharge Summary (Incomplete)
 Physician Discharge Summary  TRYG INDELICATO QIO:[id number] DOB: February 22, 1961 DOA: 02/27/2024  PCP: Default, Provider, MD  Admit date: 02/27/2024 Discharge date: 02/28/2024  Admitted From: Home Disposition:  Home  Recommendations for Outpatient Follow-up:  Follow up with PCP in 1-2 weeks Follow up with cardiology as discussed for TEE/PFO evaluation Follow up with neurology as scheduled  Home Health:None  Equipment/Devices:None  Discharge Condition:Stable  CODE STATUS:Full  Diet recommendation: Low salt low fat diet  Brief/Interim Summary: ***Brendan [name] is a 63 y.o. male with medical history significant of coronary artery disease, non-insulin-dependent diabetes, hyperlipidemia, essential hypertension, chronic kidney disease stage IIIa, sarcoidosis, previous CVA, history of PFO, who presented to the ER with strokelike symptoms of right hand weakness/paresthesias. Outside window for TPA.  MRI confirms acute CVA, known to be his 3rd stroke. CTA notes moderate stenosis of M2 segment. Echo confirms***bubble study***   Discharge Diagnoses:  Principal Problem:   Acute CVA (cerebrovascular accident) (HCC) Active Problems:   Sarcoidosis   Reactive airway disease   GERD (gastroesophageal reflux disease)  *** Acute 5mm nonhemorrhagic infarct of the L precentral gyrus, POA -Imaging confirms acute CVA; as well as remote infarct of thalamus/occipital lobe -Neuro following - appreciate insight/recs -On single agent aspirin  at baseline - continue asa/plavix in the interim until otherwise specified by neuro***?Plavix?***NOT ORDERED*** -A1C 7, HDL low at 35, lipid profile otherwise WNL   Follow CTA head and neck*** Echocardiogram***  PT/OT/SLP Permissive htn --> resume medications***   Diabetes, NIDDM2: Initiate sliding scale insulin. Hold home regimen Hyperlipidemia: Continue high intensity statin statins, on atorvastatin 80 at baseline Essential hypertension: Continue with  blood pressure monitoring - permissive HTN as above CKD 3a: Monitor renal function closely, appears to be near baseline Coronary artery disease: Patient to remain on aspirin  and Plavix as above.  Asymptomatic at this point - no indication for further evaluation.   Sarcoidosis: Defer to outpatient treatment. ***  Discharge Instructions   Allergies as of 02/28/2024       Reactions   Latex Swelling   Lisinopril Swelling   Losartan Swelling   Metformin Nausea Only   Sildenafil Palpitations   Other reaction(s): Headache, Palpitation, Headache, Palpitation, Headache, Palpitations     Med Rec must be completed prior to using this [employer]***       Allergies  Allergen Reactions   Latex Swelling   Lisinopril Swelling   Losartan Swelling   Metformin Nausea Only   Sildenafil Palpitations    Other reaction(s): Headache, Palpitation, Headache, Palpitation, Headache, Palpitations    Consultations: ***Specify Physician/Group   Procedures/Studies: CT ANGIO HEAD NECK W WO CM Result Date: 02/28/2024 CLINICAL DATA:  Stroke follow-up. Acute cortical infarct involving the left precentral gyrus. Abnormal function of the right hand. EXAM: CT ANGIOGRAPHY HEAD AND NECK WITH AND WITHOUT CONTRAST TECHNIQUE: Multidetector CT imaging of the head and neck was performed using the standard protocol during bolus administration of intravenous contrast. Multiplanar CT image reconstructions and MIPs were obtained to evaluate the vascular anatomy. Carotid stenosis measurements (when applicable) are obtained utilizing NASCET criteria, using the distal internal carotid diameter as the denominator. RADIATION DOSE REDUCTION: This exam was performed according to the departmental dose-optimization program which includes automated exposure control, adjustment of the mA and/or kV according to patient size and/or use of iterative reconstruction technique. CONTRAST:  OMNIPAQUE  IOHEXOL  350 MG/ML SOLN COMPARISON:  CT  head without contrast and MR head without contrast 02/27/2024 FINDINGS: CT HEAD FINDINGS Brain: The left precentral gyrus infarct is stable.  Remote lacunar infarcts are again noted in the right thalamus. No other acute intracranial abnormality is present. No acute hemorrhage or other mass lesion is present. Mild white matter changes are stable. The ventricles are of normal size. No significant extraaxial fluid collection is present. The brainstem and cerebellum are within normal limits. Vascular: Atherosclerotic calcifications are present within the cavernous internal carotid arteries bilaterally. Hyperdense vessel is present. Skull: Calvarium is intact. No focal lytic or blastic lesions are present. No significant extracranial soft tissue lesion is present. Sinuses/Orbits: The paranasal sinuses and mastoid air cells are clear. The globes and orbits are within normal limits. Review of the MIP images confirms the above findings CTA NECK FINDINGS Aortic arch: A 3 vessel arch configuration is present. Minimal calcifications are present in the distal arch. Great vessel origins are widely patent. Right carotid system: The right common carotid artery is within normal limits. Minimal calcification is present at the right carotid bifurcation without significant stenosis. The cervical right ICA is otherwise normal. Left carotid system: The left common carotid artery is within normal limits. Minimal calcifications present bifurcation and proximal left ICA without significant stenosis. Cervical left ICA is otherwise normal. Vertebral arteries: The left vertebral artery is the dominant vessel. Both vertebral arteries originate from the subclavian arteries without significant stenosis. No significant stenosis is present in either vertebral artery in neck. Skeleton: Endplate changes and right greater than left uncovertebral spurring is noted in the cervical spine. No significant listhesis is present. Straightening of the normal  cervical lordosis is present. Other neck: The soft tissues of the neck are unremarkable. Salivary glands are within normal limits. Thyroid  is normal. No significant adenopathy is present. No focal mucosal or submucosal lesions are present. Upper chest: The lung apices are clear. The thoracic inlet is within normal limits. Review of the MIP images confirms the above findings CTA HEAD FINDINGS Anterior circulation: Calcifications are present within the cavernous internal carotid arteries bilaterally without significant stenosis through the ICA termini. A1 and M1 segments are normal. Anterior communicating artery is patent. MCA bifurcations are normal bilaterally moderate stenosis is present in distal anterior and inferior left M2 segment. The ACA and MCA branch vessels are otherwise within normal limits. No aneurysm present. Posterior circulation: Atherosclerotic changes are present the dural margin of the left vertebral artery without significant stenosis relative to distal vessels. The vertebrobasilar junction basilar artery is normal. The left AICA is dominant. The superior cerebellar arteries patent bilaterally. Posterior cerebral arteries originate the basilar tip. The PCA branch vessels are normal bilaterally. No aneurysm is present. Venous sinuses: The dural sinuses are patent. The straight sinus and deep cerebral veins are intact. Cortical veins are within normal limits. No significant vascular malformation is evident. Anatomic variants: None Review of the MIP images confirms the above findings IMPRESSION: 1. Stable left precentral gyrus infarct. 2. Remote lacunar infarcts of the right thalamus. 3. Mild calcification at the carotid bifurcations bilaterally without significant stenosis. 4. Moderate stenosis of the distal anterior and inferior left M2 segment. 5. No other significant proximal stenosis, aneurysm, or branch vessel occlusion within the Circle of Willis. 6. Minimal atherosclerotic changes at the  carotid bifurcations bilaterally without significant stenosis. CTA neck is otherwise unremarkable. 7. Multilevel spondylosis of the cervical spine. Electronically Signed   By: Audree Leas M.D.   On: 02/28/2024 11:21   MR BRAIN WO CONTRAST Result Date: 02/27/2024 CLINICAL DATA:  Neuro deficit, acute, stroke suspected. Mental status change. Episode in which he was unable to  use his right hand for approximately 15 minutes today. Symptoms have since resolved. EXAM: MRI HEAD WITHOUT CONTRAST TECHNIQUE: Multiplanar, multiecho pulse sequences of the brain and surrounding structures were obtained without intravenous contrast. COMPARISON:  CT head without contrast 02/27/2024. MR head without contrast 01/01/2021 FINDINGS: Brain: No acute infarct, hemorrhage, a 5 mm acute nonhemorrhagic infarct is present in the left precentral gyrus corresponding to the right hand. Focal T2 and FLAIR signal is associated. Periventricular and scattered subcortical T2 hyperintensities bilaterally are moderately advanced for age, slightly progressed since the prior exam. Remote lacunar infarcts are again noted within the right thalamus. A remote cortical infarct is present in the medial right occipital lobe. The ventricles are of normal size. No significant extraaxial fluid collection is present. The brainstem and cerebellum are within normal limits. Midline structures are within normal limits. Vascular: Flow is present in the major intracranial arteries. Skull and upper cervical spine: The craniocervical junction is normal. Upper cervical spine is within normal limits. Marrow signal is unremarkable. Sinuses/Orbits: The paranasal sinuses and mastoid air cells are clear. The globes and orbits are within normal limits. IMPRESSION: 1. 5 mm acute nonhemorrhagic infarct of the left precentral gyrus corresponding to the right hand. 2. Periventricular and scattered subcortical T2 hyperintensities bilaterally are moderately advanced for age,  slightly progressed since the prior exam. The finding is nonspecific but can be seen in the setting of chronic microvascular ischemia, a demyelinating process such as multiple sclerosis, vasculitis, complicated migraine headaches, or as the sequelae of a prior infectious or inflammatory process. 3. Remote lacunar infarcts of the right thalamus. 4. Remote cortical infarct of the medial right occipital lobe. These results were called by telephone at the time of interpretation on 02/27/2024 at 7:01 pm to provider DAVID YAO , who verbally acknowledged these results. Electronically Signed   By: Audree Leas M.D.   On: 02/27/2024 19:21   MR Cervical Spine Wo Contrast Result Date: 02/27/2024 CLINICAL DATA:  Myelopathy, acute, cervical spine. Right arm numbness. EXAM: MRI CERVICAL SPINE WITHOUT CONTRAST TECHNIQUE: Multiplanar, multisequence MR imaging of the cervical spine was performed. No intravenous contrast was administered. COMPARISON:  None Available. FINDINGS: Alignment: No significant listhesis is present. Straightening of the normal cervical lordosis is present. Vertebrae: Marrow signal and vertebral body heights are normal. Cord: Normal signal and morphology. Posterior Fossa, vertebral arteries, paraspinal tissues: Craniocervical junction is normal. Flow is present in the vertebral arteries bilaterally. Visualized intracranial contents are normal. Disc levels: C2-3: Uncovertebral spurring is present bilaterally without significant stenosis. C3-4: A broad-based disc osteophyte complex effaces the ventral CSF. Moderate central and bilateral foraminal stenosis is present. Facet hypertrophy is worse on the left. C4-5: A broad-based disc osteophyte complex present. Uncovertebral and facet disease worse on the right. Moderate central canal stenosis is present. Severe right and moderate left foraminal stenosis is present. C5-6: A broad-based disc osteophyte complex is present. Partial effacement of ventral CSF is  noted. Uncovertebral spurring contributes to moderate foraminal stenosis bilaterally. C6-7: A broad-based disc osteophyte complex is present. Central canal is patent. Uncovertebral spurring contributes to moderate foraminal stenosis bilaterally. C7-T1: Negative. IMPRESSION: 1. Multilevel spondylosis of the cervical spine as described. 2. Moderate central canal stenosis at C3-4 and C4-5. 3. Severe right and moderate left foraminal stenosis at C4-5. 4. Moderate foraminal stenosis bilaterally at C5-6 and C6-7. Electronically Signed   By: Audree Leas M.D.   On: 02/27/2024 19:06   CT Head Wo Contrast Result Date: 02/27/2024 CLINICAL DATA:  Mental status  change, unknown cause EXAM: CT HEAD WITHOUT CONTRAST TECHNIQUE: Contiguous axial images were obtained from the base of the skull through the vertex without intravenous contrast. RADIATION DOSE REDUCTION: This exam was performed according to the departmental dose-optimization program which includes automated exposure control, adjustment of the mA and/or kV according to patient size and/or use of iterative reconstruction technique. COMPARISON:  January 01, 2021 FINDINGS: Brain: The ventricles appear age appropriate. No mass effect or midline shift. Scattered periventricular white matter hypoattenuation, most consistent with changes of mild chronic ischemic microvascular disease. Small chronic lacunar infarcts in the right thalamus. no evidence of acute territorial infarction, extra-axial fluid collection, hemorrhage, or mass lesion. The basilar cisterns are patent without downward herniation. The cerebellar hemispheres and vermis are well formed without mass lesion or focal attenuation abnormality. No Vascular: No hyperdense vessel. Calcified atherosclerotic plaque within the cavernous/supraclinoid ICA and intradural vertebral arteries. Skull: Normal. Negative for fracture or focal lesion. Sinuses/Orbits: The paranasal sinuses and mastoids are clear. The globes  appear intact. No retrobulbar hematoma. Other: None. IMPRESSION: No acute intracranial abnormality, specifically, no acute hemorrhage, territorial infarction, or intracranial mass. Electronically Signed   By: Rance Burrows M.D.   On: 02/27/2024 14:05     Subjective: ***   Discharge Exam: Vitals:   02/28/24 0948 02/28/24 1000  BP: 126/78 (!) 135/91  Pulse: 89 86  Resp: 15 (!) 24  Temp:  97.9 F (36.6 C)  SpO2: 95% 100%   Vitals:   02/28/24 0615 02/28/24 0621 02/28/24 0948 02/28/24 1000  BP: (!) 139/91  126/78 (!) 135/91  Pulse: 87  89 86  Resp: 18  15 (!) 24  Temp:  98 F (36.7 C)  97.9 F (36.6 C)  TempSrc:  Oral    SpO2: 99%  95% 100%    General: Pt is alert, awake, not in acute distress Cardiovascular: RRR, S1/S2 +, no rubs, no gallops Respiratory: CTA bilaterally, no wheezing, no rhonchi Abdominal: Soft, NT, ND, bowel sounds + Extremities: no edema, no cyanosis    The results of significant diagnostics from this hospitalization (including imaging, microbiology, ancillary and laboratory) are listed below for reference.     Microbiology: No results found for this or any previous visit (from the past 240 hours).   Labs: BNP (last 3 results) No results for input(s): "BNP" in the last 8760 hours. Basic Metabolic Panel: Recent Labs  Lab 02/27/24 1134 02/28/24 1102  NA 139 137  K 3.8 3.8  CL 105 102  CO2 25  --   GLUCOSE 237* 209*  BUN 11 10  CREATININE 1.47* 1.20  CALCIUM 9.4  --    Liver Function Tests: Recent Labs  Lab 02/27/24 1134  AST 25  ALT 29  ALKPHOS 86  BILITOT 0.7  PROT 7.1  ALBUMIN 4.0   No results for input(s): "LIPASE", "AMYLASE" in the last 168 hours. No results for input(s): "AMMONIA" in the last 168 hours. CBC: Recent Labs  Lab 02/27/24 1134 02/28/24 1102  WBC 5.9  --   NEUTROABS 3.0  --   HGB 13.1 13.9  HCT 40.2 41.0  MCV 92.2  --   PLT 175  --    Cardiac Enzymes: No results for input(s): "CKTOTAL", "CKMB",  "CKMBINDEX", "TROPONINI" in the last 168 hours. BNP: Invalid input(s): "POCBNP" CBG: Recent Labs  Lab 02/27/24 2321 02/28/24 0743 02/28/24 1154  GLUCAP 251* 149* 168*   D-Dimer No results for input(s): "DDIMER" in the last 72 hours. Hgb A1c Recent Labs  02/27/24 2315  HGBA1C 7.0*   Lipid Profile Recent Labs    02/27/24 2315  CHOL 97  HDL 35*  LDLCALC 49  TRIG 65  CHOLHDL 2.8   Thyroid  function studies No results for input(s): "TSH", "T4TOTAL", "T3FREE", "THYROIDAB" in the last 72 hours.  Invalid input(s): "FREET3" Anemia work up No results for input(s): "VITAMINB12", "FOLATE", "FERRITIN", "TIBC", "IRON", "RETICCTPCT" in the last 72 hours. Urinalysis    Component Value Date/Time   COLORURINE YELLOW 01/01/2021 1248   APPEARANCEUR CLEAR 01/01/2021 1248   LABSPEC 1.023 01/01/2021 1248   PHURINE 5.0 01/01/2021 1248   GLUCOSEU 50 (A) 01/01/2021 1248   HGBUR NEGATIVE 01/01/2021 1248   BILIRUBINUR NEGATIVE 01/01/2021 1248   KETONESUR NEGATIVE 01/01/2021 1248   PROTEINUR NEGATIVE 01/01/2021 1248   UROBILINOGEN 1.0 04/03/2008 2206   NITRITE NEGATIVE 01/01/2021 1248   LEUKOCYTESUR NEGATIVE 01/01/2021 1248   Sepsis Labs Recent Labs  Lab 02/27/24 1134  WBC 5.9   Microbiology No results found for this or any previous visit (from the past 240 hours).   Time coordinating discharge: Over 30 minutes  SIGNED:   Haydee Lipa, DO [facility] 02/28/2024, 12:20 PM Pager   If 7PM-7AM, please contact night-coverage [email or web address]

## 2024-02-28 NOTE — Progress Notes (Signed)
 PROGRESS NOTE    Brendan [name]  VWU:[id number] DOB: 1960/11/15 DOA: 02/27/2024 PCP: Default, Provider, MD   Brief Narrative:  Brendan [name] is a 63 y.o. male with medical history significant of coronary artery disease, non-insulin-dependent diabetes, hyperlipidemia, essential hypertension, chronic kidney disease stage IIIa, sarcoidosis, previous CVA, history of PFO, who presented to the ER with strokelike symptoms of right hand weakness/paresthesias. Outside window for TPA.  Assessment & Plan:   Principal Problem:   Acute CVA (cerebrovascular accident) (HCC) Active Problems:   Sarcoidosis   Reactive airway disease   GERD (gastroesophageal reflux disease)  Acute 5mm nonhemorrhagic infarct of the L precentral gyrus, POA -Imaging confirms acute CVA; as well as remote infarct of thalamus/occipital lobe -Neuro following - appreciate insight/recs -On single agent aspirin  at baseline - continue asa/plavix for 21 days, then transition to Plavix as single agent -A1C 7, HDL low at 35, lipid profile otherwise WNL - CTA head neck pending, echocardiogram confirms PFO   Diabetes, NIDDM2: Initiate sliding scale insulin. Hold home regimen Hyperlipidemia: Continue high intensity statin statins, on atorvastatin 80 at baseline Essential hypertension: Continue with blood pressure monitoring - permissive HTN as above CKD 3a: Monitor renal function closely, appears to be near baseline Coronary artery disease: Patient to remain on aspirin  and Plavix as above.  Asymptomatic at this point - no indication for further evaluation. Sarcoidosis: Defer to outpatient treatment.  DVT prophylaxis:    Code Status:   Code Status: Prior  Family Communication: Wife at bedside  Status is: Inpatient  Dispo: The patient is from: Home              Anticipated d/c is to: Home              Anticipated d/c date is: 24 to 48 hours              Patient currently not medically stable for  discharge  Consultants:  Neurology  Antimicrobials:  None indicated  Subjective: No acute issues or events overnight  Objective: Vitals:   02/28/24 0545 02/28/24 0600 02/28/24 0615 02/28/24 0621  BP: (!) 136/90 (!) 143/89 (!) 139/91   Pulse: 86 88 87   Resp:   18   Temp:    98 F (36.7 C)  TempSrc:    Oral  SpO2: 98% 97% 99%    No intake or output data in the 24 hours ending 02/28/24 0700 There were no vitals filed for this visit.  Examination:  General:  Pleasantly resting in bed, No acute distress. HEENT:  Normocephalic atraumatic.  Sclerae nonicteric, noninjected.  Extraocular movements intact bilaterally. Neck:  Without mass or deformity.  Trachea is midline. Lungs:  Clear to auscultate bilaterally without rhonchi, wheeze, or rales. Heart:  Regular rate and rhythm.  Without murmurs, rubs, or gallops. Abdomen:  Soft, nontender, nondistended.  Without guarding or rebound. Extremities: Without cyanosis, clubbing, edema, or obvious deformity. Skin:  Warm and dry, no erythema.   Data Reviewed: I have personally reviewed following labs and imaging studies  CBC: Recent Labs  Lab 02/27/24 1134  WBC 5.9  NEUTROABS 3.0  HGB 13.1  HCT 40.2  MCV 92.2  PLT 175   Basic Metabolic Panel: Recent Labs  Lab 02/27/24 1134  NA 139  K 3.8  CL 105  CO2 25  GLUCOSE 237*  BUN 11  CREATININE 1.47*  CALCIUM 9.4   GFR: CrCl cannot be calculated (Unknown ideal weight.). Liver Function Tests: Recent Labs  Lab 02/27/24 1134  AST 25  ALT 29  ALKPHOS 86  BILITOT 0.7  PROT 7.1  ALBUMIN 4.0   Coagulation Profile: Recent Labs  Lab 02/27/24 1134  INR 1.0   HbA1C: Recent Labs    02/27/24 2315  HGBA1C 7.0*   CBG: Recent Labs  Lab 02/27/24 2321  GLUCAP 251*   Lipid Profile: Recent Labs    02/27/24 2315  CHOL 97  HDL 35*  LDLCALC 49  TRIG 65  CHOLHDL 2.8   Thyroid  Function Tests: No results for input(s): "TSH", "T4TOTAL", "FREET4", "T3FREE",  "THYROIDAB" in the last 72 hours. Anemia Panel: No results for input(s): "VITAMINB12", "FOLATE", "FERRITIN", "TIBC", "IRON", "RETICCTPCT" in the last 72 hours. Sepsis Labs: No results for input(s): "PROCALCITON", "LATICACIDVEN" in the last 168 hours.  No results found for this or any previous visit (from the past 240 hours).       Radiology Studies: MR BRAIN WO CONTRAST Result Date: 02/27/2024 CLINICAL DATA:  Neuro deficit, acute, stroke suspected. Mental status change. Episode in which he was unable to use his right hand for approximately 15 minutes today. Symptoms have since resolved. EXAM: MRI HEAD WITHOUT CONTRAST TECHNIQUE: Multiplanar, multiecho pulse sequences of the brain and surrounding structures were obtained without intravenous contrast. COMPARISON:  CT head without contrast 02/27/2024. MR head without contrast 01/01/2021 FINDINGS: Brain: No acute infarct, hemorrhage, a 5 mm acute nonhemorrhagic infarct is present in the left precentral gyrus corresponding to the right hand. Focal T2 and FLAIR signal is associated. Periventricular and scattered subcortical T2 hyperintensities bilaterally are moderately advanced for age, slightly progressed since the prior exam. Remote lacunar infarcts are again noted within the right thalamus. A remote cortical infarct is present in the medial right occipital lobe. The ventricles are of normal size. No significant extraaxial fluid collection is present. The brainstem and cerebellum are within normal limits. Midline structures are within normal limits. Vascular: Flow is present in the major intracranial arteries. Skull and upper cervical spine: The craniocervical junction is normal. Upper cervical spine is within normal limits. Marrow signal is unremarkable. Sinuses/Orbits: The paranasal sinuses and mastoid air cells are clear. The globes and orbits are within normal limits. IMPRESSION: 1. 5 mm acute nonhemorrhagic infarct of the left precentral gyrus  corresponding to the right hand. 2. Periventricular and scattered subcortical T2 hyperintensities bilaterally are moderately advanced for age, slightly progressed since the prior exam. The finding is nonspecific but can be seen in the setting of chronic microvascular ischemia, a demyelinating process such as multiple sclerosis, vasculitis, complicated migraine headaches, or as the sequelae of a prior infectious or inflammatory process. 3. Remote lacunar infarcts of the right thalamus. 4. Remote cortical infarct of the medial right occipital lobe. These results were called by telephone at the time of interpretation on 02/27/2024 at 7:01 pm to provider DAVID YAO , who verbally acknowledged these results. Electronically Signed   By: Audree Leas M.D.   On: 02/27/2024 19:21   MR Cervical Spine Wo Contrast Result Date: 02/27/2024 CLINICAL DATA:  Myelopathy, acute, cervical spine. Right arm numbness. EXAM: MRI CERVICAL SPINE WITHOUT CONTRAST TECHNIQUE: Multiplanar, multisequence MR imaging of the cervical spine was performed. No intravenous contrast was administered. COMPARISON:  None Available. FINDINGS: Alignment: No significant listhesis is present. Straightening of the normal cervical lordosis is present. Vertebrae: Marrow signal and vertebral body heights are normal. Cord: Normal signal and morphology. Posterior Fossa, vertebral arteries, paraspinal tissues: Craniocervical junction is normal. Flow is present in the vertebral arteries bilaterally. Visualized intracranial contents are  normal. Disc levels: C2-3: Uncovertebral spurring is present bilaterally without significant stenosis. C3-4: A broad-based disc osteophyte complex effaces the ventral CSF. Moderate central and bilateral foraminal stenosis is present. Facet hypertrophy is worse on the left. C4-5: A broad-based disc osteophyte complex present. Uncovertebral and facet disease worse on the right. Moderate central canal stenosis is present. Severe  right and moderate left foraminal stenosis is present. C5-6: A broad-based disc osteophyte complex is present. Partial effacement of ventral CSF is noted. Uncovertebral spurring contributes to moderate foraminal stenosis bilaterally. C6-7: A broad-based disc osteophyte complex is present. Central canal is patent. Uncovertebral spurring contributes to moderate foraminal stenosis bilaterally. C7-T1: Negative. IMPRESSION: 1. Multilevel spondylosis of the cervical spine as described. 2. Moderate central canal stenosis at C3-4 and C4-5. 3. Severe right and moderate left foraminal stenosis at C4-5. 4. Moderate foraminal stenosis bilaterally at C5-6 and C6-7. Electronically Signed   By: Audree Leas M.D.   On: 02/27/2024 19:06   CT Head Wo Contrast Result Date: 02/27/2024 CLINICAL DATA:  Mental status change, unknown cause EXAM: CT HEAD WITHOUT CONTRAST TECHNIQUE: Contiguous axial images were obtained from the base of the skull through the vertex without intravenous contrast. RADIATION DOSE REDUCTION: This exam was performed according to the departmental dose-optimization program which includes automated exposure control, adjustment of the mA and/or kV according to patient size and/or use of iterative reconstruction technique. COMPARISON:  January 01, 2021 FINDINGS: Brain: The ventricles appear age appropriate. No mass effect or midline shift. Scattered periventricular white matter hypoattenuation, most consistent with changes of mild chronic ischemic microvascular disease. Small chronic lacunar infarcts in the right thalamus. no evidence of acute territorial infarction, extra-axial fluid collection, hemorrhage, or mass lesion. The basilar cisterns are patent without downward herniation. The cerebellar hemispheres and vermis are well formed without mass lesion or focal attenuation abnormality. No Vascular: No hyperdense vessel. Calcified atherosclerotic plaque within the cavernous/supraclinoid ICA and intradural  vertebral arteries. Skull: Normal. Negative for fracture or focal lesion. Sinuses/Orbits: The paranasal sinuses and mastoids are clear. The globes appear intact. No retrobulbar hematoma. Other: None. IMPRESSION: No acute intracranial abnormality, specifically, no acute hemorrhage, territorial infarction, or intracranial mass. Electronically Signed   By: Rance Burrows M.D.   On: 02/27/2024 14:05        Scheduled Meds:  aspirin  EC  81 mg Oral QHS   atorvastatin  80 mg Oral Daily   DULoxetine   30 mg Oral QHS   DULoxetine   60 mg Oral q AM   empagliflozin  10 mg Oral Daily   escitalopram   10 mg Oral QHS   ezetimibe  10 mg Oral Daily   insulin aspart  0-15 Units Subcutaneous TID WC   insulin aspart  0-5 Units Subcutaneous QHS   [START ON 03/01/2024] Vitamin D (Ergocalciferol)  50,000 Units Oral Q7 days   Continuous Infusions:   LOS: 1 day   Time spent:  Haydee Lipa, DO [facility]  If 7PM-7AM, please contact night-coverage [email or web address]  02/28/2024, 7:00 AM

## 2024-02-29 ENCOUNTER — Observation Stay (HOSPITAL_BASED_OUTPATIENT_CLINIC_OR_DEPARTMENT_OTHER)

## 2024-02-29 DIAGNOSIS — I69398 Other sequelae of cerebral infarction: Secondary | ICD-10-CM | POA: Diagnosis not present

## 2024-02-29 DIAGNOSIS — I639 Cerebral infarction, unspecified: Secondary | ICD-10-CM

## 2024-02-29 DIAGNOSIS — R29700 NIHSS score 0: Secondary | ICD-10-CM | POA: Diagnosis not present

## 2024-02-29 DIAGNOSIS — I69391 Dysphagia following cerebral infarction: Secondary | ICD-10-CM | POA: Diagnosis not present

## 2024-02-29 LAB — GLUCOSE, CAPILLARY
Glucose-Capillary: 137 mg/dL — ABNORMAL HIGH (ref 70–99)
Glucose-Capillary: 104 mg/dL — ABNORMAL HIGH (ref 70–99)

## 2024-02-29 MED ORDER — ASPIRIN 81 MG PO TBEC: 81.0000 mg | DELAYED_RELEASE_TABLET | Freq: Every day | ORAL | Status: DC

## 2024-02-29 MED ORDER — CLOPIDOGREL BISULFATE 75 MG PO TABS: 75.0000 mg | ORAL_TABLET | Freq: Every day | ORAL | 0 refills | Status: DC

## 2024-02-29 MED ORDER — PANTOPRAZOLE SODIUM 40 MG PO TBEC: 40.0000 mg | DELAYED_RELEASE_TABLET | Freq: Every day | ORAL | 0 refills | Status: DC

## 2024-02-29 NOTE — Plan of Care (Signed)
 Pt has rested quietly throughout the night with no distress noted. Alert and oriented. No new neuro deficits. On room air. SR on the monitor. Up to BR independently to void. No complaints voiced.    Problem: Tissue Perfusion: Goal: Adequacy of tissue perfusion will improve Outcome: Progressing   Problem: Education: Goal: Knowledge of General Education information will improve Description: Including pain rating scale, medication(s)/side effects and non-pharmacologic comfort measures Outcome: Progressing   Problem: Health Behavior/Discharge Planning: Goal: Ability to manage health-related needs will improve Outcome: Progressing   Problem: Clinical Measurements: Goal: Respiratory complications will improve Outcome: Progressing Goal: Cardiovascular complication will be avoided Outcome: Progressing   Problem: Pain Managment: Goal: General experience of comfort will improve and/or be controlled Outcome: Progressing

## 2024-02-29 NOTE — Progress Notes (Signed)
 STROKE TEAM PROGRESS NOTE   INTERIM HISTORY/SUBJECTIVE Patient is sitting up in bed comfortably.  He has no complaints.  His wife is at the bedside.  TCD bubble study done at the bedside shows no right-to-left shunt at rest but with Valsalva there is a full code need suggesting large shunt with Valsalva.  Will likely need outpatient TEE and 30 day heart monitor. Consider PFO closure in the future given this is his 3 rd event. Message sent to request these.  Check echocardiogram and lower extremity Dopplers and likely discharge home after this test OBJECTIVE  CBC    Component Value Date/Time   WBC 5.9 02/27/2024 1134   RBC 4.36 02/27/2024 1134   HGB 13.9 02/28/2024 1102   HGB 14.0 07/29/2022 1420   HCT 41.0 02/28/2024 1102   HCT 41.9 07/29/2022 1420   PLT 175 02/27/2024 1134   PLT 190 07/29/2022 1420   MCV 92.2 02/27/2024 1134   MCV 90 07/29/2022 1420   MCH 30.0 02/27/2024 1134   MCHC 32.6 02/27/2024 1134   RDW 13.3 02/27/2024 1134   RDW 12.6 07/29/2022 1420   LYMPHSABS 2.2 02/27/2024 1134   LYMPHSABS 1.8 07/29/2022 1420   MONOABS 0.4 02/27/2024 1134   EOSABS 0.3 02/27/2024 1134   EOSABS 0.3 07/29/2022 1420   BASOSABS 0.1 02/27/2024 1134   BASOSABS 0.0 07/29/2022 1420    BMET    Component Value Date/Time   NA 137 02/28/2024 1102   NA 141 07/29/2022 1420   K 3.8 02/28/2024 1102   CL 102 02/28/2024 1102   CO2 25 02/27/2024 1134   GLUCOSE 209 (H) 02/28/2024 1102   BUN 10 02/28/2024 1102   BUN 17 07/29/2022 1420   CREATININE 1.20 02/28/2024 1102   CALCIUM 9.4 02/27/2024 1134   EGFR 48 (L) 07/29/2022 1420   GFRNONAA 53 (L) 02/27/2024 1134    IMAGING past 24 hours VAS US  TRANSCRANIAL DOPPLER W BUBBLES Result Date: 02/29/2024  Transcranial Doppler with Bubble Patient Name:  Brendan [name]  Date of Exam:   02/29/2024 Medical Rec #: [id number]              Accession #:    [id number] Date of Birth: [birth year]              Patient Gender: M Patient Age:   7 years Exam  Location:  [facility] Procedure:      VAS US  TRANSCRANIAL DOPPLER W BUBBLES Referring Phys: DEVON SHAFER --------------------------------------------------------------------------------  Indications: Stroke. Comparison Study: No previous exams. PFO noted on echo at [facility] on 01/08/2021 Performing Technologist: Arlyce Berger RVT, RDMS  Examination Guidelines: A complete evaluation includes B-mode imaging, spectral Doppler, color Doppler, and power Doppler as needed of all accessible portions of each vessel. Bilateral testing is considered an integral part of a complete examination. Limited examinations for reoccurring indications may be performed as noted.  Summary:  A vascular evaluation was performed. The right middle cerebral artery was studied. An IV was inserted into the patient's left AC. Verbal informed consent was obtained.  No HITS heard at rest, however with valsalva there was a full curtain of HITS indicating a Spencer Grade 5 PFO. *See table(s) above for TCD measurements and observations.    Preliminary    VAS US  LOWER EXTREMITY VENOUS (DVT) Result Date: 02/29/2024  Lower Venous DVT Study Patient Name:  Brendan [name]  Date of Exam:   02/29/2024 Medical Rec #: [id number]  Accession #:    [id number] Date of Birth: [birth year]              Patient Gender: M Patient Age:   26 years Exam Location:  [facility] Procedure:      VAS US  LOWER EXTREMITY VENOUS (DVT) Referring Phys: Sudie Ely --------------------------------------------------------------------------------  Indications: Stroke.  Limitations: Poor ultrasound/tissue interface. Comparison Study: Previous exam on 01/23/2021 was negative for DVT Performing Technologist: Arlyce Berger RVT, RDMS  Examination Guidelines: A complete evaluation includes B-mode imaging, spectral Doppler, color Doppler, and power Doppler as needed of all accessible portions of each vessel. Bilateral testing is considered an integral part of a  complete examination. Limited examinations for reoccurring indications may be performed as noted. The reflux portion of the exam is performed with the patient in reverse Trendelenburg.  +--------+---------------+---------+-----------+----------+--------------------+ RIGHT   CompressibilityPhasicitySpontaneityPropertiesThrombus Aging       +--------+---------------+---------+-----------+----------+--------------------+ CFV     Full           Yes      Yes                                       +--------+---------------+---------+-----------+----------+--------------------+ SFJ     Full                                                              +--------+---------------+---------+-----------+----------+--------------------+ FV Prox Full           Yes      Yes                                       +--------+---------------+---------+-----------+----------+--------------------+ FV Mid  Full           Yes      Yes                                       +--------+---------------+---------+-----------+----------+--------------------+ FV      Full           Yes      Yes                                       Distal                                                                    +--------+---------------+---------+-----------+----------+--------------------+ PFV                    Yes      Yes                  patent by  color/doppler        +--------+---------------+---------+-----------+----------+--------------------+ POP     Full           Yes      Yes                                       +--------+---------------+---------+-----------+----------+--------------------+ PTV     Full                                                              +--------+---------------+---------+-----------+----------+--------------------+ PERO    Full                                                               +--------+---------------+---------+-----------+----------+--------------------+   +---------+---------------+---------+-----------+----------+--------------+ LEFT     CompressibilityPhasicitySpontaneityPropertiesThrombus Aging +---------+---------------+---------+-----------+----------+--------------+ CFV      Full           Yes      Yes                                 +---------+---------------+---------+-----------+----------+--------------+ SFJ      Full                                                        +---------+---------------+---------+-----------+----------+--------------+ FV Prox  Full           Yes      Yes                                 +---------+---------------+---------+-----------+----------+--------------+ FV Mid   Full           Yes      Yes                                 +---------+---------------+---------+-----------+----------+--------------+ FV DistalFull           Yes      Yes                                 +---------+---------------+---------+-----------+----------+--------------+ PFV      Full                                                        +---------+---------------+---------+-----------+----------+--------------+ POP      Full           Yes      Yes                                 +---------+---------------+---------+-----------+----------+--------------+  PTV      Full                                                        +---------+---------------+---------+-----------+----------+--------------+ PERO     Full                                                        +---------+---------------+---------+-----------+----------+--------------+    Summary: BILATERAL: - No evidence of deep vein thrombosis seen in the lower extremities, bilaterally. -No evidence of popliteal cyst, bilaterally.   *See table(s) above for measurements and observations.    Preliminary     Vitals:   02/28/24  1555 02/28/24 1945 02/29/24 0000 02/29/24 0733  BP: 134/83 137/71 137/89 128/81  Pulse: 81 85 84 80  Resp: 15 19 18 17   Temp: 98.4 F (36.9 C) (!) 97.4 F (36.3 C) 97.6 F (36.4 C) 97.6 F (36.4 C)  TempSrc: Oral Oral Oral Oral  SpO2: 95% 96% 97% 96%     PHYSICAL EXAM General:  Alert, well-nourished, well-developed middle-aged African-American male in no acute distress Psych:  Mood and affect appropriate for situation CV: Regular rate and rhythm on monitor Respiratory:  Regular, unlabored respirations on room air GI: Abdomen soft and nontender   NEURO:  Mental Status: AA&Ox3, patient is able to give clear and coherent history Speech/Language: speech is without dysarthria or aphasia.  Naming, repetition, fluency, and comprehension intact.  Cranial Nerves:  II: PERRL. Visual fields full.  III, IV, VI: EOMI. Eyelids elevate symmetrically.  V: Sensation is intact to light touch and symmetrical to face. Residual left numbness VII: Face is symmetrical resting and smiling VIII: hearing intact to voice. IX, X: Palate elevates symmetrically. Phonation is normal.  BJ:YNWGNFAO shrug 5/5. XII: tongue is midline without fasciculations. Motor: 5/5 strength to all muscle groups tested.  Tone: is normal and bulk is normal Sensation- Intact to light touch bilaterally. Extinction absent to light touch to DSS.  Residual left numbness Coordination: FTN intact bilaterally, HKS: no ataxia in BLE.No drift.  Gait- deferred  Most Recent NIH 0    ASSESSMENT/PLAN  Brendan [name] is a 63 y.o. male with history of CAD on aspirin , diabetes, hypertension, hyperlipidemia, sarcoidosis, history of PFO documented in the neurology note from 08/06/2023 office visit with Dr. Mira Amend at [facility], prior stroke where no acute imaging was done for left-sided numbness of face arm and leg after a knee surgery but later MRI with concern for chronic right thalamic lacunar infarct with mild  residual paresthesias on the left side, now presents for evaluation of difficulty with writing with the right hand.  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   CTA head & neck Moderate stenosis of the distal anterior and inferior left M2 segment. MRI  Punctate cortical infarct-left precentral gyrus-likely hand knob infarct remote age small right occipital cortex and right thalamic infarcts Venous duplex DVT -no evidence of DVT TCD Bubble study  -positive for large right-to-left shunt with Valsalva only 2D Echo - pending  LDL 49 HgbA1c 7.0 VTE prophylaxis - SCDs aspirin  81 mg daily prior to admission, now on aspirin  81 mg daily and clopidogrel 75 mg daily for 3 weeks and then plavix alone. 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 Yes; Fluid consistency: Thin   Advance diet as tolerated  Other Stroke Risk Factors Coronary artery disease- on ASA 81mg  already  Hospital day # 1   Patient presented with 15-minute episode of right hand weakness with MRI scan showing small embolic left parietal infarct involving hand area.  Etiology of this likely embolic of cryptogenic source.  Patient with recurrent stroke as he had previous history of  thalamic infarcts following knee surgery and there was a silent right occipital infarct noted in the left time.  He will need prolonged cardiac monitoring for paroxysmal A-fib.  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    Recommend aspirin  and Plavix for 3 weeks followed by Plavix alone and aggressive risk factor modification.  Long discussion with patient and wife at the bedside and answered questions.  Discussed possible participation in [place] stroke prevention study but patient declined. Greater than 50% time during this 35-minute visit was spent in counseling and coordination of care about his cryptogenic stroke and discussion about further evaluation and treatment and answering questions.  Discussed with Dr. Marcie Sever, MD Medical Director [facility] Pager: [phone] 02/29/2024 11:55 AM    To contact Stroke Continuity provider, please refer to [email or web address]. After hours, contact General Neurology

## 2024-02-29 NOTE — Discharge Instructions (Signed)
Follow with Primary MD   Activity: As tolerated with Full fall precautions use walker/cane & assistance as needed   Disposition Home    Diet: Heart Healthy    On your next visit with your primary care physician please Get Medicines reviewed and adjusted.   Please request your Prim.MD to go over all Hospital Tests and Procedure/Radiological results at the follow up, please get all Hospital records sent to your Prim MD by signing hospital release before you go home.   If you experience worsening of your admission symptoms, develop shortness of breath, life threatening emergency, suicidal or homicidal thoughts you must seek medical attention immediately by calling 911 or calling your MD immediately  if symptoms less severe.  You Must read complete instructions/literature along with all the possible adverse reactions/side effects for all the Medicines you take and that have been prescribed to you. Take any new Medicines after you have completely understood and accpet all the possible adverse reactions/side effects.   Do not drive, operating heavy machinery, perform activities at heights, swimming or participation in water activities or provide baby sitting services if your were admitted for syncope or siezures until you have seen by Primary MD or a Neurologist and advised to do so again.  Do not drive when taking Pain medications.    Do not take more than prescribed Pain, Sleep and Anxiety Medications  Special Instructions: If you have smoked or chewed Tobacco  in the last 2 yrs please stop smoking, stop any regular Alcohol  and or any Recreational drug use.  Wear Seat belts while driving.   Please note  You were cared for by a hospitalist during your hospital stay. If you have any questions about your discharge medications or the care you received while you were in the hospital after you are discharged, you can call the unit and asked to speak with the hospitalist on call if the  hospitalist that took care of you is not available. Once you are discharged, your primary care physician will handle any further medical issues. Please note that NO REFILLS for any discharge medications will be authorized once you are discharged, as it is imperative that you return to your primary care physician (or establish a relationship with a primary care physician if you do not have one) for your aftercare needs so that they can reassess your need for medications and monitor your lab values.

## 2024-02-29 NOTE — Progress Notes (Signed)
 BLE venous duplex and TCD bubble study have been completed.  Results can be found under chart review under CV PROC. 02/29/2024 11:59 AM Kasim Mccorkle RVT, RDMS

## 2024-02-29 NOTE — Discharge Summary (Signed)
 Physician Discharge Summary  DINK TILTON ZOX:[id number] DOB: 1961/10/02 DOA: 02/27/2024  PCP: Default, Provider, MD  Admit date: 02/27/2024 Discharge date: 02/29/2024  Admitted From: (Home) Disposition:  (Home)  Recommendations for Outpatient Follow-up:  Follow up with PCP in 1-2 weeks Please obtain BMP/CBC in one week Please follow up your cardiology as an outpatient, as you will need TEE, to decide if will need endovascular PFO closure and will need 30 days heart monitor, message has been sent to cardiology coordinator to arrange. Patient to be on aspirin  and Plavix on 3 weeks then Plavix alone   Diet recommendation: Heart Healthy   Brief/Interim Summary: 63 year old male with past medical history of CAD, on aspirin , diabetes, hypertension, hyperlipidemia, sarcoidosis, history of PFO documented by neurology note as an outpatient in 2024, following with neurology Dr. Shaun [facility] creatinine as well, previous CVA, patient presents to ED secondary to complaints of right hand weakness, workup in ED significant for acute CVA, admitted for further workup.  Acute CVA - Workup significant for left precentral gyrus infarct, neurology input appreciated, felt very likely due to embolic source, likely cryptogenic. -CTA head & neck Moderate stenosis of the distal anterior and inferior left M2 segment. -MRI  Punctate cortical infarct-left precentral gyrus-likely hand knob infarct remote age small right occipital cortex and right thalamic infarcts -Venous Dopplers with no evidence of DVT venous duplex DVT  -TCD Bubble study  -was positive for PFO -No need to repeat 2D echo as discussed with neurology as just had 1 done in outpatient setting recently -DL well-controlled at 49, A1c is acceptable at 7 -On aspirin  prior to admission, recommendation by neurology to continue with aspirin  81 mg and Plavix 75 mg daily for 3 weeks then Plavix alone. - Will need 30 days heart monitor.   Hx  of Stroke/TIA -Stroke after knee surgery, he was on aspirin    Hx of PFO -TCD bubble study was positive as discussed with Dr. Creighton Doffing, venous Doppler has been negative, as discussed with neurology, he will need to follow-up with cardiology as an outpatient regarding TEE to decide if he needs endovascular PFO closure    Hypertension - continue  with Norvasc  Hyperlipidemia - LDL is acceptable at 49, continue with    Diabetes type II Controlled -A1c acceptable at 7, continue with home medication including Jardiance and semaglutide   Discharge Diagnoses:  Principal Problem:   Acute CVA (cerebrovascular accident) (HCC) Active Problems:   Sarcoidosis   Reactive airway disease   GERD (gastroesophageal reflux disease)    Discharge Instructions  Discharge Instructions     Diet - low sodium heart healthy   Complete by: As directed    Discharge instructions   Complete by: As directed    Follow with Primary MD   Activity: As tolerated with Full fall precautions use walker/cane & assistance as needed Disposition Home    Diet: Heart Healthy   On your next visit with your primary care physician please Get Medicines reviewed and adjusted.   Please request your Prim.MD to go over all Hospital Tests and Procedure/Radiological results at the follow up, please get all Hospital records sent to your Prim MD by signing hospital release before you go home.   If you experience worsening of your admission symptoms, develop shortness of breath, life threatening emergency, suicidal or homicidal thoughts you must seek medical attention immediately by calling 911 or calling your MD immediately  if symptoms less severe.  You Must read complete instructions/literature along with  all the possible adverse reactions/side effects for all the Medicines you take and that have been prescribed to you. Take any new Medicines after you have completely understood and accpet all the possible adverse  reactions/side effects.   Do not drive, operating heavy machinery, perform activities at heights, swimming or participation in water activities or provide baby sitting services if your were admitted for syncope or siezures until you have seen by Primary MD or a Neurologist and advised to do so again.  Do not drive when taking Pain medications.    Do not take more than prescribed Pain, Sleep and Anxiety Medications  Special Instructions: If you have smoked or chewed Tobacco  in the last 2 yrs please stop smoking, stop any regular Alcohol  and or any Recreational drug use.  Wear Seat belts while driving.   Please note  You were cared for by a hospitalist during your hospital stay. If you have any questions about your discharge medications or the care you received while you were in the hospital after you are discharged, you can call the unit and asked to speak with the hospitalist on call if the hospitalist that took care of you is not available. Once you are discharged, your primary care physician will handle any further medical issues. Please note that NO REFILLS for any discharge medications will be authorized once you are discharged, as it is imperative that you return to your primary care physician (or establish a relationship with a primary care physician if you do not have one) for your aftercare needs so that they can reassess your need for medications and monitor your lab values.   Increase activity slowly   Complete by: As directed       Allergies as of 02/29/2024       Reactions   Latex Swelling   Lisinopril Swelling   Losartan Swelling   Metformin Nausea Only   Sildenafil Palpitations   Other reaction(s): Headache, Palpitation, Headache, Palpitation, Headache, Palpitations        Medication List     STOP taking these medications    tadalafil 20 MG tablet Commonly known as: CIALIS       TAKE these medications    albuterol 108 (90 Base) MCG/ACT inhaler Commonly  known as: VENTOLIN HFA Inhale 1-2 puffs into the lungs every 6 (six) hours as needed for shortness of breath or wheezing.   amLODipine 10 MG tablet Commonly known as: NORVASC Take 1 tablet by mouth daily.   amoxicillin  500 MG capsule Commonly known as: AMOXIL  TAKE FOUR CAPSULES BY MOUTH ONCE 1 HOUR PRIOR TO THE DENTAL TREATMENT   aspirin  EC 81 MG tablet Take 1 tablet (81 mg total) by mouth at bedtime for 19 days. Swallow whole.   atorvastatin 80 MG tablet Commonly known as: LIPITOR Take 80 mg by mouth daily.   clobetasol cream 0.05 % Commonly known as: TEMOVATE   clopidogrel 75 MG tablet Commonly known as: PLAVIX Take 1 tablet (75 mg total) by mouth daily. Start taking on: March 31, 2024   diazepam 5 MG tablet Commonly known as: VALIUM Take 5 mg by mouth daily as needed for anxiety.   diclofenac Sodium 1 % Gel Commonly known as: VOLTAREN Apply 2 g topically as needed (pain).   DULoxetine  30 MG capsule Commonly known as: CYMBALTA  Take 30-60 mg by mouth 2 (two) times daily. Take 60 mg in the morning and 30 mg at night   empagliflozin 25 MG Tabs tablet Commonly known  as: JARDIANCE Take 0.5 tablets by mouth daily.   EPINEPHrine  0.3 mg/0.3 mL Soaj injection Commonly known as: EPI-PEN Inject 0.3 mg into the muscle as needed for anaphylaxis.   escitalopram  10 MG tablet Commonly known as: LEXAPRO  Take 10 mg by mouth at bedtime.   ezetimibe 10 MG tablet Commonly known as: ZETIA Take 10 mg by mouth daily.   hydrOXYzine 25 MG tablet Commonly known as: ATARAX Take 25 mg by mouth 3 (three) times daily.   mometasone -formoterol  100-5 MCG/ACT Aero Commonly known as: DULERA Inhale 2 puffs into the lungs in the morning and at bedtime.   pantoprazole 40 MG tablet Commonly known as: Protonix Take 1 tablet (40 mg total) by mouth daily.   Semaglutide(0.25 or 0.5MG /DOS) 2 MG/3ML Sopn Inject 0.5 mg into the skin once a week. sunday   traZODone 100 MG tablet Commonly known  as: DESYREL Take 100 mg by mouth at bedtime.   Vitamin D (Ergocalciferol) 1.25 MG (50000 UNIT) Caps capsule Commonly known as: DRISDOL Take 50,000 Units by mouth every 7 (seven) days. monday   zolpidem 10 MG tablet Commonly known as: AMBIEN Take 10 mg by mouth at bedtime.        Allergies  Allergen Reactions   Latex Swelling   Lisinopril Swelling   Losartan Swelling   Metformin Nausea Only   Sildenafil Palpitations    Other reaction(s): Headache, Palpitation, Headache, Palpitation, Headache, Palpitations    Consultations: Neurology   Procedures/Studies: VAS US  LOWER EXTREMITY VENOUS (DVT) Result Date: 02/29/2024  Lower Venous DVT Study Patient Name:  ALVAR HUSCHER  Date of Exam:   02/29/2024 Medical Rec #: [id number]              Accession #:    [id number] Date of Birth: November 13, 1960              Patient Gender: M Patient Age:   28 years Exam Location:  [facility] Procedure:      VAS US  LOWER EXTREMITY VENOUS (DVT) Referring Phys: Sudie Ely --------------------------------------------------------------------------------  Indications: Stroke.  Limitations: Poor ultrasound/tissue interface. Comparison Study: Previous exam on 01/23/2021 was negative for DVT Performing Technologist: Arlyce Berger RVT, RDMS  Examination Guidelines: A complete evaluation includes B-mode imaging, spectral Doppler, color Doppler, and power Doppler as needed of all accessible portions of each vessel. Bilateral testing is considered an integral part of a complete examination. Limited examinations for reoccurring indications may be performed as noted. The reflux portion of the exam is performed with the patient in reverse Trendelenburg.  +--------+---------------+---------+-----------+----------+--------------------+ RIGHT   CompressibilityPhasicitySpontaneityPropertiesThrombus Aging       +--------+---------------+---------+-----------+----------+--------------------+ CFV     Full            Yes      Yes                                       +--------+---------------+---------+-----------+----------+--------------------+ SFJ     Full                                                              +--------+---------------+---------+-----------+----------+--------------------+ FV Prox Full           Yes      Yes                                       +--------+---------------+---------+-----------+----------+--------------------+  FV Mid  Full           Yes      Yes                                       +--------+---------------+---------+-----------+----------+--------------------+ FV      Full           Yes      Yes                                       Distal                                                                    +--------+---------------+---------+-----------+----------+--------------------+ PFV                    Yes      Yes                  patent by                                                                 color/doppler        +--------+---------------+---------+-----------+----------+--------------------+ POP     Full           Yes      Yes                                       +--------+---------------+---------+-----------+----------+--------------------+ PTV     Full                                                              +--------+---------------+---------+-----------+----------+--------------------+ PERO    Full                                                              +--------+---------------+---------+-----------+----------+--------------------+   +---------+---------------+---------+-----------+----------+--------------+ LEFT     CompressibilityPhasicitySpontaneityPropertiesThrombus Aging +---------+---------------+---------+-----------+----------+--------------+ CFV      Full           Yes      Yes                                  +---------+---------------+---------+-----------+----------+--------------+ SFJ      Full                                                        +---------+---------------+---------+-----------+----------+--------------+  FV Prox  Full           Yes      Yes                                 +---------+---------------+---------+-----------+----------+--------------+ FV Mid   Full           Yes      Yes                                 +---------+---------------+---------+-----------+----------+--------------+ FV DistalFull           Yes      Yes                                 +---------+---------------+---------+-----------+----------+--------------+ PFV      Full                                                        +---------+---------------+---------+-----------+----------+--------------+ POP      Full           Yes      Yes                                 +---------+---------------+---------+-----------+----------+--------------+ PTV      Full                                                        +---------+---------------+---------+-----------+----------+--------------+ PERO     Full                                                        +---------+---------------+---------+-----------+----------+--------------+     Summary: BILATERAL: - No evidence of deep vein thrombosis seen in the lower extremities, bilaterally. -No evidence of popliteal cyst, bilaterally.   *See table(s) above for measurements and observations. Electronically signed by Delaney Fearing on 02/29/2024 at 12:26:40 PM.    Final    VAS US  TRANSCRANIAL DOPPLER W BUBBLES Result Date: 02/29/2024  Transcranial Doppler with Bubble Patient Name:  ODDIE HECKLE  Date of Exam:   02/29/2024 Medical Rec #: [id number]              Accession #:    [id number] Date of Birth: April 26, 1961              Patient Gender: M Patient Age:   64 years Exam Location:  [facility] Procedure:      VAS US   TRANSCRANIAL DOPPLER W BUBBLES Referring Phys: DEVON SHAFER --------------------------------------------------------------------------------  Indications: Stroke. Comparison Study: No previous TCD exams, however echo at [facility] on 01/08/2021 was                   positive for PFO Performing Technologist: Arlyce Berger RVT, RDMS  Examination Guidelines: A complete evaluation includes B-mode imaging, spectral Doppler, color Doppler, and power Doppler as needed of all accessible portions of each vessel. Bilateral testing is considered an integral part of a complete examination. Limited examinations for reoccurring indications may be performed as noted.  Summary:  A vascular evaluation was performed. The right middle cerebral artery was studied. An IV was inserted into the patient's left AC. Verbal informed consent was obtained.  No HITS heard at rest, however with valsalva a full curtain of HITS was heard indicating a Spencer Grade 5 PFO *See table(s) above for TCD measurements and observations.    Preliminary    CT ANGIO HEAD NECK W WO CM Result Date: 02/28/2024 CLINICAL DATA:  Stroke follow-up. Acute cortical infarct involving the left precentral gyrus. Abnormal function of the right hand. EXAM: CT ANGIOGRAPHY HEAD AND NECK WITH AND WITHOUT CONTRAST TECHNIQUE: Multidetector CT imaging of the head and neck was performed using the standard protocol during bolus administration of intravenous contrast. Multiplanar CT image reconstructions and MIPs were obtained to evaluate the vascular anatomy. Carotid stenosis measurements (when applicable) are obtained utilizing NASCET criteria, using the distal internal carotid diameter as the denominator. RADIATION DOSE REDUCTION: This exam was performed according to the departmental dose-optimization program which includes automated exposure control, adjustment of the mA and/or kV according to patient size and/or use of iterative reconstruction technique. CONTRAST:  OMNIPAQUE   IOHEXOL  350 MG/ML SOLN COMPARISON:  CT head without contrast and MR head without contrast 02/27/2024 FINDINGS: CT HEAD FINDINGS Brain: The left precentral gyrus infarct is stable. Remote lacunar infarcts are again noted in the right thalamus. No other acute intracranial abnormality is present. No acute hemorrhage or other mass lesion is present. Mild white matter changes are stable. The ventricles are of normal size. No significant extraaxial fluid collection is present. The brainstem and cerebellum are within normal limits. Vascular: Atherosclerotic calcifications are present within the cavernous internal carotid arteries bilaterally. Hyperdense vessel is present. Skull: Calvarium is intact. No focal lytic or blastic lesions are present. No significant extracranial soft tissue lesion is present. Sinuses/Orbits: The paranasal sinuses and mastoid air cells are clear. The globes and orbits are within normal limits. Review of the MIP images confirms the above findings CTA NECK FINDINGS Aortic arch: A 3 vessel arch configuration is present. Minimal calcifications are present in the distal arch. Great vessel origins are widely patent. Right carotid system: The right common carotid artery is within normal limits. Minimal calcification is present at the right carotid bifurcation without significant stenosis. The cervical right ICA is otherwise normal. Left carotid system: The left common carotid artery is within normal limits. Minimal calcifications present bifurcation and proximal left ICA without significant stenosis. Cervical left ICA is otherwise normal. Vertebral arteries: The left vertebral artery is the dominant vessel. Both vertebral arteries originate from the subclavian arteries without significant stenosis. No significant stenosis is present in either vertebral artery in neck. Skeleton: Endplate changes and right greater than left uncovertebral spurring is noted in the cervical spine. No significant listhesis is  present. Straightening of the normal cervical lordosis is present. Other neck: The soft tissues of the neck are unremarkable. Salivary glands are within normal limits. Thyroid  is normal. No significant adenopathy is present. No focal mucosal or submucosal lesions are present. Upper chest: The lung apices are clear. The thoracic inlet is within normal limits. Review of the MIP images confirms the above findings CTA HEAD FINDINGS Anterior circulation: Calcifications are present within the cavernous  internal carotid arteries bilaterally without significant stenosis through the ICA termini. A1 and M1 segments are normal. Anterior communicating artery is patent. MCA bifurcations are normal bilaterally moderate stenosis is present in distal anterior and inferior left M2 segment. The ACA and MCA branch vessels are otherwise within normal limits. No aneurysm present. Posterior circulation: Atherosclerotic changes are present the dural margin of the left vertebral artery without significant stenosis relative to distal vessels. The vertebrobasilar junction basilar artery is normal. The left AICA is dominant. The superior cerebellar arteries patent bilaterally. Posterior cerebral arteries originate the basilar tip. The PCA branch vessels are normal bilaterally. No aneurysm is present. Venous sinuses: The dural sinuses are patent. The straight sinus and deep cerebral veins are intact. Cortical veins are within normal limits. No significant vascular malformation is evident. Anatomic variants: None Review of the MIP images confirms the above findings IMPRESSION: 1. Stable left precentral gyrus infarct. 2. Remote lacunar infarcts of the right thalamus. 3. Mild calcification at the carotid bifurcations bilaterally without significant stenosis. 4. Moderate stenosis of the distal anterior and inferior left M2 segment. 5. No other significant proximal stenosis, aneurysm, or branch vessel occlusion within the Circle of Willis. 6.  Minimal atherosclerotic changes at the carotid bifurcations bilaterally without significant stenosis. CTA neck is otherwise unremarkable. 7. Multilevel spondylosis of the cervical spine. Electronically Signed   By: Audree Leas M.D.   On: 02/28/2024 11:21   MR BRAIN WO CONTRAST Result Date: 02/27/2024 CLINICAL DATA:  Neuro deficit, acute, stroke suspected. Mental status change. Episode in which he was unable to use his right hand for approximately 15 minutes today. Symptoms have since resolved. EXAM: MRI HEAD WITHOUT CONTRAST TECHNIQUE: Multiplanar, multiecho pulse sequences of the brain and surrounding structures were obtained without intravenous contrast. COMPARISON:  CT head without contrast 02/27/2024. MR head without contrast 01/01/2021 FINDINGS: Brain: No acute infarct, hemorrhage, a 5 mm acute nonhemorrhagic infarct is present in the left precentral gyrus corresponding to the right hand. Focal T2 and FLAIR signal is associated. Periventricular and scattered subcortical T2 hyperintensities bilaterally are moderately advanced for age, slightly progressed since the prior exam. Remote lacunar infarcts are again noted within the right thalamus. A remote cortical infarct is present in the medial right occipital lobe. The ventricles are of normal size. No significant extraaxial fluid collection is present. The brainstem and cerebellum are within normal limits. Midline structures are within normal limits. Vascular: Flow is present in the major intracranial arteries. Skull and upper cervical spine: The craniocervical junction is normal. Upper cervical spine is within normal limits. Marrow signal is unremarkable. Sinuses/Orbits: The paranasal sinuses and mastoid air cells are clear. The globes and orbits are within normal limits. IMPRESSION: 1. 5 mm acute nonhemorrhagic infarct of the left precentral gyrus corresponding to the right hand. 2. Periventricular and scattered subcortical T2 hyperintensities  bilaterally are moderately advanced for age, slightly progressed since the prior exam. The finding is nonspecific but can be seen in the setting of chronic microvascular ischemia, a demyelinating process such as multiple sclerosis, vasculitis, complicated migraine headaches, or as the sequelae of a prior infectious or inflammatory process. 3. Remote lacunar infarcts of the right thalamus. 4. Remote cortical infarct of the medial right occipital lobe. These results were called by telephone at the time of interpretation on 02/27/2024 at 7:01 pm to provider DAVID YAO , who verbally acknowledged these results. Electronically Signed   By: Audree Leas M.D.   On: 02/27/2024 19:21   MR Cervical Spine Wo  Contrast Result Date: 02/27/2024 CLINICAL DATA:  Myelopathy, acute, cervical spine. Right arm numbness. EXAM: MRI CERVICAL SPINE WITHOUT CONTRAST TECHNIQUE: Multiplanar, multisequence MR imaging of the cervical spine was performed. No intravenous contrast was administered. COMPARISON:  None Available. FINDINGS: Alignment: No significant listhesis is present. Straightening of the normal cervical lordosis is present. Vertebrae: Marrow signal and vertebral body heights are normal. Cord: Normal signal and morphology. Posterior Fossa, vertebral arteries, paraspinal tissues: Craniocervical junction is normal. Flow is present in the vertebral arteries bilaterally. Visualized intracranial contents are normal. Disc levels: C2-3: Uncovertebral spurring is present bilaterally without significant stenosis. C3-4: A broad-based disc osteophyte complex effaces the ventral CSF. Moderate central and bilateral foraminal stenosis is present. Facet hypertrophy is worse on the left. C4-5: A broad-based disc osteophyte complex present. Uncovertebral and facet disease worse on the right. Moderate central canal stenosis is present. Severe right and moderate left foraminal stenosis is present. C5-6: A broad-based disc osteophyte complex is  present. Partial effacement of ventral CSF is noted. Uncovertebral spurring contributes to moderate foraminal stenosis bilaterally. C6-7: A broad-based disc osteophyte complex is present. Central canal is patent. Uncovertebral spurring contributes to moderate foraminal stenosis bilaterally. C7-T1: Negative. IMPRESSION: 1. Multilevel spondylosis of the cervical spine as described. 2. Moderate central canal stenosis at C3-4 and C4-5. 3. Severe right and moderate left foraminal stenosis at C4-5. 4. Moderate foraminal stenosis bilaterally at C5-6 and C6-7. Electronically Signed   By: Audree Leas M.D.   On: 02/27/2024 19:06   CT Head Wo Contrast Result Date: 02/27/2024 CLINICAL DATA:  Mental status change, unknown cause EXAM: CT HEAD WITHOUT CONTRAST TECHNIQUE: Contiguous axial images were obtained from the base of the skull through the vertex without intravenous contrast. RADIATION DOSE REDUCTION: This exam was performed according to the departmental dose-optimization program which includes automated exposure control, adjustment of the mA and/or kV according to patient size and/or use of iterative reconstruction technique. COMPARISON:  January 01, 2021 FINDINGS: Brain: The ventricles appear age appropriate. No mass effect or midline shift. Scattered periventricular white matter hypoattenuation, most consistent with changes of mild chronic ischemic microvascular disease. Small chronic lacunar infarcts in the right thalamus. no evidence of acute territorial infarction, extra-axial fluid collection, hemorrhage, or mass lesion. The basilar cisterns are patent without downward herniation. The cerebellar hemispheres and vermis are well formed without mass lesion or focal attenuation abnormality. No Vascular: No hyperdense vessel. Calcified atherosclerotic plaque within the cavernous/supraclinoid ICA and intradural vertebral arteries. Skull: Normal. Negative for fracture or focal lesion. Sinuses/Orbits: The paranasal  sinuses and mastoids are clear. The globes appear intact. No retrobulbar hematoma. Other: None. IMPRESSION: No acute intracranial abnormality, specifically, no acute hemorrhage, territorial infarction, or intracranial mass. Electronically Signed   By: Rance Burrows M.D.   On: 02/27/2024 14:05    Subjective: Reports no new focal deficits, feels his right hand weakness has resolved, back to baseline  Discharge Exam: Vitals:   02/29/24 0000 02/29/24 0733  BP: 137/89 128/81  Pulse: 84 80  Resp: 18 17  Temp: 97.6 F (36.4 C) 97.6 F (36.4 C)  SpO2: 97% 96%   Vitals:   02/28/24 1555 02/28/24 1945 02/29/24 0000 02/29/24 0733  BP: 134/83 137/71 137/89 128/81  Pulse: 81 85 84 80  Resp: 15 19 18 17   Temp: 98.4 F (36.9 C) (!) 97.4 F (36.3 C) 97.6 F (36.4 C) 97.6 F (36.4 C)  TempSrc: Oral Oral Oral Oral  SpO2: 95% 96% 97% 96%    General: Pt is  alert, awake, not in acute distress Cardiovascular: RRR, S1/S2 +, no rubs, no gallops Respiratory: CTA bilaterally, no wheezing, no rhonchi Abdominal: Soft, NT, ND, bowel sounds + Extremities: no edema, no cyanosis    The results of significant diagnostics from this hospitalization (including imaging, microbiology, ancillary and laboratory) are listed below for reference.     Microbiology: No results found for this or any previous visit (from the past 240 hours).   Labs: BNP (last 3 results) No results for input(s): "BNP" in the last 8760 hours. Basic Metabolic Panel: Recent Labs  Lab 02/27/24 1134 02/28/24 1102  NA 139 137  K 3.8 3.8  CL 105 102  CO2 25  --   GLUCOSE 237* 209*  BUN 11 10  CREATININE 1.47* 1.20  CALCIUM 9.4  --    Liver Function Tests: Recent Labs  Lab 02/27/24 1134  AST 25  ALT 29  ALKPHOS 86  BILITOT 0.7  PROT 7.1  ALBUMIN 4.0   No results for input(s): "LIPASE", "AMYLASE" in the last 168 hours. No results for input(s): "AMMONIA" in the last 168 hours. CBC: Recent Labs  Lab 02/27/24 1134  02/28/24 1102  WBC 5.9  --   NEUTROABS 3.0  --   HGB 13.1 13.9  HCT 40.2 41.0  MCV 92.2  --   PLT 175  --    Cardiac Enzymes: No results for input(s): "CKTOTAL", "CKMB", "CKMBINDEX", "TROPONINI" in the last 168 hours. BNP: Invalid input(s): "POCBNP" CBG: Recent Labs  Lab 02/28/24 1154 02/28/24 1637 02/28/24 2133 02/29/24 0733 02/29/24 1254  GLUCAP 168* 175* 96 137* 104*   D-Dimer No results for input(s): "DDIMER" in the last 72 hours. Hgb A1c Recent Labs    02/27/24 2315  HGBA1C 7.0*   Lipid Profile Recent Labs    02/27/24 2315  CHOL 97  HDL 35*  LDLCALC 49  TRIG 65  CHOLHDL 2.8   Thyroid  function studies No results for input(s): "TSH", "T4TOTAL", "T3FREE", "THYROIDAB" in the last 72 hours.  Invalid input(s): "FREET3" Anemia work up No results for input(s): "VITAMINB12", "FOLATE", "FERRITIN", "TIBC", "IRON", "RETICCTPCT" in the last 72 hours. Urinalysis    Component Value Date/Time   COLORURINE YELLOW 01/01/2021 1248   APPEARANCEUR CLEAR 01/01/2021 1248   LABSPEC 1.023 01/01/2021 1248   PHURINE 5.0 01/01/2021 1248   GLUCOSEU 50 (A) 01/01/2021 1248   HGBUR NEGATIVE 01/01/2021 1248   BILIRUBINUR NEGATIVE 01/01/2021 1248   KETONESUR NEGATIVE 01/01/2021 1248   PROTEINUR NEGATIVE 01/01/2021 1248   UROBILINOGEN 1.0 04/03/2008 2206   NITRITE NEGATIVE 01/01/2021 1248   LEUKOCYTESUR NEGATIVE 01/01/2021 1248   Sepsis Labs Recent Labs  Lab 02/27/24 1134  WBC 5.9   Microbiology No results found for this or any previous visit (from the past 240 hours).   Time coordinating discharge: Over 30 minutes  SIGNED:   Seena Dadds, MD  [facility] 02/29/2024, 1:08 PM Pager   If 7PM-7AM, please contact night-coverage [email or web address] Password [email or web address]

## 2024-03-18 NOTE — Progress Notes (Unsigned)
 Cardiology Clinic Note   Patient Name: Brendan [name] Date of Encounter: 03/23/2024  Primary Care Provider:  Default, Provider, MD Primary Cardiologist:  None  Patient Profile    Brendan [name] 63 year old male presents to clinic today for follow-up evaluation post CVA.  Past Medical History    Past Medical History:  Diagnosis Date   Coronary artery disease    Diabetes mellitus without complication (HCC)    Hyperlipidemia    Hypertension    Kidney disease    Sarcoidosis    Stroke [facility])    Past Surgical History:  Procedure Laterality Date   CARDIAC CATHETERIZATION     JOINT REPLACEMENT Bilateral    TKR   RIGHT/LEFT HEART CATH AND CORONARY ANGIOGRAPHY N/A 08/01/2022   Procedure: RIGHT/LEFT HEART CATH AND CORONARY ANGIOGRAPHY;  Surgeon: Swaziland, Peter M, MD;  Location: [facility] INVASIVE CV LAB;  Service: Cardiovascular;  Laterality: N/A;   ROTATOR CUFF REPAIR Left     Allergies  Allergies  Allergen Reactions   Latex Swelling   Lisinopril Swelling   Losartan Swelling   Metformin Nausea Only   Sildenafil Palpitations    Other reaction(s): Headache, Palpitation, Headache, Palpitation, Headache, Palpitations    History of Present Illness    Brendan [name] has a PMH of coronary artery disease, diabetes, HTN, HLD, sarcoidosis, PFO, and acute CVA.  Neurology noted PFO in 2024.  He is following with neurology at [facility] (Dr. Cipriano Creeks).  He presented to the emergency department on 02/28/2024 and was discharged on 02/29/2024.  He reported right hand weakness.  His workup in the ED was significant for acute CVA.  He was admitted at that time.  He was noted to have an embolus in the left precentral gyrus causing infarct.  It was felt that his stroke was cryptogenic.  CT head and neck showed moderate stenosis of his distal anterior and inferior left M2 segment.  Venous Dopplers showed no evidence of DVT.  He underwent echocardiogram with bubble study which showed PFO.   He was on aspirin  prior to admission and Plavix  was added to his medication regimen with plan to continue dual antiplatelet therapy for 3 weeks and then go to Plavix  alone.  30-day cardiac event monitor was recommended.  Outpatient TEE was also recommended to evaluate for need of PFO closure.  He presents to the clinic today for follow-up evaluation and states he feels well today.  He did have a couple episodes of lightheadedness.  He attributes this to his diet.  He had not eaten breakfast on one of the days.  He has not had any further episodes.  He continues to have left mouth numbness and left hand numbness from his previous CVA.  We reviewed his hospitalization.  He expressed understanding.  He agrees to proceed with TEE and 30-day cardiac event monitor.  I have explained that we will review findings once tests have been done and make further recommendations.  He and his wife expressed understanding..  Today he denies chest pain, shortness of breath, lower extremity edema, fatigue, palpitations, melena, hematuria, hemoptysis, diaphoresis, weakness, presyncope, syncope, orthopnea, and PND.   Home Medications    Prior to Admission medications   Medication Sig Start Date End Date Taking? Authorizing Provider  albuterol (VENTOLIN HFA) 108 (90 Base) MCG/ACT inhaler Inhale 1-2 puffs into the lungs every 6 (six) hours as needed for shortness of breath or wheezing. 04/20/21   [provider]  amLODipine (NORVASC) 10 MG tablet Take 1  tablet by mouth daily. 10/15/22   [provider]  amoxicillin  (AMOXIL ) 500 MG capsule TAKE FOUR CAPSULES BY MOUTH ONCE 1 HOUR PRIOR TO THE DENTAL TREATMENT 07/15/22   Swaziland, Peter M, MD  aspirin  EC 81 MG tablet Take 1 tablet (81 mg total) by mouth at bedtime for 19 days. Swallow whole. 02/29/24 03/19/24  Elgergawy, Ardia Kraft, MD  atorvastatin  (LIPITOR ) 80 MG tablet Take 80 mg by mouth daily. 09/17/22 02/27/24  [provider]  clobetasol cream (TEMOVATE)  0.05 %  07/29/22   [provider]  clopidogrel  (PLAVIX ) 75 MG tablet Take 1 tablet (75 mg total) by mouth daily. 03/31/24   Elgergawy, Ardia Kraft, MD  diazepam  (VALIUM ) 5 MG tablet Take 5 mg by mouth daily as needed for anxiety. 12/14/20   [provider]  diclofenac Sodium (VOLTAREN) 1 % GEL Apply 2 g topically as needed (pain). 06/27/23   [provider]  DULoxetine  (CYMBALTA ) 30 MG capsule Take 30-60 mg by mouth 2 (two) times daily. Take 60 mg in the morning and 30 mg at night 08/18/23   [provider]  empagliflozin  (JARDIANCE ) 25 MG TABS tablet Take 0.5 tablets by mouth daily. 11/29/22   [provider]  EPINEPHrine  0.3 mg/0.3 mL IJ SOAJ injection Inject 0.3 mg into the muscle as needed for anaphylaxis. 01/31/22   Coretha Dew, PA-C  escitalopram  (LEXAPRO ) 10 MG tablet Take 10 mg by mouth at bedtime. 10/05/21   [provider]  ezetimibe  (ZETIA ) 10 MG tablet Take 10 mg by mouth daily. 04/21/23 02/27/24  [provider]  hydrOXYzine (ATARAX) 25 MG tablet Take 25 mg by mouth 3 (three) times daily.    [provider]  mometasone -formoterol  (DULERA) 100-5 MCG/ACT AERO Inhale 2 puffs into the lungs in the morning and at bedtime. 12/27/22   Parrett, Macdonald Savoy, NP  pantoprazole  (PROTONIX ) 40 MG tablet Take 1 tablet (40 mg total) by mouth daily. 02/29/24 03/30/24  Elgergawy, Ardia Kraft, MD  Semaglutide ,0.25 or 0.5MG /DOS, 2 MG/3ML SOPN Inject 0.5 mg into the skin once a week. sunday 01/03/23   [provider]  traZODone (DESYREL) 100 MG tablet Take 100 mg by mouth at bedtime. 12/27/21   [provider]  Vitamin D , Ergocalciferol , (DRISDOL ) 1.25 MG (50000 UNIT) CAPS capsule Take 50,000 Units by mouth every 7 (seven) days. monday 07/03/23   [provider]  zolpidem (AMBIEN) 10 MG tablet Take 10 mg by mouth at bedtime. 10/05/21 07/30/24  [provider]    Family History    Family History  Problem Relation Age of Onset    Heart failure Mother 98   He indicated that his mother is deceased. He indicated that his father is deceased.  Social History    Social History   Socioeconomic History   Marital status: Married    Spouse name: Not on file   Number of children: 3   Years of education: 16   Highest education level: Bachelor's degree (e.g., BA, AB, BS)  Occupational History   Occupation: Retired  Tobacco Use   Smoking status: Never   Smokeless tobacco: Never  Substance and Sexual Activity   Alcohol use: Not Currently   Drug use: Not Currently   Sexual activity: Not on file  Other Topics Concern   Not on file  Social History Narrative   Retired Art gallery manager for [employer]   Social Drivers of Health   Financial Resource Strain: Low Risk  (02/11/2023)   Received from [facility],  [facility]   Overall Financial Resource Strain (CARDIA)    Difficulty of Paying Living Expenses: Not hard at all  Food Insecurity: No Food Insecurity (02/28/2024)   Hunger Vital Sign    Worried About Running Out of Food in the Last Year: Never true    Ran Out of Food in the Last Year: Never true  Transportation Needs: Unknown (02/28/2024)   PRAPARE - Administrator, Civil Service (Medical): Not on file    Lack of Transportation (Non-Medical): No  Physical Activity: Not on file  Stress: Not on file  Social Connections: Unknown (02/27/2022)   Received from [facility], [facility]   Social Network    Social Network: Not on file  Intimate Partner Violence: Not At Risk (02/28/2024)   Humiliation, Afraid, Rape, and Kick questionnaire    Fear of Current or Ex-Partner: No    Emotionally Abused: No    Physically Abused: No    Sexually Abused: No     Review of Systems    General:  No chills, fever, night sweats or weight changes.  Cardiovascular:  No chest pain, dyspnea on exertion, edema, orthopnea, palpitations, paroxysmal nocturnal dyspnea. Dermatological: No rash, lesions/masses Respiratory: No cough,  dyspnea Urologic: No hematuria, dysuria Abdominal:   No nausea, vomiting, diarrhea, bright red blood per rectum, melena, or hematemesis Neurologic:  No visual changes, wkns, changes in mental status. All other systems reviewed and are otherwise negative except as noted above.  Physical Exam    VS:  BP 128/74   Pulse 81   Ht 5\' 10"  (1.778 m)   Wt 202 lb 12.8 oz (92 kg)   SpO2 98%   BMI 29.10 kg/m  , BMI Body mass index is 29.1 kg/m. GEN: Well nourished, well developed, in no acute distress. HEENT: normal. Neck: Supple, no JVD, carotid bruits, or masses. Cardiac: RRR, no murmurs, rubs, or gallops. No clubbing, cyanosis, edema.  Radials/DP/PT 2+ and equal bilaterally.  Respiratory:  Respirations regular and unlabored, clear to auscultation bilaterally. GI: Soft, nontender, nondistended, BS + x 4. MS: no deformity or atrophy. Skin: warm and dry, no rash. Neuro:  Strength and sensation are intact. Psych: Normal affect.  Accessory Clinical Findings    Recent Labs: 02/27/2024: ALT 29; Platelets 175 02/28/2024: BUN 10; Creatinine, Ser 1.20; Hemoglobin 13.9; Potassium 3.8; Sodium 137   Recent Lipid Panel    Component Value Date/Time   CHOL 97 02/27/2024 2315   TRIG 65 02/27/2024 2315   HDL 35 (L) 02/27/2024 2315   CHOLHDL 2.8 02/27/2024 2315   VLDL 13 02/27/2024 2315   LDLCALC 49 02/27/2024 2315         ECG personally reviewed by me today- EKG Interpretation Date/Time:  Tuesday Mar 23 2024 10:24:32 EDT Ventricular Rate:  81 PR Interval:  156 QRS Duration:  108 QT Interval:  372 QTC Calculation: 432 R Axis:   -39  Text Interpretation: Normal sinus rhythm Left axis deviation When compared with ECG of 20-Aug-2023 09:31, No significant change was found Confirmed by Lawana Pray [phone]) on 03/23/2024 10:35:27 AM    Echocardiogram 08/14/2022  IMPRESSIONS     1. Left ventricular ejection fraction, by estimation, is 60 to 65%. The  left ventricle has normal function. The  left ventricle has no regional  wall motion abnormalities. Left ventricular diastolic parameters were  normal.   2. Right ventricular systolic function is normal. The right ventricular  size is normal.   3. Interatrial septum is hypermobile No  PFO seen by color doppler.   4. The mitral valve is normal in structure. Mild mitral valve  regurgitation.   5. The aortic valve is normal in structure. Aortic valve regurgitation is  not visualized.   6. The inferior vena cava is normal in size with greater than 50%  respiratory variability, suggesting right atrial pressure of 3 mmHg.   FINDINGS   Left Ventricle: Left ventricular ejection fraction, by estimation, is 60  to 65%. The left ventricle has normal function. The left ventricle has no  regional wall motion abnormalities. The left ventricular internal cavity  size was normal in size. There is   no left ventricular hypertrophy. Left ventricular diastolic parameters  were normal.   Right Ventricle: The right ventricular size is normal. Right vetricular  wall thickness was not assessed. Right ventricular systolic function is  normal.   Left Atrium: Left atrial size was normal in size.   Right Atrium: Right atrial size was normal in size.   Pericardium: There is no evidence of pericardial effusion.   Mitral Valve: The mitral valve is normal in structure. Mild mitral valve  regurgitation.   Tricuspid Valve: The tricuspid valve is normal in structure. Tricuspid  valve regurgitation is trivial.   Aortic Valve: The aortic valve is normal in structure. Aortic valve  regurgitation is not visualized.   Pulmonic Valve: The pulmonic valve was normal in structure. Pulmonic valve  regurgitation is trivial.   Aorta: The aortic root and ascending aorta are structurally normal, with  no evidence of dilitation.   Venous: The inferior vena cava is normal in size with greater than 50%  respiratory variability, suggesting right atrial pressure  of 3 mmHg.   IAS/Shunts: No atrial level shunt detected by color flow Doppler.   LHC 08/01/2022    Mid LAD lesion is 70% stenosed.   1st Diag lesion is 90% stenosed.   2nd Diag lesion is 90% stenosed.   Prox Cx to Mid Cx lesion is 75% stenosed.   Prox RCA lesion is 40% stenosed.   LV end diastolic pressure is normal.   2 vessel obstructive CAD. There is severe disease in 2 small diagonal vessels. Moderate disease in the mid LAD and mid LCx.  Normal LV filling pressures. LVEDP 12 mm Hg. PCWP 12/10 with mean 10 mm Hg. Normal right heart pressures. PAP 22/10 with mean 16 mm Hg Normal cardiac output 7.1 L/min with index 3.33   Plan: will obtain an Echo to better assess LV function. I don't think his degree of CAD can explain that low EF. If low EF is confirmed on Echo will optimize CHF therapy. The patient has no active angina and perfusion on Myoview  is Ok so I would not pursue revascularization at this point.  Diagnostic Dominance: Right  Intervention   Assessment & Plan   1.  Acute CVA-admitted to the emergency department on 02/28/2024 with right hand weakness.  Previous bubble study showed PFO.  Following with neurology.  Plan was made to continue dual antiplatelet therapy for 3 weeks and then transition to Plavix  monotherapy. Order 30-day cardiac event monitor Ordered TEE Continue Plavix  Stop aspirin   History of CVA/TIA-neurologic event happened post knee surgery.  He was placed on aspirin  at that time. Continue Plavix , atorvastatin  Heart healthy low-sodium diet  Essential hypertension-BP today 128/74. Maintain blood pressure log Continue amlodipine  Hyperlipidemia-LDL 49 on 02/27/24. High-fiber diet Increase physical activity as tolerated Continue aspirin , atorvastatin   Type 2 diabetes-A1c 7. Carb modified diet Continue  Jardiance  and semaglutide  Follows with PCP  Disposition: Follow-up with Dr. Swaziland or me in 2 months.   Chet Cota. Oneita Allmon NP-C     03/23/2024,  10:54 AM [facility] [address] Office [phone] Fax [phone]    I spent 15 minutes examining this patient, reviewing medications, and using patient centered shared decision making involving their cardiac care.   I spent  20 minutes reviewing past medical history,  medications, and prior cardiac tests.

## 2024-03-23 ENCOUNTER — Encounter: Payer: Self-pay | Admitting: General Practice

## 2024-03-23 ENCOUNTER — Ambulatory Visit: Attending: General Practice | Admitting: General Practice

## 2024-03-23 VITALS — BP 128/74 | HR 81 | Ht 70.0 in | Wt 202.8 lb

## 2024-03-23 DIAGNOSIS — R0609 Other forms of dyspnea: Secondary | ICD-10-CM

## 2024-03-23 DIAGNOSIS — I2510 Atherosclerotic heart disease of native coronary artery without angina pectoris: Secondary | ICD-10-CM

## 2024-03-23 DIAGNOSIS — I25118 Atherosclerotic heart disease of native coronary artery with other forms of angina pectoris: Secondary | ICD-10-CM | POA: Diagnosis not present

## 2024-03-23 DIAGNOSIS — I429 Cardiomyopathy, unspecified: Secondary | ICD-10-CM

## 2024-03-23 DIAGNOSIS — E785 Hyperlipidemia, unspecified: Secondary | ICD-10-CM | POA: Diagnosis not present

## 2024-03-23 DIAGNOSIS — I639 Cerebral infarction, unspecified: Secondary | ICD-10-CM

## 2024-03-23 NOTE — Patient Instructions (Signed)
 Medication Instructions:  STOP ASPIRIN  START PLAVIX  75MG  DAILY *If you need a refill on your cardiac medications before your next appointment, please call your pharmacy*  Lab Work: NONE If you have labs (blood work) drawn today and your tests are completely normal, you will receive your results only by: MyChart Message (if you have MyChart) OR A paper copy in the mail If you have any lab test that is abnormal or we need to change your treatment, we will call you to review the results.  Testing/Procedures: Your physician has recommended that you wear an event monitor. Event monitors are medical devices that record the heart's electrical activity. Doctors most often us  these monitors to diagnose arrhythmias. Arrhythmias are problems with the speed or rhythm of the heartbeat. The monitor is a small, portable device. You can wear one while you do your normal daily activities. This is usually used to diagnose what is causing palpitations/syncope (passing out).   Your physician has requested that you have a TEE. During a TEE, sound waves are used to create images of your heart. It provides your doctor with information about the size and shape of your heart and how well your heart's chambers and valves are working. In this test, a transducer is attached to the end of a flexible tube that's guided down your throat and into your esophagus (the tube leading from you mouth to your stomach) to get a more detailed image of your heart. You are not awake for the procedure. Please see the instruction sheet given to you today. For further information please visit [email or web address]    Follow-Up: At [facility], you and your health needs are our priority.  As part of our continuing mission to provide you with exceptional heart care, our providers are all part of one team.  This team includes your primary Cardiologist (physician) and Advanced Practice Providers or APPs (Physician Assistants and Nurse  Practitioners) who all work together to provide you with the care you need, when you need it.  Your next appointment:   2 month(s)  Provider:   Peter Swaziland, MD or Lawana Pray, NP

## 2024-03-29 ENCOUNTER — Other Ambulatory Visit (HOSPITAL_COMMUNITY): Payer: Self-pay

## 2024-03-29 ENCOUNTER — Other Ambulatory Visit: Payer: Self-pay | Admitting: Cardiology

## 2024-03-29 MED ORDER — PANTOPRAZOLE SODIUM 40 MG PO TBEC
40.0000 mg | DELAYED_RELEASE_TABLET | Freq: Every day | ORAL | 0 refills | Status: DC
  Filled 2024-03-29: qty 30, 30d supply, fill #0

## 2024-03-29 MED ORDER — CLOPIDOGREL BISULFATE 75 MG PO TABS: 75.0000 mg | ORAL_TABLET | Freq: Every day | ORAL | 0 refills | Status: DC

## 2024-03-29 NOTE — Telephone Encounter (Signed)
*  STAT* If patient is at the pharmacy, call can be transferred to refill team.   1. Which medications need to be refilled? (please list name of each medication and dose if known) clopidogrel  (PLAVIX ) 75 MG tablet    pantoprazole  (PROTONIX ) 40 MG tablet    2. Would you like to learn more about the convenience, safety, & potential cost savings by using the [facility]? No      3. Are you open to using the [facility] (Type [facility]. ). No    4. Which pharmacy/location (including street and city if local pharmacy) is medication to be sent to? [facility] pharmacy Address: [address] [place], [place], [place] [address] Phone: [phone]   5. Do they need a 30 day or 90 day supply? 90 days   Pt only have 1 pill left

## 2024-03-29 NOTE — Telephone Encounter (Signed)
 Pt is requesting a refill on medications clopidogrel  and pantoprazole . These medications were prescribed in the hospital. Would Dr. Swaziland like to refill these medications? Please address

## 2024-03-31 ENCOUNTER — Other Ambulatory Visit (HOSPITAL_COMMUNITY): Payer: Self-pay

## 2024-03-31 ENCOUNTER — Telehealth: Payer: Self-pay | Admitting: Cardiology

## 2024-03-31 MED ORDER — CLOPIDOGREL BISULFATE 75 MG PO TABS: 75.0000 mg | ORAL_TABLET | Freq: Every day | ORAL | 3 refills | Status: AC

## 2024-03-31 MED ORDER — PANTOPRAZOLE SODIUM 40 MG PO TBEC: 40.0000 mg | DELAYED_RELEASE_TABLET | Freq: Every day | ORAL | 3 refills | Status: AC

## 2024-03-31 NOTE — Telephone Encounter (Signed)
 RX sent to requested Pharmacy

## 2024-03-31 NOTE — Telephone Encounter (Signed)
*  STAT* If patient is at the pharmacy, call can be transferred to refill team.   1. Which medications need to be refilled? (please list name of each medication and dose if known) clopidogrel  (PLAVIX ) 75 MG tablet    pantoprazole  (PROTONIX ) 40 MG tablet    2. Which pharmacy/location (including street and city if local pharmacy) is medication to be sent to? [facility] - [place], [place] - [address]   3. Do they need a 30 day or 90 day supply? 90 .

## 2024-05-07 ENCOUNTER — Ambulatory Visit: Attending: General Practice

## 2024-05-07 ENCOUNTER — Ambulatory Visit: Payer: Self-pay | Admitting: General Practice

## 2024-05-07 DIAGNOSIS — I639 Cerebral infarction, unspecified: Secondary | ICD-10-CM

## 2024-05-19 NOTE — Telephone Encounter (Signed)
 Patient is asking for a z-pack to be sent in for a [facility] in [place] on [address].   [phone]

## 2024-05-19 NOTE — Telephone Encounter (Signed)
 9/23? Just making sure that was correct

## 2024-05-25 NOTE — Progress Notes (Signed)
 Cardiology Office Note:    Date:  06/02/2024   ID:  Brendan [name], DOB 1961-02-24, MRN [id number]  PCP:  Default, Provider, MD   [facility] Providers Cardiologist:  Baylon Santelli [place], MD     Referring MD: No ref. provider found   Chief Complaint  Patient presents with   Stroke Symptoms    History of Present Illness:    Brendan [name] is a 63 y.o. male seen for follow up CAD. He had coronary and aortic calcification noted on CT. He has a history of Sarcoidosis, DM, HLD, and OSA on CPAP. He did have an Echo in March 2022 at [facility] showing EF 50-55%. Mild LVH. Mild diastolic dysfunction. There was an interatrial septal aneurysm with shunt (PFO) but agitated saline. Mild MR. Cranial MRI at that time showed 2 old lacunar infarcts and a small old cortical infarct in the posterior inferior right occipital lobe. CTA of head and neck showed no significant stenosis. He was placed on ASA by Neurology then. He does note his activity has been limited since his bilateral TKR. He does have some SOB on exertion that he attributes to his sarcoidosis. His primary care (Dr Hollace) in [place] follows his lab work.  He underwent Myoview  study in September 2023. This  was high risk due to low EF 33%. No ischemia. Cardiac cath was then performed showing moderate 2 vessel CAD. Severe disease in small diagonal vessels. Normal right heart and LV filling pressures. Echo performed showed normal EF. 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 Given lack of symptoms and normal perfusion in Myoview  he was treated medically.   He presented to the emergency department on 02/28/2024 and was discharged on 02/29/2024. He reported right hand weakness. His workup in the ED was significant for acute CVA. He was admitted at that time. He was noted to have an embolus in the left precentral gyrus causing infarct. It was felt that his stroke was cryptogenic. CT head and neck showed moderate stenosis of his distal anterior and  inferior left M2 segment. Venous Dopplers showed no evidence of DVT. He underwent echocardiogram with bubble study which showed PFO. He was on aspirin  prior to admission and Plavix  was added to his medication regimen with plan to continue dual antiplatelet therapy for 3 weeks and then go to Plavix  alone. 30-day cardiac event monitor was recommended. Outpatient TEE was also recommended to evaluate for need of PFO closure. Event monitor was negative for Afib.   On follow up today he is doing well from a cardiac standpoint. No chest pain. Is followed by pulmonary for sarcoidosis and reactive airway disease. Reports sugars are OK. LDL was 49 in May. No recurrent stroke symptoms.   Past Medical History:  Diagnosis Date   Coronary artery disease    Diabetes mellitus without complication (HCC)    Hyperlipidemia    Hypertension    Kidney disease    Sarcoidosis    Stroke [facility])     Past Surgical History:  Procedure Laterality Date   CARDIAC CATHETERIZATION     JOINT REPLACEMENT Bilateral    TKR   RIGHT/LEFT HEART CATH AND CORONARY ANGIOGRAPHY N/A 08/01/2022   Procedure: RIGHT/LEFT HEART CATH AND CORONARY ANGIOGRAPHY;  Surgeon: [place], Brittny Spangle M, MD;  Location: [facility] INVASIVE CV LAB;  Service: Cardiovascular;  Laterality: N/A;   ROTATOR CUFF REPAIR Left     Current Medications: Current Meds  Medication Sig   albuterol (VENTOLIN HFA) 108 (90 Base) MCG/ACT inhaler Inhale 1-2 puffs  into the lungs every 6 (six) hours as needed for shortness of breath or wheezing.   amLODipine (NORVASC) 10 MG tablet Take 1 tablet by mouth daily.   amoxicillin  (AMOXIL ) 500 MG capsule TAKE FOUR CAPSULES BY MOUTH ONCE 1 HOUR PRIOR TO THE DENTAL TREATMENT   atorvastatin  (LIPITOR ) 80 MG tablet Take 80 mg by mouth daily.   clobetasol cream (TEMOVATE) 0.05 %    clopidogrel  (PLAVIX ) 75 MG tablet Take 1 tablet (75 mg total) by mouth daily.   diazepam  (VALIUM ) 5 MG tablet Take 5 mg by mouth daily as needed for anxiety.    diclofenac Sodium (VOLTAREN) 1 % GEL Apply 2 g topically as needed (pain).   DULoxetine  (CYMBALTA ) 30 MG capsule Take 30 mg by mouth daily.   empagliflozin  (JARDIANCE ) 25 MG TABS tablet Take 0.5 tablets by mouth daily.   EPINEPHrine  0.3 mg/0.3 mL IJ SOAJ injection Inject 0.3 mg into the muscle as needed for anaphylaxis.   escitalopram  (LEXAPRO ) 10 MG tablet Take 10 mg by mouth at bedtime.   ezetimibe  (ZETIA ) 10 MG tablet Take 10 mg by mouth daily.   mometasone -formoterol  (DULERA) 100-5 MCG/ACT AERO Inhale 2 puffs into the lungs in the morning and at bedtime.   pantoprazole  (PROTONIX ) 40 MG tablet Take 1 tablet (40 mg total) by mouth daily.   Semaglutide ,0.25 or 0.5MG /DOS, 2 MG/3ML SOPN Inject 0.5 mg into the skin once a week. sunday   traZODone (DESYREL) 100 MG tablet Take 100 mg by mouth at bedtime.   Vitamin D , Ergocalciferol , (DRISDOL ) 1.25 MG (50000 UNIT) CAPS capsule Take 50,000 Units by mouth every 7 (seven) days. monday   zolpidem (AMBIEN) 10 MG tablet Take 10 mg by mouth at bedtime.     Allergies:   Latex, Lisinopril, Losartan, Metformin, and Sildenafil   Social History   Socioeconomic History   Marital status: Married    Spouse name: Not on file   Number of children: 3   Years of education: 16   Highest education level: Bachelor's degree (e.g., BA, AB, BS)  Occupational History   Occupation: Retired  Tobacco Use   Smoking status: Never   Smokeless tobacco: Never  Substance and Sexual Activity   Alcohol use: Not Currently   Drug use: Not Currently   Sexual activity: Not on file  Other Topics Concern   Not on file  Social History Narrative   Retired Art gallery manager for [employer]   Social Drivers of Health   Financial Resource Strain: Low Risk  (03/24/2024)   Received from [facility]   Overall Financial Resource Strain (CARDIA)    Difficulty of Paying Living Expenses: Not hard at all  Food Insecurity: No Food Insecurity (03/24/2024)   Received from [facility]   Hunger  Vital Sign    Within the past 12 months, you worried that your food would run out before you got the money to buy more.: Never true    Within the past 12 months, the food you bought just didn't last and you didn't have money to get more.: Never true  Transportation Needs: No Transportation Needs (03/24/2024)   Received from [facility] - Transportation    Lack of Transportation (Medical): No    Lack of Transportation (Non-Medical): No  Physical Activity: Not on file  Stress: Not on file  Social Connections: Unknown (02/27/2022)   Received from [facility]   Social Network    Social Network: Not on file     Family History: The patient's  family history includes Heart failure (age of onset: 60) in his mother.  ROS:   Please see the history of present illness.     All other systems reviewed and are negative.  EKGs/Labs/Other Studies Reviewed:    The following studies were reviewed today: CLINICAL DATA:  Sarcoidosis. Restrictive lung disease. Evaluate for interstitial lung disease.   EXAM: CT CHEST WITHOUT CONTRAST   TECHNIQUE: Multidetector CT imaging of the chest was performed following the standard protocol without intravenous contrast. High resolution imaging of the lungs, as well as inspiratory and expiratory imaging, was performed.   RADIATION DOSE REDUCTION: This exam was performed according to the departmental dose-optimization program which includes automated exposure control, adjustment of the mA and/or kV according to patient size and/or use of iterative reconstruction technique.   COMPARISON:  04/03/2022 chest radiograph.   FINDINGS: Cardiovascular: Normal heart size. No significant pericardial effusion/thickening. Three-vessel coronary atherosclerosis. Mildly atherosclerotic nonaneurysmal thoracic aorta. Normal caliber pulmonary arteries.   Mediastinum/Nodes: No discrete thyroid  nodules. Unremarkable esophagus. No axillary adenopathy. Shotty  nonenlarged paratracheal and prevascular mediastinal nodes. No pathologically enlarged mediastinal or discrete hilar nodes on these noncontrast images.   Lungs/Pleura: No pneumothorax. No pleural effusion. Several scattered small solid peribronchovascular the perifissural and subpleural pulmonary nodules in both lungs, for example a 0.5 cm right upper lobe peribronchovascular nodule (series 6/image 65) and a 0.5 cm left major fissure nodule (series 6/image 69). No acute consolidative airspace disease or lung masses. No significant regions of subpleural reticulation, ground-glass opacity, traction bronchiectasis, architectural distortion or frank honeycombing. No significant lobular air trapping or evidence of tracheobronchomalacia on the expiration sequence.   Upper abdomen: Subcentimeter posterior right liver dome hypodense lesion is too small to characterize.   Musculoskeletal: No aggressive appearing focal osseous lesions. Mild thoracic spondylosis.   IMPRESSION: 1. Scattered perilymphatic distribution small solid pulmonary nodules, largest 0.5 cm, compatible with pulmonary sarcoidosis. 2. No evidence of interstitial lung disease. 3. Three-vessel coronary atherosclerosis. 4. Aortic Atherosclerosis (ICD10-I70.0).     Electronically Signed   By: Selinda DELENA Blue M.D.   On: 06/17/2022 15:38   Myoview  07/25/22: Study Highlights      Findings are consistent with no ischemia. The study is high risk.   No ST deviation was noted.   LV perfusion is normal. There is no evidence of ischemia. There is no evidence of infarction.   Left ventricular function is abnormal. Global function is moderately reduced. Nuclear stress EF: 33 %. The left ventricular ejection fraction is moderately decreased (30-44%). End diastolic cavity size is mildly enlarged. End systolic cavity size is moderately enlarged.   Prior study not available for comparison.   Normal perfusion study, but EF measures at 33%  and visually appears hypokinetic. Recommend echo to more accurately evaluate wall motion and function.    Cardiac cath 08/01/22:  RIGHT/LEFT HEART CATH AND CORONARY ANGIOGRAPHY   Conclusion      Mid LAD lesion is 70% stenosed.   1st Diag lesion is 90% stenosed.   2nd Diag lesion is 90% stenosed.   Prox Cx to Mid Cx lesion is 75% stenosed.   Prox RCA lesion is 40% stenosed.   LV end diastolic pressure is normal.   2 vessel obstructive CAD. There is severe disease in 2 small diagonal vessels. Moderate disease in the mid LAD and mid LCx.  Normal LV filling pressures. LVEDP 12 mm Hg. PCWP 12/10 with mean 10 mm Hg. Normal right heart pressures. PAP 22/10 with mean 16 mm Hg Normal  cardiac output 7.1 L/min with index 3.33   Plan: will obtain an Echo to better assess LV function. I don't think his degree of CAD can explain that low EF. If low EF is confirmed on Echo will optimize CHF therapy. The patient has no active angina and perfusion on Myoview  is Ok so I would not pursue revascularization at this point.   Coronary Diagrams  Diagnostic Dominance: Right  Intervention  Echo 08/14/22: IMPRESSIONS     1. Left ventricular ejection fraction, by estimation, is 60 to 65%. The  left ventricle has normal function. The left ventricle has no regional  wall motion abnormalities. Left ventricular diastolic parameters were  normal.   2. Right ventricular systolic function is normal. The right ventricular  size is normal.   3. Interatrial septum is hypermobile No PFO seen by color doppler.   4. The mitral valve is normal in structure. Mild mitral valve  regurgitation.   5. The aortic valve is normal in structure. Aortic valve regurgitation is  not visualized.   6. The inferior vena cava is normal in size with greater than 50%  respiratory variability, suggesting right atrial pressure of 3 mmHg.        Recent Labs: 02/27/2024: ALT 29; Platelets 175 02/28/2024: BUN 10; Creatinine, Ser 1.20;  Hemoglobin 13.9; Potassium 3.8; Sodium 137  Recent Lipid Panel    Component Value Date/Time   CHOL 97 02/27/2024 2315   TRIG 65 02/27/2024 2315   HDL 35 (L) 02/27/2024 2315   CHOLHDL 2.8 02/27/2024 2315   VLDL 13 02/27/2024 2315   LDLCALC 49 02/27/2024 2315     Risk Assessment/Calculations:                Physical Exam:    VS:  BP 112/68   Pulse 75   Ht 5' 10 (1.778 m)   Wt 207 lb 3.2 oz (94 kg)   SpO2 99%   BMI 29.73 kg/m     Wt Readings from Last 3 Encounters:  06/02/24 207 lb 3.2 oz (94 kg)  03/23/24 202 lb 12.8 oz (92 kg)  08/20/23 210 lb 6.4 oz (95.4 kg)     GEN:  Well nourished, well developed in no acute distress HEENT: Normal NECK: No JVD; No carotid bruits LYMPHATICS: No lymphadenopathy CARDIAC: RRR, no murmurs, rubs, gallops RESPIRATORY:  Clear to auscultation without rales, wheezing or rhonchi  ABDOMEN: Soft, non-tender, non-distended MUSCULOSKELETAL:  No edema; No deformity  SKIN: Warm and dry NEUROLOGIC:  Alert and oriented x 3 PSYCHIATRIC:  Normal affect   ASSESSMENT:    1. Pre-procedure lab exam   2. Cryptogenic stroke (HCC)   3. Coronary artery disease of native artery of native heart with stable angina pectoris (HCC)   4. Type 2 diabetes mellitus without complication, without long-term current use of insulin  (HCC)   5. Primary hypertension       PLAN:    In order of problems listed above:  CAD. Multiple coronary risk factors.   Myoview  was high risk due to low EF but showed normal perfusion. Cath showed severe disease in small diagonal branches. Moderate LAD and LCx disease. Given lack of ischemia on stress test and no angina medical therapy recommended. Stressed importance of risk factor modification. He is asymptomatic. Low EF noted on Myoview  study but by Echo EF is normal. No additional therapy needed.  DM on oral therapy. Intolerance of metformin. Now on Jardiance  and Ozempic . Reports A1c at [facility] 6.8% HLD. On statin. LDL  at goal  49.  HTN. Currently on amlodipine with good control  OSA on CPAP Sarcoidosis. Per pulmonary. No overt evidence of cardiac sarcoid. On Dulera for reactive airway disease.  History of recurrent CVA. Likely has PFO with positive transcranial doppler bubble study and prior Echo showing hypermobile IAS. Will arrange for TEE and refer to structural team for PFO closure.       Medication Adjustments/Labs and Tests Ordered: Current medicines are reviewed at length with the patient today.  Concerns regarding medicines are outlined above.  Orders Placed This Encounter  Procedures   CBC w/Diff   Basic metabolic panel with GFR   No orders of the defined types were placed in this encounter.   There are no Patient Instructions on file for this visit.    Plan follow up with me in 6 months  Signed, Bawi Lakins [place], MD  06/02/2024 8:40 AM    [facility]

## 2024-05-25 NOTE — H&P (View-Only) (Signed)
 Cardiology Office Note:    Date:  06/02/2024   ID:  Brendan [name], DOB 1961-02-24, MRN [id number]  PCP:  Default, Provider, MD   [facility] Providers Cardiologist:  Brendan Santelli [place], MD     Referring MD: No ref. provider found   Chief Complaint  Patient presents with   Stroke Symptoms    History of Present Illness:    Brendan [name] is a 63 y.o. male seen for follow up CAD. He had coronary and aortic calcification noted on CT. He has a history of Sarcoidosis, DM, HLD, and OSA on CPAP. He did have an Echo in March 2022 at [facility] showing EF 50-55%. Mild LVH. Mild diastolic dysfunction. There was an interatrial septal aneurysm with shunt (PFO) but agitated saline. Mild MR. Cranial MRI at that time showed 2 old lacunar infarcts and a small old cortical infarct in the posterior inferior right occipital lobe. CTA of head and neck showed no significant stenosis. He was placed on ASA by Neurology then. He does note his activity has been limited since his bilateral TKR. He does have some SOB on exertion that he attributes to his sarcoidosis. His primary care (Dr Brendan [name]) in [place] follows his lab work.  He underwent Myoview  study in September 2023. This  was high risk due to low EF 33%. No ischemia. Cardiac cath was then performed showing moderate 2 vessel CAD. Severe disease in small diagonal vessels. Normal right heart and LV filling pressures. Echo performed showed normal EF. 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 Given lack of symptoms and normal perfusion in Myoview  he was treated medically.   He presented to the emergency department on 02/28/2024 and was discharged on 02/29/2024. He reported right hand weakness. His workup in the ED was significant for acute CVA. He was admitted at that time. He was noted to have an embolus in the left precentral gyrus causing infarct. It was felt that his stroke was cryptogenic. CT head and neck showed moderate stenosis of his distal anterior and  inferior left M2 segment. Venous Dopplers showed no evidence of DVT. He underwent echocardiogram with bubble study which showed PFO. He was on aspirin  prior to admission and Plavix  was added to his medication regimen with plan to continue dual antiplatelet therapy for 3 weeks and then go to Plavix  alone. 30-day cardiac event monitor was recommended. Outpatient TEE was also recommended to evaluate for need of PFO closure. Event monitor was negative for Afib.   On follow up today he is doing well from a cardiac standpoint. No chest pain. Is followed by pulmonary for sarcoidosis and reactive airway disease. Reports sugars are OK. LDL was 49 in May. No recurrent stroke symptoms.   Past Medical History:  Diagnosis Date   Coronary artery disease    Diabetes mellitus without complication (HCC)    Hyperlipidemia    Hypertension    Kidney disease    Sarcoidosis    Stroke [facility])     Past Surgical History:  Procedure Laterality Date   CARDIAC CATHETERIZATION     JOINT REPLACEMENT Bilateral    TKR   RIGHT/LEFT HEART CATH AND CORONARY ANGIOGRAPHY N/A 08/01/2022   Procedure: RIGHT/LEFT HEART CATH AND CORONARY ANGIOGRAPHY;  Surgeon: [name], Brittny Spangle M, MD;  Location: [facility] INVASIVE CV LAB;  Service: Cardiovascular;  Laterality: N/A;   ROTATOR CUFF REPAIR Left     Current Medications: Current Meds  Medication Sig   albuterol (VENTOLIN HFA) 108 (90 Base) MCG/ACT inhaler Inhale 1-2 puffs  into the lungs every 6 (six) hours as needed for shortness of breath or wheezing.   amLODipine (NORVASC) 10 MG tablet Take 1 tablet by mouth daily.   amoxicillin  (AMOXIL ) 500 MG capsule TAKE FOUR CAPSULES BY MOUTH ONCE 1 HOUR PRIOR TO THE DENTAL TREATMENT   atorvastatin  (LIPITOR ) 80 MG tablet Take 80 mg by mouth daily.   clobetasol cream (TEMOVATE) 0.05 %    clopidogrel  (PLAVIX ) 75 MG tablet Take 1 tablet (75 mg total) by mouth daily.   diazepam  (VALIUM ) 5 MG tablet Take 5 mg by mouth daily as needed for anxiety.    diclofenac Sodium (VOLTAREN) 1 % GEL Apply 2 g topically as needed (pain).   DULoxetine  (CYMBALTA ) 30 MG capsule Take 30 mg by mouth daily.   empagliflozin  (JARDIANCE ) 25 MG TABS tablet Take 0.5 tablets by mouth daily.   EPINEPHrine  0.3 mg/0.3 mL IJ SOAJ injection Inject 0.3 mg into the muscle as needed for anaphylaxis.   escitalopram  (LEXAPRO ) 10 MG tablet Take 10 mg by mouth at bedtime.   ezetimibe  (ZETIA ) 10 MG tablet Take 10 mg by mouth daily.   mometasone -formoterol  (DULERA) 100-5 MCG/ACT AERO Inhale 2 puffs into the lungs in the morning and at bedtime.   pantoprazole  (PROTONIX ) 40 MG tablet Take 1 tablet (40 mg total) by mouth daily.   Semaglutide ,0.25 or 0.5MG /DOS, 2 MG/3ML SOPN Inject 0.5 mg into the skin once a week. sunday   traZODone (DESYREL) 100 MG tablet Take 100 mg by mouth at bedtime.   Vitamin D , Ergocalciferol , (DRISDOL ) 1.25 MG (50000 UNIT) CAPS capsule Take 50,000 Units by mouth every 7 (seven) days. monday   zolpidem (AMBIEN) 10 MG tablet Take 10 mg by mouth at bedtime.     Allergies:   Latex, Lisinopril, Losartan, Metformin, and Sildenafil   Social History   Socioeconomic History   Marital status: Married    Spouse name: Not on file   Number of children: 3   Years of education: 16   Highest education level: Bachelor's degree (e.g., BA, AB, BS)  Occupational History   Occupation: Retired  Tobacco Use   Smoking status: Never   Smokeless tobacco: Never  Substance and Sexual Activity   Alcohol use: Not Currently   Drug use: Not Currently   Sexual activity: Not on file  Other Topics Concern   Not on file  Social History Narrative   Retired Art gallery manager for [employer]   Social Drivers of Health   Financial Resource Strain: Low Risk  (03/24/2024)   Received from [facility]   Overall Financial Resource Strain (CARDIA)    Difficulty of Paying Living Expenses: Not hard at all  Food Insecurity: No Food Insecurity (03/24/2024)   Received from [facility]   Hunger  Vital Sign    Within the past 12 months, you worried that your food would run out before you got the money to buy more.: Never true    Within the past 12 months, the food you bought just didn't last and you didn't have money to get more.: Never true  Transportation Needs: No Transportation Needs (03/24/2024)   Received from [facility] - Transportation    Lack of Transportation (Medical): No    Lack of Transportation (Non-Medical): No  Physical Activity: Not on file  Stress: Not on file  Social Connections: Unknown (02/27/2022)   Received from [facility]   Social Network    Social Network: Not on file     Family History: The patient's  family history includes Heart failure (age of onset: 60) in his mother.  ROS:   Please see the history of present illness.     All other systems reviewed and are negative.  EKGs/Labs/Other Studies Reviewed:    The following studies were reviewed today: CLINICAL DATA:  Sarcoidosis. Restrictive lung disease. Evaluate for interstitial lung disease.   EXAM: CT CHEST WITHOUT CONTRAST   TECHNIQUE: Multidetector CT imaging of the chest was performed following the standard protocol without intravenous contrast. High resolution imaging of the lungs, as well as inspiratory and expiratory imaging, was performed.   RADIATION DOSE REDUCTION: This exam was performed according to the departmental dose-optimization program which includes automated exposure control, adjustment of the mA and/or kV according to patient size and/or use of iterative reconstruction technique.   COMPARISON:  04/03/2022 chest radiograph.   FINDINGS: Cardiovascular: Normal heart size. No significant pericardial effusion/thickening. Three-vessel coronary atherosclerosis. Mildly atherosclerotic nonaneurysmal thoracic aorta. Normal caliber pulmonary arteries.   Mediastinum/Nodes: No discrete thyroid  nodules. Unremarkable esophagus. No axillary adenopathy. Shotty  nonenlarged paratracheal and prevascular mediastinal nodes. No pathologically enlarged mediastinal or discrete hilar nodes on these noncontrast images.   Lungs/Pleura: No pneumothorax. No pleural effusion. Several scattered small solid peribronchovascular the perifissural and subpleural pulmonary nodules in both lungs, for example a 0.5 cm right upper lobe peribronchovascular nodule (series 6/image 65) and a 0.5 cm left major fissure nodule (series 6/image 69). No acute consolidative airspace disease or lung masses. No significant regions of subpleural reticulation, ground-glass opacity, traction bronchiectasis, architectural distortion or frank honeycombing. No significant lobular air trapping or evidence of tracheobronchomalacia on the expiration sequence.   Upper abdomen: Subcentimeter posterior right liver dome hypodense lesion is too small to characterize.   Musculoskeletal: No aggressive appearing focal osseous lesions. Mild thoracic spondylosis.   IMPRESSION: 1. Scattered perilymphatic distribution small solid pulmonary nodules, largest 0.5 cm, compatible with pulmonary sarcoidosis. 2. No evidence of interstitial lung disease. 3. Three-vessel coronary atherosclerosis. 4. Aortic Atherosclerosis (ICD10-I70.0).     Electronically Signed   By: Selinda DELENA Blue M.D.   On: 06/17/2022 15:38   Myoview  07/25/22: Study Highlights      Findings are consistent with no ischemia. The study is high risk.   No ST deviation was noted.   LV perfusion is normal. There is no evidence of ischemia. There is no evidence of infarction.   Left ventricular function is abnormal. Global function is moderately reduced. Nuclear stress EF: 33 %. The left ventricular ejection fraction is moderately decreased (30-44%). End diastolic cavity size is mildly enlarged. End systolic cavity size is moderately enlarged.   Prior study not available for comparison.   Normal perfusion study, but EF measures at 33%  and visually appears hypokinetic. Recommend echo to more accurately evaluate wall motion and function.    Cardiac cath 08/01/22:  RIGHT/LEFT HEART CATH AND CORONARY ANGIOGRAPHY   Conclusion      Mid LAD lesion is 70% stenosed.   1st Diag lesion is 90% stenosed.   2nd Diag lesion is 90% stenosed.   Prox Cx to Mid Cx lesion is 75% stenosed.   Prox RCA lesion is 40% stenosed.   LV end diastolic pressure is normal.   2 vessel obstructive CAD. There is severe disease in 2 small diagonal vessels. Moderate disease in the mid LAD and mid LCx.  Normal LV filling pressures. LVEDP 12 mm Hg. PCWP 12/10 with mean 10 mm Hg. Normal right heart pressures. PAP 22/10 with mean 16 mm Hg Normal  cardiac output 7.1 L/min with index 3.33   Plan: will obtain an Echo to better assess LV function. I don't think his degree of CAD can explain that low EF. If low EF is confirmed on Echo will optimize CHF therapy. The patient has no active angina and perfusion on Myoview  is Ok so I would not pursue revascularization at this point.   Coronary Diagrams  Diagnostic Dominance: Right  Intervention  Echo 08/14/22: IMPRESSIONS     1. Left ventricular ejection fraction, by estimation, is 60 to 65%. The  left ventricle has normal function. The left ventricle has no regional  wall motion abnormalities. Left ventricular diastolic parameters were  normal.   2. Right ventricular systolic function is normal. The right ventricular  size is normal.   3. Interatrial septum is hypermobile No PFO seen by color doppler.   4. The mitral valve is normal in structure. Mild mitral valve  regurgitation.   5. The aortic valve is normal in structure. Aortic valve regurgitation is  not visualized.   6. The inferior vena cava is normal in size with greater than 50%  respiratory variability, suggesting right atrial pressure of 3 mmHg.        Recent Labs: 02/27/2024: ALT 29; Platelets 175 02/28/2024: BUN 10; Creatinine, Ser 1.20;  Hemoglobin 13.9; Potassium 3.8; Sodium 137  Recent Lipid Panel    Component Value Date/Time   CHOL 97 02/27/2024 2315   TRIG 65 02/27/2024 2315   HDL 35 (L) 02/27/2024 2315   CHOLHDL 2.8 02/27/2024 2315   VLDL 13 02/27/2024 2315   LDLCALC 49 02/27/2024 2315     Risk Assessment/Calculations:                Physical Exam:    VS:  BP 112/68   Pulse 75   Ht 5' 10 (1.778 m)   Wt 207 lb 3.2 oz (94 kg)   SpO2 99%   BMI 29.73 kg/m     Wt Readings from Last 3 Encounters:  06/02/24 207 lb 3.2 oz (94 kg)  03/23/24 202 lb 12.8 oz (92 kg)  08/20/23 210 lb 6.4 oz (95.4 kg)     GEN:  Well nourished, well developed in no acute distress HEENT: Normal NECK: No JVD; No carotid bruits LYMPHATICS: No lymphadenopathy CARDIAC: RRR, no murmurs, rubs, gallops RESPIRATORY:  Clear to auscultation without rales, wheezing or rhonchi  ABDOMEN: Soft, non-tender, non-distended MUSCULOSKELETAL:  No edema; No deformity  SKIN: Warm and dry NEUROLOGIC:  Alert and oriented x 3 PSYCHIATRIC:  Normal affect   ASSESSMENT:    1. Pre-procedure lab exam   2. Cryptogenic stroke (HCC)   3. Coronary artery disease of native artery of native heart with stable angina pectoris (HCC)   4. Type 2 diabetes mellitus without complication, without long-term current use of insulin  (HCC)   5. Primary hypertension       PLAN:    In order of problems listed above:  CAD. Multiple coronary risk factors.   Myoview  was high risk due to low EF but showed normal perfusion. Cath showed severe disease in small diagonal branches. Moderate LAD and LCx disease. Given lack of ischemia on stress test and no angina medical therapy recommended. Stressed importance of risk factor modification. He is asymptomatic. Low EF noted on Myoview  study but by Echo EF is normal. No additional therapy needed.  DM on oral therapy. Intolerance of metformin. Now on Jardiance  and Ozempic . Reports A1c at [facility] 6.8% HLD. On statin. LDL  at goal  49.  HTN. Currently on amlodipine with good control  OSA on CPAP Sarcoidosis. Per pulmonary. No overt evidence of cardiac sarcoid. On Dulera for reactive airway disease.  History of recurrent CVA. Likely has PFO with positive transcranial doppler bubble study and prior Echo showing hypermobile IAS. Will arrange for TEE and refer to structural team for PFO closure.       Medication Adjustments/Labs and Tests Ordered: Current medicines are reviewed at length with the patient today.  Concerns regarding medicines are outlined above.  Orders Placed This Encounter  Procedures   CBC w/Diff   Basic metabolic panel with GFR   No orders of the defined types were placed in this encounter.   There are no Patient Instructions on file for this visit.    Plan follow up with me in 6 months  Signed, Bawi Lakins [place], MD  06/02/2024 8:40 AM    [facility]

## 2024-05-25 NOTE — H&P (View-Only) (Signed)
 Cardiology Office Note:    Date:  06/02/2024   ID:  Brendan [name], DOB 1961-02-24, MRN [id number]  PCP:  Default, Provider, MD   [facility] Providers Cardiologist:  Brendan Santelli [place], MD     Referring MD: No ref. provider found   Chief Complaint  Patient presents with   Stroke Symptoms    History of Present Illness:    Brendan [name] is a 63 y.o. male seen for follow up CAD. He had coronary and aortic calcification noted on CT. He has a history of Sarcoidosis, DM, HLD, and OSA on CPAP. He did have an Echo in March 2022 at [name] showing EF 50-55%. Mild LVH. Mild diastolic dysfunction. There was an interatrial septal aneurysm with shunt (PFO) but agitated saline. Mild MR. Cranial MRI at that time showed 2 old lacunar infarcts and a small old cortical infarct in the posterior inferior right occipital lobe. CTA of head and neck showed no significant stenosis. He was placed on ASA by Neurology then. He does note his activity has been limited since his bilateral TKR. He does have some SOB on exertion that he attributes to his sarcoidosis. His primary care (Brendan [name]) in [place] follows his lab work.  He underwent Myoview  study in September 2023. This  was high risk due to low EF 33%. No ischemia. Cardiac cath was then performed showing moderate 2 vessel CAD. Severe disease in small diagonal vessels. Normal right heart and LV filling pressures. Echo performed showed normal EF. 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 Given lack of symptoms and normal perfusion in Myoview  he was treated medically.   He presented to the emergency department on 02/28/2024 and was discharged on 02/29/2024. He reported right hand weakness. His workup in the ED was significant for acute CVA. He was admitted at that time. He was noted to have an embolus in the left precentral gyrus causing infarct. It was felt that his stroke was cryptogenic. CT head and neck showed moderate stenosis of his distal anterior and  inferior left M2 segment. Venous Dopplers showed no evidence of DVT. He underwent echocardiogram with bubble study which showed PFO. He was on aspirin  prior to admission and Plavix  was added to his medication regimen with plan to continue dual antiplatelet therapy for 3 weeks and then go to Plavix  alone. 30-day cardiac event monitor was recommended. Outpatient TEE was also recommended to evaluate for need of PFO closure. Event monitor was negative for Afib.   On follow up today he is doing well from a cardiac standpoint. No chest pain. Is followed by pulmonary for sarcoidosis and reactive airway disease. Reports sugars are OK. LDL was 49 in May. No recurrent stroke symptoms.   Past Medical History:  Diagnosis Date   Coronary artery disease    Diabetes mellitus without complication (HCC)    Hyperlipidemia    Hypertension    Kidney disease    Sarcoidosis    Stroke Brendan [name])     Past Surgical History:  Procedure Laterality Date   CARDIAC CATHETERIZATION     JOINT REPLACEMENT Bilateral    TKR   RIGHT/LEFT HEART CATH AND CORONARY ANGIOGRAPHY N/A 08/01/2022   Procedure: RIGHT/LEFT HEART CATH AND CORONARY ANGIOGRAPHY;  Surgeon: [name], Brittny Spangle M, MD;  Location: [facility] INVASIVE CV LAB;  Service: Cardiovascular;  Laterality: N/A;   ROTATOR CUFF REPAIR Left     Current Medications: Current Meds  Medication Sig   albuterol (VENTOLIN HFA) 108 (90 Base) MCG/ACT inhaler Inhale 1-2 puffs  into the lungs every 6 (six) hours as needed for shortness of breath or wheezing.   amLODipine (NORVASC) 10 MG tablet Take 1 tablet by mouth daily.   amoxicillin  (AMOXIL ) 500 MG capsule TAKE FOUR CAPSULES BY MOUTH ONCE 1 HOUR PRIOR TO THE DENTAL TREATMENT   atorvastatin  (LIPITOR ) 80 MG tablet Take 80 mg by mouth daily.   clobetasol cream (TEMOVATE) 0.05 %    clopidogrel  (PLAVIX ) 75 MG tablet Take 1 tablet (75 mg total) by mouth daily.   diazepam  (VALIUM ) 5 MG tablet Take 5 mg by mouth daily as needed for anxiety.    diclofenac Sodium (VOLTAREN) 1 % GEL Apply 2 g topically as needed (pain).   DULoxetine  (CYMBALTA ) 30 MG capsule Take 30 mg by mouth daily.   empagliflozin  (JARDIANCE ) 25 MG TABS tablet Take 0.5 tablets by mouth daily.   EPINEPHrine  0.3 mg/0.3 mL IJ SOAJ injection Inject 0.3 mg into the muscle as needed for anaphylaxis.   escitalopram  (LEXAPRO ) 10 MG tablet Take 10 mg by mouth at bedtime.   ezetimibe  (ZETIA ) 10 MG tablet Take 10 mg by mouth daily.   mometasone -formoterol  (DULERA) 100-5 MCG/ACT AERO Inhale 2 puffs into the lungs in the morning and at bedtime.   pantoprazole  (PROTONIX ) 40 MG tablet Take 1 tablet (40 mg total) by mouth daily.   Semaglutide ,0.25 or 0.5MG /DOS, 2 MG/3ML SOPN Inject 0.5 mg into the skin once a week. sunday   traZODone (DESYREL) 100 MG tablet Take 100 mg by mouth at bedtime.   Vitamin D , Ergocalciferol , (DRISDOL ) 1.25 MG (50000 UNIT) CAPS capsule Take 50,000 Units by mouth every 7 (seven) days. monday   zolpidem (AMBIEN) 10 MG tablet Take 10 mg by mouth at bedtime.     Allergies:   Latex, Lisinopril, Losartan, Metformin, and Sildenafil   Social History   Socioeconomic History   Marital status: Married    Spouse name: Not on file   Number of children: 3   Years of education: 16   Highest education level: Bachelor's degree (e.g., BA, AB, BS)  Occupational History   Occupation: Retired  Tobacco Use   Smoking status: Never   Smokeless tobacco: Never  Substance and Sexual Activity   Alcohol use: Not Currently   Drug use: Not Currently   Sexual activity: Not on file  Other Topics Concern   Not on file  Social History Narrative   Retired Art gallery manager for [employer]   Social Drivers of Health   Financial Resource Strain: Low Risk  (03/24/2024)   Received from [facility]   Overall Financial Resource Strain (CARDIA)    Difficulty of Paying Living Expenses: Not hard at all  Food Insecurity: No Food Insecurity (03/24/2024)   Received from [facility]   Hunger  Vital Sign    Within the past 12 months, you worried that your food would run out before you got the money to buy more.: Never true    Within the past 12 months, the food you bought just didn't last and you didn't have money to get more.: Never true  Transportation Needs: No Transportation Needs (03/24/2024)   Received from [facility] - Transportation    Lack of Transportation (Medical): No    Lack of Transportation (Non-Medical): No  Physical Activity: Not on file  Stress: Not on file  Social Connections: Unknown (02/27/2022)   Received from [facility]   Social Network    Social Network: Not on file     Family History: The patient's  family history includes Heart failure (age of onset: 60) in his mother.  ROS:   Please see the history of present illness.     All other systems reviewed and are negative.  EKGs/Labs/Other Studies Reviewed:    The following studies were reviewed today: CLINICAL DATA:  Sarcoidosis. Restrictive lung disease. Evaluate for interstitial lung disease.   EXAM: CT CHEST WITHOUT CONTRAST   TECHNIQUE: Multidetector CT imaging of the chest was performed following the standard protocol without intravenous contrast. High resolution imaging of the lungs, as well as inspiratory and expiratory imaging, was performed.   RADIATION DOSE REDUCTION: This exam was performed according to the departmental dose-optimization program which includes automated exposure control, adjustment of the mA and/or kV according to patient size and/or use of iterative reconstruction technique.   COMPARISON:  04/03/2022 chest radiograph.   FINDINGS: Cardiovascular: Normal heart size. No significant pericardial effusion/thickening. Three-vessel coronary atherosclerosis. Mildly atherosclerotic nonaneurysmal thoracic aorta. Normal caliber pulmonary arteries.   Mediastinum/Nodes: No discrete thyroid  nodules. Unremarkable esophagus. No axillary adenopathy. Shotty  nonenlarged paratracheal and prevascular mediastinal nodes. No pathologically enlarged mediastinal or discrete hilar nodes on these noncontrast images.   Lungs/Pleura: No pneumothorax. No pleural effusion. Several scattered small solid peribronchovascular the perifissural and subpleural pulmonary nodules in both lungs, for example a 0.5 cm right upper lobe peribronchovascular nodule (series 6/image 65) and a 0.5 cm left major fissure nodule (series 6/image 69). No acute consolidative airspace disease or lung masses. No significant regions of subpleural reticulation, ground-glass opacity, traction bronchiectasis, architectural distortion or frank honeycombing. No significant lobular air trapping or evidence of tracheobronchomalacia on the expiration sequence.   Upper abdomen: Subcentimeter posterior right liver dome hypodense lesion is too small to characterize.   Musculoskeletal: No aggressive appearing focal osseous lesions. Mild thoracic spondylosis.   IMPRESSION: 1. Scattered perilymphatic distribution small solid pulmonary nodules, largest 0.5 cm, compatible with pulmonary sarcoidosis. 2. No evidence of interstitial lung disease. 3. Three-vessel coronary atherosclerosis. 4. Aortic Atherosclerosis (ICD10-I70.0).     Electronically Signed   By: Selinda DELENA Blue [name].D.   On: 06/17/2022 15:38   Myoview  07/25/22: Study Highlights      Findings are consistent with no ischemia. The study is high risk.   No ST deviation was noted.   LV perfusion is normal. There is no evidence of ischemia. There is no evidence of infarction.   Left ventricular function is abnormal. Global function is moderately reduced. Nuclear stress EF: 33 %. The left ventricular ejection fraction is moderately decreased (30-44%). End diastolic cavity size is mildly enlarged. End systolic cavity size is moderately enlarged.   Prior study not available for comparison.   Normal perfusion study, but EF measures at 33%  and visually appears hypokinetic. Recommend echo to more accurately evaluate wall motion and function.    Cardiac cath 08/01/22:  RIGHT/LEFT HEART CATH AND CORONARY ANGIOGRAPHY   Conclusion      Mid LAD lesion is 70% stenosed.   1st Diag lesion is 90% stenosed.   2nd Diag lesion is 90% stenosed.   Prox Cx to Mid Cx lesion is 75% stenosed.   Prox RCA lesion is 40% stenosed.   LV end diastolic pressure is normal.   2 vessel obstructive CAD. There is severe disease in 2 small diagonal vessels. Moderate disease in the mid LAD and mid LCx.  Normal LV filling pressures. LVEDP 12 mm Hg. PCWP 12/10 with mean 10 mm Hg. Normal right heart pressures. PAP 22/10 with mean 16 mm Hg Normal  cardiac output 7.1 L/min with index 3.33   Plan: will obtain an Echo to better assess LV function. I don't think his degree of CAD can explain that low EF. If low EF is confirmed on Echo will optimize CHF therapy. The patient has no active angina and perfusion on Myoview  is Ok so I would not pursue revascularization at this point.   Coronary Diagrams  Diagnostic Dominance: Right  Intervention  Echo 08/14/22: IMPRESSIONS     1. Left ventricular ejection fraction, by estimation, is 60 to 65%. The  left ventricle has normal function. The left ventricle has no regional  wall motion abnormalities. Left ventricular diastolic parameters were  normal.   2. Right ventricular systolic function is normal. The right ventricular  size is normal.   3. Interatrial septum is hypermobile No PFO seen by color doppler.   4. The mitral valve is normal in structure. Mild mitral valve  regurgitation.   5. The aortic valve is normal in structure. Aortic valve regurgitation is  not visualized.   6. The inferior vena cava is normal in size with greater than 50%  respiratory variability, suggesting right atrial pressure of 3 mmHg.        Recent Labs: 02/27/2024: ALT 29; Platelets 175 02/28/2024: BUN 10; Creatinine, Ser 1.20;  Hemoglobin 13.9; Potassium 3.8; Sodium 137  Recent Lipid Panel    Component Value Date/Time   CHOL 97 02/27/2024 2315   TRIG 65 02/27/2024 2315   HDL 35 (L) 02/27/2024 2315   CHOLHDL 2.8 02/27/2024 2315   VLDL 13 02/27/2024 2315   LDLCALC 49 02/27/2024 2315     Risk Assessment/Calculations:                Physical Exam:    VS:  BP 112/68   Pulse 75   Ht 5' 10 (1.778 [name])   Wt 207 lb 3.2 oz (94 kg)   SpO2 99%   BMI 29.73 kg/[name]     Wt Readings from Last 3 Encounters:  06/02/24 207 lb 3.2 oz (94 kg)  03/23/24 202 lb 12.8 oz (92 kg)  08/20/23 210 lb 6.4 oz (95.4 kg)     GEN:  Well nourished, well developed in no acute distress HEENT: Normal NECK: No JVD; No carotid bruits LYMPHATICS: No lymphadenopathy CARDIAC: RRR, no murmurs, rubs, gallops RESPIRATORY:  Clear to auscultation without rales, wheezing or rhonchi  ABDOMEN: Soft, non-tender, non-distended MUSCULOSKELETAL:  No edema; No deformity  SKIN: Warm and dry NEUROLOGIC:  Alert and oriented x 3 PSYCHIATRIC:  Normal affect   ASSESSMENT:    1. Pre-procedure lab exam   2. Cryptogenic stroke (HCC)   3. Coronary artery disease of native artery of native heart with stable angina pectoris (HCC)   4. Type 2 diabetes mellitus without complication, without long-term current use of insulin  (HCC)   5. Primary hypertension       PLAN:    In order of problems listed above:  CAD. Multiple coronary risk factors.   Myoview  was high risk due to low EF but showed normal perfusion. Cath showed severe disease in small diagonal branches. Moderate LAD and LCx disease. Given lack of ischemia on stress test and no angina medical therapy recommended. Stressed importance of risk factor modification. He is asymptomatic. Low EF noted on Myoview  study but by Echo EF is normal. No additional therapy needed.  DM on oral therapy. Intolerance of metformin. Now on Jardiance  and Ozempic . Reports A1c at [name] 6.8% HLD. On statin. LDL  at goal  49.  HTN. Currently on amlodipine with good control  OSA on CPAP Sarcoidosis. Per pulmonary. No overt evidence of cardiac sarcoid. On Dulera for reactive airway disease.  History of recurrent CVA. Likely has PFO with positive transcranial doppler bubble study and prior Echo showing hypermobile IAS. Will arrange for TEE and refer to structural team for PFO closure.       Medication Adjustments/Labs and Tests Ordered: Current medicines are reviewed at length with the patient today.  Concerns regarding medicines are outlined above.  Orders Placed This Encounter  Procedures   CBC w/Diff   Basic metabolic panel with GFR   No orders of the defined types were placed in this encounter.   There are no Patient Instructions on file for this visit.    Plan follow up with me in 6 months  Signed, Bawi Lakins [place], MD  06/02/2024 8:40 AM    [facility]

## 2024-06-02 ENCOUNTER — Encounter: Payer: Self-pay | Admitting: Cardiology

## 2024-06-02 ENCOUNTER — Telehealth: Payer: Self-pay

## 2024-06-02 ENCOUNTER — Other Ambulatory Visit: Payer: Self-pay | Admitting: Cardiology

## 2024-06-02 ENCOUNTER — Ambulatory Visit: Attending: Cardiology | Admitting: Cardiology

## 2024-06-02 VITALS — BP 112/68 | HR 75 | Ht 70.0 in | Wt 207.2 lb

## 2024-06-02 DIAGNOSIS — I639 Cerebral infarction, unspecified: Secondary | ICD-10-CM | POA: Diagnosis not present

## 2024-06-02 DIAGNOSIS — I10 Essential (primary) hypertension: Secondary | ICD-10-CM | POA: Diagnosis not present

## 2024-06-02 DIAGNOSIS — I25118 Atherosclerotic heart disease of native coronary artery with other forms of angina pectoris: Secondary | ICD-10-CM

## 2024-06-02 DIAGNOSIS — Z01812 Encounter for preprocedural laboratory examination: Secondary | ICD-10-CM

## 2024-06-02 DIAGNOSIS — E119 Type 2 diabetes mellitus without complications: Secondary | ICD-10-CM

## 2024-06-02 LAB — CBC WITH DIFFERENTIAL/PLATELET

## 2024-06-02 NOTE — Patient Instructions (Addendum)
 Medication Instructions:  Continue same medications *If you need a refill on your cardiac medications before your next appointment, please call your pharmacy*  Lab Work: Bmet,cbc today  Testing/Procedures: TEE scheduled at [facility]  Wed 8/13 Arrive at 8:30 am  Follow instructions below  Follow-Up: At [facility], you and your health needs are our priority.  As part of our continuing mission to provide you with exceptional heart care, our providers are all part of one team.  This team includes your primary Cardiologist (physician) and Advanced Practice Providers or APPs (Physician Assistants and Nurse Practitioners) who all work together to provide you with the care you need, when you need it.  Your next appointment:  6 months   Call in Oct to schedule Feb appointment     Provider:  Dr.Jordan       Appointment will be scheduled with Structural Heart Team      You are scheduled for a TEE (Transesophageal Echocardiogram) on Wednesday, August 13 with Dr. Lonni.  Please arrive at the [facility] (Main Entrance A) at [facility]: [address] [place], [place] [address] at 8:30 AM (This time is 1 hour(s) before your procedure to ensure your preparation).   Free valet parking service is available. You will check in at ADMITTING.   *Please Note: You will receive a call the day before your procedure to confirm the appointment time. That time may have changed from the original time based on the schedule for that day.*   DIET:  Nothing to eat or drink after midnight except a sip of water with medications (see medication instructions below)  MEDICATION INSTRUCTIONS:  Hold Jardiance  3 days before procedure  LABS: bmet ,cbc today   FYI:  For your safety, and to allow us  to monitor your vital signs accurately during the surgery/procedure we request: If you have artificial nails, gel coating, SNS etc, please have those removed prior to your surgery/procedure.  Not having the nail coverings /polish removed may result in cancellation or delay of your surgery/procedure.  Your support person will be asked to wait in the waiting room during your procedure.  It is OK to have someone drop you off and come back when you are ready to be discharged.  You cannot drive after the procedure and will need someone to drive you home.  Bring your insurance cards.  *Special Note: Every effort is made to have your procedure done on time. Occasionally there are emergencies that occur at the hospital that may cause delays. Please be patient if a delay does occur.         We recommend signing up for the patient portal called MyChart.  Sign up information is provided on this After Visit Summary.  MyChart is used to connect with patients for Virtual Visits (Telemedicine).  Patients are able to view lab/test results, encounter notes, upcoming appointments, etc.  Non-urgent messages can be sent to your provider as well.   To learn more about what you can do with MyChart, go to [email or web address].

## 2024-06-02 NOTE — Addendum Note (Signed)
 Addended by: CHRISTIANNE CHANNING PARAS on: 06/02/2024 09:58 AM   Modules accepted: Orders

## 2024-06-02 NOTE — Telephone Encounter (Signed)
 Per Dr. [place], called to arrange Structural Heart consult after TEE next week.  Left message to call back.

## 2024-06-03 ENCOUNTER — Ambulatory Visit: Payer: Self-pay | Admitting: Cardiology

## 2024-06-03 LAB — CBC WITH DIFFERENTIAL/PLATELET
Basos: 1
EOS (ABSOLUTE): 0.1 x10E3/uL (ref 0.0–0.2)
Eos: 5
Hematocrit: 43.9 (ref 37.5–51.0)
Hemoglobin: 14.3 g/dL (ref 13.0–17.7)
Immature Granulocytes: 0
Immature Granulocytes: 0 x10E3/uL (ref 0.0–0.1)
Lymphs: 37
MCH: 29.8 pg (ref 26.6–33.0)
MCHC: 32.6 g/dL (ref 31.5–35.7)
MCV: 92 fL (ref 79–97)
Monocytes Absolute: 0.3 x10E3/uL (ref 0.0–0.4)
Monocytes Absolute: 0.4 x10E3/uL (ref 0.1–0.9)
Monocytes: 7
Neutrophils Absolute: 2.4 x10E3/uL (ref 0.7–3.1)
Neutrophils Absolute: 3.2 x10E3/uL (ref 1.4–7.0)
Neutrophils: 50
Platelets: 220 x10E3/uL (ref 150–450)
RBC: 4.8 x10E6/uL (ref 4.14–5.80)
RDW: 13 (ref 11.6–15.4)
WBC: 6.4 x10E3/uL (ref 3.4–10.8)

## 2024-06-03 LAB — BASIC METABOLIC PANEL WITH GFR
BUN/Creatinine Ratio: 10 (ref 10–24)
BUN: 15 mg/dL (ref 8–27)
CO2: 22 mmol/L (ref 20–29)
Calcium: 10.5 mg/dL — AB (ref 8.6–10.2)
Chloride: 102 mmol/L (ref 96–106)
Creatinine, Ser: 1.45 mg/dL — AB (ref 0.76–1.27)
Glucose: 119 mg/dL — AB (ref 70–99)
Potassium: 4.3 mmol/L (ref 3.5–5.2)
Sodium: 140 mmol/L (ref 134–144)
eGFR: 54 mL/min/1.73 — AB (ref >59)

## 2024-06-03 NOTE — Telephone Encounter (Signed)
 Scheduled the patient with Dr. Wendel 06/16/2024. He was grateful for call and agreed with plan.

## 2024-06-04 NOTE — Progress Notes (Unsigned)
 Cardiology Office Note:   Date:  06/16/2024  ID:  Brendan [name], DOB [birth year], MRN [id number] PCP:  Brendan [name]  [facility] Providers Cardiologist:  Brendan Haws, MD Referring MD: [name], Brendan M, MD  Chief Complaint/Reason for Referral: Recurrent CVA ASSESSMENT:    1. Recurrent strokes (HCC)   2. Coronary artery disease of native artery of native heart with stable angina pectoris (HCC)   3. Type 2 diabetes mellitus without complication, without long-term current use of insulin  (HCC)   4. Hyperlipidemia associated with type 2 diabetes mellitus (HCC)   5. Aortic atherosclerosis (HCC)   6. Hypertension associated with diabetes (HCC)   7. CKD stage 3 due to type 2 diabetes mellitus (HCC)   8. Sarcoidosis   9. BMI 29.0-29.9,adult     PLAN:   In order of problems listed above: Recurrent stroke: The patient has high risk anatomy with a PFO and intra-atrial septal aneurysm.  I spoke to him and his wife about the fact that he is outside the inclusion criteria for the PFO closure trials but given his recurrent strokes I think it is advisable to consider closure.  If he pursues closure we will continue dual antiplatelet therapy with aspirin  and Plavix  for 6 months followed by aspirin  monotherapy indefinitely.  He will need antibiotics for any dental work including cleanings for 6 months after the procedure.  There is about a 20% risk of developing atrial fibrillation after the procedure due to device healing.  Given his advanced age and CHA2DS2-VASc score, he would require indefinite anticoagulation.  The patient and his wife will think about my recommendations and get back to us  about whether he would like to pursue therapy or not. CAD: Continue Plavix  75 mg, atorvastatin  80 mg T2DM: Continue Plavix  75 mg, atorvastatin  80 mg, Jardiance  25 mg, defer ARB given patient intolerance. Hyperlipidemia: Continue atorvastatin  80 mg, Zetia  10 mg: Goal LDL is less than 55 given history  of recurrent stroke.  LDL in May was 49. Aortic atherosclerosis: Continue Plavix  75 mg, atorvastatin  80 mg, Zetia  10 mg Hypertension: Continue amlodipine 10 mg, intolerant of ACE or ARB; BP is well-controlled. CKD stage III: Continue Jardiance  25 mg; patient intolerant of ACE or ARB. Sarcoidosis: Followed by other providers. Elevated BMI: Continue semaglutide  0.5 mg weekly            Dispo:  Return if symptoms worsen or fail to improve.       I spent 48 minutes reviewing all clinical data during and prior to this visit including all relevant imaging studies, laboratories, clinical information from other health systems and prior notes from both Cardiology and other specialties, interviewing the patient, conducting a complete physical examination, and coordinating care in order to formulate a comprehensive and personalized evaluation and treatment plan.   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 The patient is 63 year old male with the above listed medical problems referred by Dr. Swaziland for recommendations regarding history of recurrent stroke.  He was seen in May  after his third TIA/strokelike event.  Neurology had seen him in remarked that PFO closure should be pursued.  He saw his primary cardiologist Dr. Swaziland and he was referred for further recommendations.    He has a history of high blood pressure, managed with amlodipine, and high cholesterol, for which he is on medication. He also has diabetes, controlled with Jardiance  and dietary measures, and sarcoidosis, currently in remission, managed with a [facility] inhaler as needed and Dulera, two puffs in the morning and two in the afternoon. No recent episodes of numbness, palpitations, chest pain, or shortness of breath. He reports no swelling in his legs, although his knees remain swollen following knee replacements. He prefers not to lie on his back due to discomfort, not related to shortness of breath or back pain. He has a history of a locomotor seizure in 1980, but the cause was never determined. He reports no recent bleeding issues such as epistaxis or melena.     Current Medications: Current Meds  Medication Sig   albuterol (VENTOLIN HFA) 108 (90 Base) MCG/ACT inhaler Inhale 1-2 puffs into the lungs every 6 (six) hours as needed for shortness of breath or wheezing.   amLODipine (NORVASC) 10 MG tablet Take 10 mg by mouth daily.   amoxicillin  (AMOXIL ) 500 MG capsule TAKE FOUR CAPSULES BY MOUTH ONCE 1 HOUR PRIOR TO THE DENTAL TREATMENT   atorvastatin  (LIPITOR ) 80 MG tablet Take 80 mg by mouth at bedtime.   clobetasol cream (TEMOVATE) 0.05 % Apply 1 Application topically 3 (three) times a week.   clopidogrel  (PLAVIX ) 75 MG tablet Take 1 tablet (75 mg total) by mouth daily.   diclofenac Sodium (VOLTAREN) 1 % GEL Apply 2 g topically daily as needed (Shoulder paon).   donepezil (ARICEPT) 10 MG tablet Take 10 mg by mouth at bedtime.   DULoxetine  (CYMBALTA ) 30 MG capsule Take 30 mg by mouth daily.   empagliflozin  (JARDIANCE ) 25 MG TABS tablet Take 12.5 tablets by mouth daily.   EPINEPHrine  0.3 mg/0.3 mL IJ  SOAJ injection Inject 0.3 mg into the muscle as needed for anaphylaxis.   escitalopram  (LEXAPRO ) 10 MG tablet Take 10 mg by mouth at bedtime.   ezetimibe  (ZETIA ) 10 MG tablet Take 10 mg by mouth daily.   mometasone -formoterol  (DULERA) 100-5 MCG/ACT AERO Inhale 2 puffs into the lungs in the morning and at bedtime.   pantoprazole  (PROTONIX ) 40 MG tablet Take 1 tablet (40 mg total) by mouth daily.   Semaglutide ,0.25 or 0.5MG /DOS, 2 MG/3ML SOPN Inject 0.5 mg into the skin once a week. sunday   traZODone (DESYREL) 100 MG tablet Take 100 mg by mouth at bedtime.   Vitamin D , Ergocalciferol , (DRISDOL ) 1.25 MG (50000 UNIT) CAPS capsule Take 50,000 Units by mouth every 7 (seven) days. monday   zolpidem (AMBIEN) 10 MG tablet Take 10 mg by mouth at bedtime as needed for sleep.     Review of Systems:   Please see the history of present illness.    All other systems reviewed and are negative.     EKGs/Labs/Other Test Reviewed:   EKG: 2025 normal sinus rhythm with left axis deviation  EKG Interpretation Date/Time:    Ventricular Rate:    PR  Interval:    QRS Duration:    QT Interval:    QTC Calculation:   R Axis:      Text Interpretation:          CARDIAC STUDIES: Refer to CV Procedures and Imaging Tabs   Risk Assessment/Calculations:          Physical Exam:   VS:  BP (!) 108/56   Pulse 68   Ht 5' 10 (1.778 m)   Wt 206 lb 3.2 oz (93.5 kg)   SpO2 98%   BMI 29.59 kg/m        Wt Readings from Last 3 Encounters:  06/16/24 206 lb 3.2 oz (93.5 kg)  06/09/24 204 lb (92.5 kg)  06/02/24 207 lb 3.2 oz (94 kg)      GENERAL:  No apparent distress, AOx3 HEENT:  No carotid bruits, +2 carotid impulses, no scleral icterus CAR: RRR no murmurs, gallops, rubs, or thrills RES:  Clear to auscultation bilaterally ABD:  Soft, nontender, nondistended, positive bowel sounds x 4 VASC:  +2 radial pulses, +2 carotid pulses NEURO:  CN 2-12 grossly intact; motor and sensory grossly intact PSYCH:   No active depression or anxiety EXT:  No edema, ecchymosis, or cyanosis  Signed, Medardo Hassing K Ashraf Mesta, MD  06/16/2024 2:17 PM    [facility] [address] [place], [place], [place]  [address] Phone: [phone]; Fax: [phone]   Note:  This document was prepared using Dragon voice recognition software and may include unintentional dictation errors.

## 2024-06-08 NOTE — Progress Notes (Signed)
 Pt called for pre procedure instructions.  Message left on identified voice mail. Arrival time 0830 NPO after midnight explained Instructed to take am meds with sip of water and confirmed blood thinner consistency Instructed pt need for ride home tomorrow and have responsible adult with them for 24 hrs post procedure.

## 2024-06-08 NOTE — Anesthesia Preprocedure Evaluation (Deleted)
 Anesthesia Evaluation    Reviewed: Allergy & Precautions, Patient's Chart, lab work & pertinent test results  History of Anesthesia Complications Negative for: history of anesthetic complications  Airway        Dental   Pulmonary   Sarcoidosis           Cardiovascular hypertension, Pt. on medications + CAD       Neuro/Psych CVA  negative psych ROS   GI/Hepatic Neg liver ROS,GERD  ,,  Endo/Other  diabetes, Type 2, Oral Hypoglycemic Agents    Renal/GU negative Renal ROS     Musculoskeletal negative musculoskeletal ROS (+)    Abdominal   Peds  Hematology  On plavix     Anesthesia Other Findings On GLP-1a   Reproductive/Obstetrics                              Anesthesia Physical Anesthesia Plan  ASA: 4  Anesthesia Plan: MAC   Post-op Pain Management: Minimal or no pain anticipated   Induction:   PONV Risk Score and Plan: 1 and Propofol  infusion and Treatment may vary due to age or medical condition  Airway Management Planned: Nasal Cannula and Natural Airway  Additional Equipment: None  Intra-op Plan:   Post-operative Plan:   Informed Consent:   Plan Discussed with: CRNA and Anesthesiologist  Anesthesia Plan Comments: (Patient took GLP-1a 3 days ago. Patient and cardiologist agreeable to delay case)         Anesthesia Quick Evaluation

## 2024-06-09 ENCOUNTER — Ambulatory Visit (HOSPITAL_COMMUNITY)

## 2024-06-09 ENCOUNTER — Encounter (HOSPITAL_COMMUNITY)
Admission: RE | Disposition: A | Discharge status: Home / Self Care | Payer: Self-pay | Source: Home / Self Care | Attending: Cardiology

## 2024-06-09 ENCOUNTER — Encounter (HOSPITAL_COMMUNITY): Payer: Self-pay | Admitting: Cardiology

## 2024-06-09 ENCOUNTER — Encounter (HOSPITAL_COMMUNITY): Payer: Self-pay | Admitting: Anesthesiology

## 2024-06-09 ENCOUNTER — Other Ambulatory Visit: Payer: Self-pay

## 2024-06-09 ENCOUNTER — Ambulatory Visit (HOSPITAL_COMMUNITY)
Admission: RE | Admit: 2024-06-09 | Discharge: 2024-06-09 | Disposition: A | Discharge status: Home / Self Care | Attending: Cardiology | Admitting: Cardiology

## 2024-06-09 DIAGNOSIS — I639 Cerebral infarction, unspecified: Secondary | ICD-10-CM

## 2024-06-09 SURGERY — TRANSESOPHAGEAL ECHOCARDIOGRAM (TEE) (CATHLAB)
Anesthesia: Monitor Anesthesia Care

## 2024-06-09 MED ORDER — SODIUM CHLORIDE 0.9 % IV SOLN: INTRAVENOUS | Status: DC

## 2024-06-09 NOTE — Interval H&P Note (Signed)
 History and Physical Interval Note:  06/09/2024 8:53 AM  Brendan [name]  has presented today for surgery, with the diagnosis of PFO.  The various methods of treatment have been discussed with the patient and family. After consideration of risks, benefits and other options for treatment, the patient has consented to  Procedure(s): TRANSESOPHAGEAL ECHOCARDIOGRAM (N/A) as a surgical intervention.  The patient's history has been reviewed, patient examined, no change in status, stable for surgery.  I have reviewed the patient's chart and labs.  Questions were answered to the patient's satisfaction.    Unfortunately patient was not aware that he needed to hold his GLP injectable for a week prior to his procedure, and his last dose was 06/06/24. After discussion with myself and Dr. Lucious, options were to proceed today but with intubation or reschedule for next week. Patient would like to avoid intubation, and so procedure will be rescheduled for next week. He will not dose his GLP again until after the procedure.   Deyana Wnuk Lonni

## 2024-06-14 NOTE — Progress Notes (Signed)
 Called patient with pre-procedure instructions for tomorrow.   Patient informed of:   Time to arrive for procedure (0900). Remain NPO past midnight.  Must have a ride home and a responsible adult to remain with them for 24 hours post procedure. Instructed to take am meds with sip of water. Confirmed patient stopped GLP-1s/GLP-2s for at least one week/three days before procedure.  Patient reports understanding and no other questions at this time.

## 2024-06-15 ENCOUNTER — Ambulatory Visit (HOSPITAL_BASED_OUTPATIENT_CLINIC_OR_DEPARTMENT_OTHER)

## 2024-06-15 ENCOUNTER — Ambulatory Visit (HOSPITAL_COMMUNITY): Admitting: Anesthesiology

## 2024-06-15 ENCOUNTER — Encounter (HOSPITAL_COMMUNITY)
Admission: RE | Disposition: A | Discharge status: Home / Self Care | Payer: Self-pay | Source: Home / Self Care | Attending: Internal Medicine

## 2024-06-15 ENCOUNTER — Ambulatory Visit (HOSPITAL_COMMUNITY)
Admission: RE | Admit: 2024-06-15 | Discharge: 2024-06-15 | Disposition: A | Discharge status: Home / Self Care | Attending: Internal Medicine | Admitting: Internal Medicine

## 2024-06-15 ENCOUNTER — Encounter (HOSPITAL_COMMUNITY): Payer: Self-pay | Admitting: Internal Medicine

## 2024-06-15 ENCOUNTER — Other Ambulatory Visit: Payer: Self-pay

## 2024-06-15 DIAGNOSIS — E785 Hyperlipidemia, unspecified: Secondary | ICD-10-CM

## 2024-06-15 DIAGNOSIS — E119 Type 2 diabetes mellitus without complications: Secondary | ICD-10-CM | POA: Diagnosis not present

## 2024-06-15 DIAGNOSIS — I253 Aneurysm of heart: Secondary | ICD-10-CM | POA: Diagnosis not present

## 2024-06-15 DIAGNOSIS — Q2112 Patent foramen ovale: Secondary | ICD-10-CM | POA: Diagnosis present

## 2024-06-15 DIAGNOSIS — Z8673 Personal history of transient ischemic attack (TIA), and cerebral infarction without residual deficits: Secondary | ICD-10-CM | POA: Insufficient documentation

## 2024-06-15 DIAGNOSIS — I25118 Atherosclerotic heart disease of native coronary artery with other forms of angina pectoris: Secondary | ICD-10-CM | POA: Diagnosis not present

## 2024-06-15 DIAGNOSIS — G4733 Obstructive sleep apnea (adult) (pediatric): Secondary | ICD-10-CM | POA: Insufficient documentation

## 2024-06-15 DIAGNOSIS — I700 Atherosclerosis of aorta: Secondary | ICD-10-CM | POA: Diagnosis not present

## 2024-06-15 DIAGNOSIS — Z79899 Other long term (current) drug therapy: Secondary | ICD-10-CM | POA: Diagnosis not present

## 2024-06-15 DIAGNOSIS — Z7982 Long term (current) use of aspirin: Secondary | ICD-10-CM | POA: Diagnosis not present

## 2024-06-15 DIAGNOSIS — Z7985 Long-term (current) use of injectable non-insulin antidiabetic drugs: Secondary | ICD-10-CM | POA: Diagnosis not present

## 2024-06-15 DIAGNOSIS — I639 Cerebral infarction, unspecified: Secondary | ICD-10-CM | POA: Diagnosis not present

## 2024-06-15 DIAGNOSIS — Z96653 Presence of artificial knee joint, bilateral: Secondary | ICD-10-CM | POA: Diagnosis not present

## 2024-06-15 DIAGNOSIS — Z7984 Long term (current) use of oral hypoglycemic drugs: Secondary | ICD-10-CM | POA: Diagnosis not present

## 2024-06-15 DIAGNOSIS — I10 Essential (primary) hypertension: Secondary | ICD-10-CM | POA: Diagnosis not present

## 2024-06-15 DIAGNOSIS — I2510 Atherosclerotic heart disease of native coronary artery without angina pectoris: Secondary | ICD-10-CM | POA: Diagnosis not present

## 2024-06-15 DIAGNOSIS — D869 Sarcoidosis, unspecified: Secondary | ICD-10-CM | POA: Insufficient documentation

## 2024-06-15 DIAGNOSIS — Z7902 Long term (current) use of antithrombotics/antiplatelets: Secondary | ICD-10-CM | POA: Insufficient documentation

## 2024-06-15 HISTORY — PX: TRANSESOPHAGEAL ECHOCARDIOGRAM (CATH LAB): EP1270

## 2024-06-15 LAB — ECHO TEE

## 2024-06-15 LAB — GLUCOSE, CAPILLARY: Glucose-Capillary: 113 mg/dL — ABNORMAL HIGH (ref 70–99)

## 2024-06-15 SURGERY — TRANSESOPHAGEAL ECHOCARDIOGRAM (TEE) (CATHLAB)
Anesthesia: Monitor Anesthesia Care

## 2024-06-15 MED ORDER — SODIUM CHLORIDE 0.9 % IV SOLN: INTRAVENOUS | Status: DC

## 2024-06-15 MED ORDER — PROPOFOL 10 MG/ML IV BOLUS
INTRAVENOUS | Status: DC | PRN
  Administered 2024-06-15: 50 ug/kg/min via INTRAVENOUS
  Administered 2024-06-15: 100 mg via INTRAVENOUS
  Administered 2024-06-15 (×2): 30 mg via INTRAVENOUS

## 2024-06-15 NOTE — Discharge Instructions (Signed)

## 2024-06-15 NOTE — Transfer of Care (Signed)
 Immediate Anesthesia Transfer of Care Note  Patient: Brendan [name]  Procedure(s) Performed: TRANSESOPHAGEAL ECHOCARDIOGRAM  Patient Location: PACU and Cath Lab  Anesthesia Type:MAC  Level of Consciousness: awake and alert   Airway & Oxygen Therapy: Patient Spontanous Breathing and Patient connected to nasal cannula oxygen  Post-op Assessment: Report given to RN and Post -op Vital signs reviewed and stable  Post vital signs: Reviewed and stable  Last Vitals:  Vitals Value Taken Time  BP 109/78 06/15/24 10:45  Temp 36.7 C 06/15/24 10:44  Pulse 102 06/15/24 10:47  Resp 29 06/15/24 10:47  SpO2 98 % 06/15/24 10:47  Vitals shown include unfiled device data.  Last Pain:  Vitals:   06/15/24 1044  TempSrc: Tympanic  PainSc: Asleep         Complications: No notable events documented.

## 2024-06-15 NOTE — CV Procedure (Signed)
 TRANSESOPHAGEAL ECHOCARDIOGRAM (TEE) NOTE  INDICATIONS: Stroke, evaluate for PFO  PROCEDURE:   Informed consent was obtained prior to the procedure. The risks, benefits and alternatives for the procedure were discussed and the patient comprehended these risks.  Risks include, but are not limited to, cough, sore throat, vomiting, nausea, somnolence, esophageal and stomach trauma or perforation, bleeding, low blood pressure, aspiration, pneumonia, infection, trauma to the teeth and death.    After a procedural time-out, the patient was given propofol  for sedation by anesthesia. See their separate report.  The patient's heart rate, blood pressure, and oxygen saturation are monitored continuously during the procedure.The oropharynx was anesthetized with topical cetacaine.  The transesophageal probe was inserted in the esophagus and stomach without difficulty and multiple views were obtained.  The patient was kept under observation until the patient left the procedure room.  I was present face-to-face 100% of this time. The patient left the procedure room in stable condition.   Agitated microbubble saline contrast was administered twice.  COMPLICATIONS:    There were no immediate complications.  Findings:  LEFT VENTRICLE: The left ventricular wall thickness is normal.  The left ventricular cavity is normal in size. Wall motion is normal.  LVEF is 60-65%.  RIGHT VENTRICLE:  The right ventricle is normal in structure and function without any thrombus or masses.    LEFT ATRIUM:  The left atrium is normal in size without any thrombus or masses.  There is not spontaneous echo contrast (smoke) in the left atrium consistent with a low flow state.  LEFT ATRIAL APPENDAGE:  The left atrial appendage is free of any thrombus or masses. The appendage has single lobes. Pulse doppler indicates moderate flow in the appendage.  ATRIAL SEPTUM:  The atrial septum is aneurysmal with more than 2 cm of left  to right excursion.  There is evidence for interatrial shunting by saline microbubble with cough valsalva - suggestive of a moderate-sized right to left shunt.  RIGHT ATRIUM:  The right atrium is normal in size and function without any thrombus or masses.  MITRAL VALVE:  The mitral valve is normal in structure and function with trivial to mild regurgitation.  There were no vegetations or stenosis.  AORTIC VALVE:  The aortic valve is trileaflet, normal in structure and function with no regurgitation.  There were no vegetations or stenosis  TRICUSPID VALVE:  The tricuspid valve is normal in structure and function with no regurgitation.  There were no vegetations or stenosis   PULMONIC VALVE:  The pulmonic valve is normal in structure and function with no regurgitation.  There were no vegetations or stenosis.   AORTIC ARCH, ASCENDING AND DESCENDING AORTA:  There was no Shaune et. Al, 1992) atherosclerosis of the ascending aorta, aortic arch, or proximal descending aorta.  12. PULMONARY VEINS: Anomalous pulmonary venous return was not noted.  13. PERICARDIUM: The pericardium appeared normal and non-thickened.  There is no pericardial effusion.  IMPRESSION:   Atrial septal aneurysm with moderate sized PFO, demonstrating right to left shunting with valsalva.  No LAA thrombus LVEF 60-65%  RECOMMENDATIONS:    Given history of recurrent stroke and moderate-sized PFO with valsalva, would recommend structural heart team evaluation for PFO closure.  Time Spent Directly with the Patient:  45 minutes   Vinie KYM Maxcy, MD, [facility], FNLA, FACP  [facility]  Medical Director of the [facility] Lipid Disorders &  Cardiovascular Risk Reduction Clinic Diplomate of the American Board of Clinical Lipidology  Attending Cardiologist  Direct Dial: [phone]  Fax: [phone]  Website:  [email or web address] Vinie BROCKS Wannetta Langland 06/15/2024, 10:38 AM

## 2024-06-15 NOTE — Anesthesia Postprocedure Evaluation (Signed)
 Anesthesia Post Note  Patient: Brendan [name]  Procedure(s) Performed: TRANSESOPHAGEAL ECHOCARDIOGRAM     Patient location during evaluation: PACU Anesthesia Type: MAC Level of consciousness: awake Pain management: pain level controlled Vital Signs Assessment: post-procedure vital signs reviewed and stable Respiratory status: spontaneous breathing, nonlabored ventilation and respiratory function stable Cardiovascular status: stable and blood pressure returned to baseline Postop Assessment: no apparent nausea or vomiting Anesthetic complications: no   No notable events documented.  Last Vitals:  Vitals:   06/15/24 1054 06/15/24 1104  BP: 109/78 118/79  Pulse: 91 87  Resp: 16 13  Temp:    SpO2: 99% 99%    Last Pain:  Vitals:   06/15/24 1104  TempSrc:   PainSc: 0-No pain                 [name]

## 2024-06-15 NOTE — Anesthesia Preprocedure Evaluation (Addendum)
 Anesthesia Evaluation  Patient identified by MRN, date of birth, ID band Patient awake    Reviewed: Allergy & Precautions, NPO status , Patient's Chart, lab work & pertinent test results  History of Anesthesia Complications Negative for: history of anesthetic complications  Airway Mallampati: III  TM Distance: >3 FB Neck ROM: Full    Dental  (+) Partial Upper, Dental Advisory Given   Pulmonary neg shortness of breath, neg sleep apnea, neg COPD, neg recent URI Sarcoidosis    Pulmonary exam normal breath sounds clear to auscultation       Cardiovascular hypertension, (-) angina + CAD  (-) Past MI, (-) Cardiac Stents and (-) CABG (-) dysrhythmias  Rhythm:Regular Rate:Normal  PFO, HLD  TTE 08/14/2022: IMPRESSIONS    1. Left ventricular ejection fraction, by estimation, is 60 to 65%. The  left ventricle has normal function. The left ventricle has no regional  wall motion abnormalities. Left ventricular diastolic parameters were  normal.   2. Right ventricular systolic function is normal. The right ventricular  size is normal.   3. Interatrial septum is hypermobile No PFO seen by color doppler.   4. The mitral valve is normal in structure. Mild mitral valve  regurgitation.   5. The aortic valve is normal in structure. Aortic valve regurgitation is  not visualized.   6. The inferior vena cava is normal in size with greater than 50%  respiratory variability, suggesting right atrial pressure of 3 mmHg.     Neuro/Psych Seizures - (x1 in 1980s),  CVA (x2 08/2020, x1 02/2024, residual numbness of left hand and left side of face from 08/2020), Residual Symptoms    GI/Hepatic Neg liver ROS,GERD  ,,  Endo/Other  diabetes, Type 2    Renal/GU Renal disease     Musculoskeletal   Abdominal   Peds  Hematology negative hematology ROS (+) Lab Results      Component                Value               Date                      WBC                       6.4                 06/02/2024                HGB                      14.3                06/02/2024                HCT                      43.9                06/02/2024                MCV                      92                  06/02/2024                PLT  220                 06/02/2024              Anesthesia Other Findings Last Plavix : this morning  Last semaglutide : 06/06/2024  Last Jardiance : 06/10/2024    Reproductive/Obstetrics                              Anesthesia Physical Anesthesia Plan  ASA: 3  Anesthesia Plan: MAC   Post-op Pain Management: Minimal or no pain anticipated   Induction: Intravenous  PONV Risk Score and Plan: 1 and Propofol  infusion, TIVA and Treatment may vary due to age or medical condition  Airway Management Planned: Natural Airway and Nasal Cannula  Additional Equipment:   Intra-op Plan:   Post-operative Plan:   Informed Consent: I have reviewed the patients History and Physical, chart, labs and discussed the procedure including the risks, benefits and alternatives for the proposed anesthesia with the patient or authorized representative who has indicated his/her understanding and acceptance.     Dental advisory given  Plan Discussed with: Anesthesiologist and CRNA  Anesthesia Plan Comments: (Discussed with patient risks of MAC including, but not limited to, minor pain or discomfort, hearing people in the room, and possible need for backup general anesthesia. Risks for general anesthesia also discussed including, but not limited to, sore throat, hoarse voice, chipped/damaged teeth, injury to vocal cords, nausea and vomiting, allergic reactions, lung infection, heart attack, stroke, and death. All questions answered. )         Anesthesia Quick Evaluation

## 2024-06-15 NOTE — Interval H&P Note (Signed)
 History and Physical Interval Note:  06/15/2024 10:07 AM  Brendan [name]  has presented today for surgery, with the diagnosis of PFO.  The various methods of treatment have been discussed with the patient and family. After consideration of risks, benefits and other options for treatment, the patient has consented to  Procedure(s): TRANSESOPHAGEAL ECHOCARDIOGRAM (N/A) as a surgical intervention.  The patient's history has been reviewed, patient examined, no change in status, stable for surgery.  I have reviewed the patient's chart and labs.  Questions were answered to the patient's satisfaction.     Brendan [name]

## 2024-06-15 NOTE — Progress Notes (Signed)
  Echocardiogram Echocardiogram Transesophageal has been performed.  Koleen KANDICE Popper, RDCS 06/15/2024, 10:44 AM

## 2024-06-16 ENCOUNTER — Ambulatory Visit: Attending: Internal Medicine | Admitting: Internal Medicine

## 2024-06-16 ENCOUNTER — Encounter: Payer: Self-pay | Admitting: Internal Medicine

## 2024-06-16 VITALS — BP 108/56 | HR 68 | Ht 70.0 in | Wt 206.2 lb

## 2024-06-16 DIAGNOSIS — I700 Atherosclerosis of aorta: Secondary | ICD-10-CM | POA: Diagnosis not present

## 2024-06-16 DIAGNOSIS — E1122 Type 2 diabetes mellitus with diabetic chronic kidney disease: Secondary | ICD-10-CM

## 2024-06-16 DIAGNOSIS — E1159 Type 2 diabetes mellitus with other circulatory complications: Secondary | ICD-10-CM

## 2024-06-16 DIAGNOSIS — D869 Sarcoidosis, unspecified: Secondary | ICD-10-CM

## 2024-06-16 DIAGNOSIS — N183 Chronic kidney disease, stage 3 unspecified: Secondary | ICD-10-CM

## 2024-06-16 DIAGNOSIS — E1169 Type 2 diabetes mellitus with other specified complication: Secondary | ICD-10-CM

## 2024-06-16 DIAGNOSIS — I25118 Atherosclerotic heart disease of native coronary artery with other forms of angina pectoris: Secondary | ICD-10-CM

## 2024-06-16 DIAGNOSIS — I639 Cerebral infarction, unspecified: Secondary | ICD-10-CM | POA: Diagnosis not present

## 2024-06-16 DIAGNOSIS — E119 Type 2 diabetes mellitus without complications: Secondary | ICD-10-CM

## 2024-06-16 DIAGNOSIS — E785 Hyperlipidemia, unspecified: Secondary | ICD-10-CM

## 2024-06-16 DIAGNOSIS — I152 Hypertension secondary to endocrine disorders: Secondary | ICD-10-CM

## 2024-06-16 DIAGNOSIS — Z6829 Body mass index (BMI) 29.0-29.9, adult: Secondary | ICD-10-CM

## 2024-06-30 ENCOUNTER — Other Ambulatory Visit: Payer: Self-pay

## 2024-06-30 ENCOUNTER — Telehealth: Payer: Self-pay

## 2024-06-30 DIAGNOSIS — Q2112 Patent foramen ovale: Secondary | ICD-10-CM

## 2024-06-30 DIAGNOSIS — I639 Cerebral infarction, unspecified: Secondary | ICD-10-CM

## 2024-06-30 NOTE — Telephone Encounter (Signed)
 Called the patient to follow-up from his PFO consult and to see if he wishes to proceed with closure.  The patient has thought long and hard about the procedure and has decided to proceed on 08/05/2024. He is scheduled for pre-procedure visit with Izetta Hummer on 07/26/2024.  Will route to Dr. Wendel for antiplatelet instructions pre-procedure. The patient is currently taking Plavix  only.

## 2024-07-01 ENCOUNTER — Telehealth: Payer: Self-pay

## 2024-07-01 NOTE — Telephone Encounter (Signed)
 SABRA

## 2024-07-24 NOTE — Progress Notes (Unsigned)
 [facility]   MULTIDISCIPLINARY HEART VALVE CLINIC                                     Cardiology Office Note:    Date:  07/26/2024   ID:  Brendan [name], DOB 05/24/1961, MRN [id number]  PCP:  [name] LABOR, PA-C  [facility] Cardiologist:  Peter Swaziland, MD  [facility] Structural heart: Lurena MARLA Red, MD [facility] Electrophysiologist:  None   Referring MD: [name] LABOR, PA-C   Pre PFO closure visit - 08/05/24  History of Present Illness:    Brendan [name] is a 63 y.o. male with a hx of CAD, HTN, HLD, DMT2, CKD stage IIIa, pulmonary sarcoidosis, recurrent CVAs and PFO who presents to clinic to discuss upcoming PFO closure.   Although the patient has multiple other risk factors for stroke, all are appropriately managed and he has had recurrent CVAs.DVT study negative. Outpatient monitor negative for afib. TEE showed PFO with high risk anatomy with an atrial septum is aneurysmal more than 2 cm excursion. For this reason PFO closure has been recommended and set up for 08/05/24.  Today the patient presents to clinic for follow up. Here with his wife. No CP. Has chronic stable SOB related to pulmonary sarcoid which is in remission. No LE edema, orthopnea or PND. No dizziness or syncope. No blood in stool or urine. No palpitations. He does have a lot of environmental allergies and wonders what happens if he doesn't tolerate the device. He denies a nickel allergy.     Past Medical History:  Diagnosis Date   Coronary artery disease    Diabetes mellitus without complication (HCC)    Hyperlipidemia    Hypertension    Kidney disease    Sarcoidosis    Stroke [facility])      Current Medications: Current Meds  Medication Sig   albuterol (VENTOLIN HFA) 108 (90 Base) MCG/ACT inhaler Inhale 1-2 puffs into the lungs every 6 (six) hours as needed for shortness of breath or wheezing.   amLODipine (NORVASC) 10 MG tablet Take 10 mg by mouth daily.    amoxicillin  (AMOXIL ) 500 MG capsule TAKE FOUR CAPSULES BY MOUTH ONCE 1 HOUR PRIOR TO THE DENTAL TREATMENT   atorvastatin  (LIPITOR ) 80 MG tablet Take 80 mg by mouth at bedtime.   clobetasol cream (TEMOVATE) 0.05 % Apply 1 Application topically 3 (three) times a week.   clopidogrel  (PLAVIX ) 75 MG tablet Take 1 tablet (75 mg total) by mouth daily.   diclofenac Sodium (VOLTAREN) 1 % GEL Apply 2 g topically daily as needed (Shoulder paon).   donepezil (ARICEPT) 10 MG tablet Take 10 mg by mouth at bedtime.   DULoxetine  (CYMBALTA ) 30 MG capsule Take 30 mg by mouth daily.   empagliflozin  (JARDIANCE ) 25 MG TABS tablet Take 12.5 tablets by mouth daily.   EPINEPHrine  0.3 mg/0.3 mL IJ SOAJ injection Inject 0.3 mg into the muscle as needed for anaphylaxis.   escitalopram  (LEXAPRO ) 10 MG tablet Take 10 mg by mouth at bedtime.   ezetimibe  (ZETIA ) 10 MG tablet Take 10 mg by mouth daily.   mometasone -formoterol  (DULERA) 100-5 MCG/ACT AERO Inhale 2 puffs into the lungs in the morning and at bedtime.   pantoprazole  (PROTONIX ) 40 MG tablet Take 1 tablet (40 mg total) by mouth daily.   Semaglutide ,0.25 or 0.5MG /DOS, 2 MG/3ML SOPN Inject 0.5 mg into the skin  once a week. sunday   traZODone (DESYREL) 100 MG tablet Take 100 mg by mouth at bedtime.   Vitamin D , Ergocalciferol , (DRISDOL ) 1.25 MG (50000 UNIT) CAPS capsule Take 50,000 Units by mouth every 7 (seven) days. monday   zolpidem (AMBIEN) 10 MG tablet Take 10 mg by mouth at bedtime as needed for sleep.      ROS:   Please see the history of present illness.    All other systems reviewed and are negative.  EKGs   EKG Interpretation Date/Time:  Monday July 26 2024 10:21:27 EDT Ventricular Rate:  82 PR Interval:  168 QRS Duration:  104 QT Interval:  364 QTC Calculation: 425 R Axis:   -37  Text Interpretation: Normal sinus rhythm Left axis deviation Nonspecific T wave abnormality When compared with ECG of 23-Mar-2024 10:24, No significant change was  found Confirmed by Sebastian Collar [phone]) on 07/26/2024 10:34:01 AM   Risk Assessment/Calculations:            Physical Exam:    VS:  BP 114/70   Pulse 82   Ht 5' 10 (1.778 m)   Wt 212 lb 9.6 oz (96.4 kg)   SpO2 97%   BMI 30.50 kg/m     Wt Readings from Last 3 Encounters:  07/26/24 212 lb 9.6 oz (96.4 kg)  06/16/24 206 lb 3.2 oz (93.5 kg)  06/09/24 204 lb (92.5 kg)     GEN: Well nourished, well developed in no acute distress NECK: No JVD CARDIAC: RRR, no murmurs, rubs, gallops RESPIRATORY:  Clear to auscultation without rales, wheezing or rhonchi  ABDOMEN: Soft, non-tender, non-distended EXTREMITIES:  No edema; No deformity.    ASSESSMENT:    1. Patent foramen ovale   2. Recurrent strokes (HCC)   3. Recurrent cerebrovascular accidents (CVAs) (HCC)   4. Hypertension associated with diabetes (HCC)   5. Type 2 diabetes mellitus without complication, without long-term current use of insulin  (HCC)   6. CKD stage 3 due to type 2 diabetes mellitus (HCC)   7. Sarcoidosis   8. Coronary artery disease of native artery of native heart with stable angina pectoris     PLAN:    In order of problems listed above:  PFO with high risk anatomy and recurrent CVAs:  -- Set up for percutaneous PFO closure on 08/05/24 with Dr. Wendel. -- ECG/BMET/CBC today.  -- He does have a lot of environmental allergies and wonders what happens if he doesn't tolerate the device. He denies a nickel allergy. -- He understands the needs for SBE prophylaxis x 6 months after closure and has some periodontal work scheduled that he will push out 1 month.  -- Continue clopidogrel  75mg  daily.   Hx of recurrent CVAs: -- As above, set up for PFO closure.  -- Continue clopidogrel  75mg  daily.  -- Continue atorvastatin  80mg  daily.   HTN: -- BP well controlled today. -- Continue amlodipine 10 mg. -- Intolerant of ACE or ARB.  DMT2: -- Continue Jardiance  12.5mg  daily and Ozempic  (holding for PFO  closure). -- Intolerant of ACE or ARB.  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   CAD:  -- Cath 07/2022: 2 vessel obstructive CAD in two small diagonal vessels. Moderate disease in the mid LAD and mid LCx.  -- Continue medical therapy.  -- No chest pain.   Medication Adjustments/Labs and Tests Ordered: Current medicines are reviewed at length with the patient today.  Concerns regarding medicines  are outlined above.  Orders Placed This Encounter  Procedures   Basic Metabolic Panel (BMET)   CBC   EKG 12-Lead   No orders of the defined types were placed in this encounter.   Patient Instructions  Medication Instructions:  Your physician recommends that you continue on your current medications as directed. Please refer to the Current Medication list given to you today.  *If you need a refill on your cardiac medications before your next appointment, please call your pharmacy*  Lab Work: TODAY BMET and CBC If you have labs (blood work) drawn today and your tests are completely normal, you will receive your results only by: MyChart Message (if you have MyChart) OR A paper copy in the mail If you have any lab test that is abnormal or we need to change your treatment, we will call you to review the results.  Testing/Procedures: You are scheduled for a PFO Closure procedure on 08/05/24. Please see instruction letter attached.   Follow-Up: At [facility], you and your health needs are our priority.  As part of our continuing mission to provide you with exceptional heart care, our providers are all part of one team.  This team includes your primary Cardiologist (physician) and Advanced Practice Providers or APPs (Physician Assistants and Nurse Practitioners) who all work together to provide you with the care you need, when you need it.  Your next appointment:   As scheduled 09/13/24  Provider:   Izetta Hummer, PA-C     We recommend signing up for the patient portal called MyChart.  Sign up information is provided on this After Visit Summary.  MyChart is used to connect with patients for Virtual Visits (Telemedicine).  Patients are able to view lab/test results, encounter notes, upcoming appointments, etc.  Non-urgent messages can be sent to your provider as well.   To learn more about what you can do with MyChart, go to [email or web address].            Signed, Lamarr Hummer, PA-C  07/26/2024 10:57 AM     [facility]

## 2024-07-24 NOTE — H&P (View-Only) (Signed)
 [facility]ART [facility] VASCULAR CENTER   MULTIDISCIPLINARY HEART VALVE CLINIC                                     Cardiology Office Note:    Date:  07/26/2024   ID:  Brendan [name], DOB 05/24/1961, MRN [id number]  PCP:  Brendan Alisa LABOR, PA-C  [facility] Cardiologist:  Brendan Swaziland, MD  [facility] Structural heart: Brendan MARLA Red, MD [facility] Electrophysiologist:  None   Referring MD: Brendan Alisa LABOR, PA-C   Pre PFO closure visit - 08/05/24  History of Present Illness:    Brendan [name] is a 63 y.o. male with a hx of CAD, HTN, HLD, DMT2, CKD stage IIIa, pulmonary sarcoidosis, recurrent CVAs and PFO who presents to clinic to discuss upcoming PFO closure.   Although the patient has multiple other risk factors for stroke, all are appropriately managed and he has had recurrent CVAs.DVT study negative. Outpatient monitor negative for afib. TEE showed PFO with high risk anatomy with an atrial septum is aneurysmal more than 2 cm excursion. For this reason PFO closure has been recommended and set up for 08/05/24.  Today the patient presents to clinic for follow up. Here with his wife. No CP. Has chronic stable SOB related to pulmonary sarcoid which is in remission. No LE edema, orthopnea or PND. No dizziness or syncope. No blood in stool or urine. No palpitations. He does have a lot of environmental allergies and wonders what happens if he doesn't tolerate the device. He denies a nickel allergy.     Past Medical History:  Diagnosis Date   Coronary artery disease    Diabetes mellitus without complication (HCC)    Hyperlipidemia    Hypertension    Kidney disease    Sarcoidosis    Stroke [facility])      Current Medications: Current Meds  Medication Sig   albuterol (VENTOLIN HFA) 108 (90 Base) MCG/ACT inhaler Inhale 1-2 puffs into the lungs every 6 (six) hours as needed for shortness of breath or wheezing.   amLODipine (NORVASC) 10 MG tablet Take 10 mg by mouth daily.    amoxicillin  (AMOXIL ) 500 MG capsule TAKE FOUR CAPSULES BY MOUTH ONCE 1 HOUR PRIOR TO THE DENTAL TREATMENT   atorvastatin  (LIPITOR ) 80 MG tablet Take 80 mg by mouth at bedtime.   clobetasol cream (TEMOVATE) 0.05 % Apply 1 Application topically 3 (three) times a week.   clopidogrel  (PLAVIX ) 75 MG tablet Take 1 tablet (75 mg total) by mouth daily.   diclofenac Sodium (VOLTAREN) 1 % GEL Apply 2 g topically daily as needed (Shoulder paon).   donepezil (ARICEPT) 10 MG tablet Take 10 mg by mouth at bedtime.   DULoxetine  (CYMBALTA ) 30 MG capsule Take 30 mg by mouth daily.   empagliflozin  (JARDIANCE ) 25 MG TABS tablet Take 12.5 tablets by mouth daily.   EPINEPHrine  0.3 mg/0.3 mL IJ SOAJ injection Inject 0.3 mg into the muscle as needed for anaphylaxis.   escitalopram  (LEXAPRO ) 10 MG tablet Take 10 mg by mouth at bedtime.   ezetimibe  (ZETIA ) 10 MG tablet Take 10 mg by mouth daily.   mometasone -formoterol  (DULERA) 100-5 MCG/ACT AERO Inhale 2 puffs into the lungs in the morning and at bedtime.   pantoprazole  (PROTONIX ) 40 MG tablet Take 1 tablet (40 mg total) by mouth daily.   Semaglutide ,0.25 or 0.5MG /DOS, 2 MG/3ML SOPN Inject 0.5 mg into the skin  once a week. sunday   traZODone (DESYREL) 100 MG tablet Take 100 mg by mouth at bedtime.   Vitamin D , Ergocalciferol , (DRISDOL ) 1.25 MG (50000 UNIT) CAPS capsule Take 50,000 Units by mouth every 7 (seven) days. monday   zolpidem (AMBIEN) 10 MG tablet Take 10 mg by mouth at bedtime as needed for sleep.      ROS:   Please see the history of present illness.    All other systems reviewed and are negative.  EKGs   EKG Interpretation Date/Time:  Monday July 26 2024 10:21:27 EDT Ventricular Rate:  82 PR Interval:  168 QRS Duration:  104 QT Interval:  364 QTC Calculation: 425 R Axis:   -37  Text Interpretation: Normal sinus rhythm Left axis deviation Nonspecific T wave abnormality When compared with ECG of 23-Mar-2024 10:24, No significant change was  found Confirmed by Sebastian Collar [phone]) on 07/26/2024 10:34:01 AM   Risk Assessment/Calculations:            Physical Exam:    VS:  BP 114/70   Pulse 82   Ht 5' 10 (1.778 m)   Wt 212 lb 9.6 oz (96.4 kg)   SpO2 97%   BMI 30.50 kg/m     Wt Readings from Last 3 Encounters:  07/26/24 212 lb 9.6 oz (96.4 kg)  06/16/24 206 lb 3.2 oz (93.5 kg)  06/09/24 204 lb (92.5 kg)     GEN: Well nourished, well developed in no acute distress NECK: No JVD CARDIAC: RRR, no murmurs, rubs, gallops RESPIRATORY:  Clear to auscultation without rales, wheezing or rhonchi  ABDOMEN: Soft, non-tender, non-distended EXTREMITIES:  No edema; No deformity.    ASSESSMENT:    1. Patent foramen ovale   2. Recurrent strokes (HCC)   3. Recurrent cerebrovascular accidents (CVAs) (HCC)   4. Hypertension associated with diabetes (HCC)   5. Type 2 diabetes mellitus without complication, without long-term current use of insulin  (HCC)   6. CKD stage 3 due to type 2 diabetes mellitus (HCC)   7. Sarcoidosis   8. Coronary artery disease of native artery of native heart with stable angina pectoris     PLAN:    In order of problems listed above:  PFO with high risk anatomy and recurrent CVAs:  -- Set up for percutaneous PFO closure on 08/05/24 with Dr. Wendel. -- ECG/BMET/CBC today.  -- He does have a lot of environmental allergies and wonders what happens if he doesn't tolerate the device. He denies a nickel allergy. -- He understands the needs for SBE prophylaxis x 6 months after closure and has some periodontal work scheduled that he will push out 1 month.  -- Continue clopidogrel  75mg  daily.   Hx of recurrent CVAs: -- As above, set up for PFO closure.  -- Continue clopidogrel  75mg  daily.  -- Continue atorvastatin  80mg  daily.   HTN: -- BP well controlled today. -- Continue amlodipine 10 mg. -- Intolerant of ACE or ARB.  DMT2: -- Continue Jardiance  12.5mg  daily and Ozempic  (holding for PFO  closure). -- Intolerant of ACE or ARB.  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   CAD:  -- Cath 07/2022: 2 vessel obstructive CAD in two small diagonal vessels. Moderate disease in the mid LAD and mid LCx.  -- Continue medical therapy.  -- No chest pain.   Medication Adjustments/Labs and Tests Ordered: Current medicines are reviewed at length with the patient today.  Concerns regarding medicines  are outlined above.  Orders Placed This Encounter  Procedures   Basic Metabolic Panel (BMET)   CBC   EKG 12-Lead   No orders of the defined types were placed in this encounter.   Patient Instructions  Medication Instructions:  Your physician recommends that you continue on your current medications as directed. Please refer to the Current Medication list given to you today.  *If you need a refill on your cardiac medications before your next appointment, please call your pharmacy*  Lab Work: TODAY BMET and CBC If you have labs (blood work) drawn today and your tests are completely normal, you will receive your results only by: MyChart Message (if you have MyChart) OR A paper copy in the mail If you have any lab test that is abnormal or we need to change your treatment, we will call you to review the results.  Testing/Procedures: You are scheduled for a PFO Closure procedure on 08/05/24. Please see instruction letter attached.   Follow-Up: At [facility], you and your health needs are our priority.  As part of our continuing mission to provide you with exceptional heart care, our providers are all part of one team.  This team includes your primary Cardiologist (physician) and Advanced Practice Providers or APPs (Physician Assistants and Nurse Practitioners) who all work together to provide you with the care you need, when you need it.  Your next appointment:   As scheduled 09/13/24  Provider:   Izetta Hummer, PA-C     We recommend signing up for the patient portal called MyChart.  Sign up information is provided on this After Visit Summary.  MyChart is used to connect with patients for Virtual Visits (Telemedicine).  Patients are able to view lab/test results, encounter notes, upcoming appointments, etc.  Non-urgent messages can be sent to your provider as well.   To learn more about what you can do with MyChart, go to [email or web address].            Signed, Lamarr Hummer, PA-C  07/26/2024 10:57 AM     [facility]

## 2024-07-26 ENCOUNTER — Telehealth: Payer: Self-pay | Admitting: Internal Medicine

## 2024-07-26 ENCOUNTER — Ambulatory Visit: Attending: Physician Assistant | Admitting: Physician Assistant

## 2024-07-26 VITALS — BP 114/70 | HR 82 | Ht 70.0 in | Wt 212.6 lb

## 2024-07-26 DIAGNOSIS — I25118 Atherosclerotic heart disease of native coronary artery with other forms of angina pectoris: Secondary | ICD-10-CM | POA: Diagnosis present

## 2024-07-26 DIAGNOSIS — E119 Type 2 diabetes mellitus without complications: Secondary | ICD-10-CM | POA: Diagnosis present

## 2024-07-26 DIAGNOSIS — D869 Sarcoidosis, unspecified: Secondary | ICD-10-CM | POA: Insufficient documentation

## 2024-07-26 DIAGNOSIS — Q2112 Patent foramen ovale: Secondary | ICD-10-CM | POA: Diagnosis not present

## 2024-07-26 DIAGNOSIS — N183 Chronic kidney disease, stage 3 unspecified: Secondary | ICD-10-CM | POA: Diagnosis present

## 2024-07-26 DIAGNOSIS — I639 Cerebral infarction, unspecified: Secondary | ICD-10-CM | POA: Insufficient documentation

## 2024-07-26 DIAGNOSIS — E1122 Type 2 diabetes mellitus with diabetic chronic kidney disease: Secondary | ICD-10-CM | POA: Insufficient documentation

## 2024-07-26 DIAGNOSIS — E1159 Type 2 diabetes mellitus with other circulatory complications: Secondary | ICD-10-CM | POA: Insufficient documentation

## 2024-07-26 DIAGNOSIS — I152 Hypertension secondary to endocrine disorders: Secondary | ICD-10-CM | POA: Diagnosis present

## 2024-07-26 NOTE — Patient Instructions (Signed)
 Medication Instructions:  Your physician recommends that you continue on your current medications as directed. Please refer to the Current Medication list given to you today.  *If you need a refill on your cardiac medications before your next appointment, please call your pharmacy*  Lab Work: TODAY BMET and CBC If you have labs (blood work) drawn today and your tests are completely normal, you will receive your results only by: MyChart Message (if you have MyChart) OR A paper copy in the mail If you have any lab test that is abnormal or we need to change your treatment, we will call you to review the results.  Testing/Procedures: You are scheduled for a PFO Closure procedure on 08/05/24. Please see instruction letter attached.   Follow-Up: At [facility], you and your health needs are our priority.  As part of our continuing mission to provide you with exceptional heart care, our providers are all part of one team.  This team includes your primary Cardiologist (physician) and Advanced Practice Providers or APPs (Physician Assistants and Nurse Practitioners) who all work together to provide you with the care you need, when you need it.  Your next appointment:   As scheduled 09/13/24  Provider:   Izetta Hummer, PA-C    We recommend signing up for the patient portal called MyChart.  Sign up information is provided on this After Visit Summary.  MyChart is used to connect with patients for Virtual Visits (Telemedicine).  Patients are able to view lab/test results, encounter notes, upcoming appointments, etc.  Non-urgent messages can be sent to your provider as well.   To learn more about what you can do with MyChart, go to [email or web address].

## 2024-07-26 NOTE — Telephone Encounter (Signed)
 Pt requesting office notes and a treatment plan to the [facility] office. Pt doesn't have any contact information for the office.

## 2024-07-27 ENCOUNTER — Ambulatory Visit: Payer: Self-pay | Admitting: Physician Assistant

## 2024-07-27 LAB — CBC
Hematocrit: 42.4 % (ref 37.5–51.0)
Hemoglobin: 13.8 g/dL (ref 13.0–17.7)
MCH: 29.9 pg (ref 26.6–33.0)
MCHC: 32.5 g/dL (ref 31.5–35.7)
MCV: 92 fL (ref 79–97)
Platelets: 192 x10E3/uL (ref 150–450)
RBC: 4.61 x10E6/uL (ref 4.14–5.80)
RDW: 13.1 % (ref 11.6–15.4)
WBC: 5.6 x10E3/uL (ref 3.4–10.8)

## 2024-07-27 LAB — BASIC METABOLIC PANEL WITH GFR
BUN/Creatinine Ratio: 8 — AB (ref 10–24)
BUN: 13 mg/dL (ref 8–27)
CO2: 22 mmol/L (ref 20–29)
Calcium: 10.1 mg/dL (ref 8.6–10.2)
Chloride: 102 mmol/L (ref 96–106)
Creatinine, Ser: 1.55 mg/dL — AB (ref 0.76–1.27)
Glucose: 135 mg/dL — AB (ref 70–99)
Potassium: 4.3 mmol/L (ref 3.5–5.2)
Sodium: 140 mmol/L (ref 134–144)
eGFR: 50 mL/min/1.73 — AB (ref >59)

## 2024-07-29 ENCOUNTER — Ambulatory Visit: Admitting: Podiatry

## 2024-08-03 ENCOUNTER — Telehealth: Payer: Self-pay | Admitting: *Deleted

## 2024-08-03 NOTE — Telephone Encounter (Signed)
 PFO closure scheduled at [facility] for:  Thursday August 05, 2024 7:30 AM Arrival time [facility] Main Entrance A at: 5:30 AM  Diet: -Nothing to eat after midnight.   Hydration: -May drink clear liquids until 2 hours before the procedure.  Approved liquids: Water, clear tea, black coffee, fruit juices-non-citric and without pulp,Gatorade, plain Jello/popsicles.   -Please drink 16 oz of water 2 hours before procedure.   Medication instructions: 1) Skip the Ozempic  injection prior to the procedure (do not administer it within 7 days before your closure). You may resume normal dosing after the procedure. 2) Take Jardiance  Sunday October 5th then STOP. You may resume normal dosing after the procedure.  3) Otherwise, all other meds may be taken through 10/8 (including Plavix ). Patient knows to hold Plavix  the morning of the procedure.  4) The morning of your procedure, ONLY TAKE amlodipine                 Pt usually takes amlodipine in the evening-he will take usual dose evening prior to procedure.                    Plan to go home the same day, you will only stay overnight if medically necessary.  You must have responsible adult to drive you home.  Someone must be with you the first 24 hours after you arrive home.  Reviewed procedure instructions with patient.

## 2024-08-05 ENCOUNTER — Ambulatory Visit (HOSPITAL_COMMUNITY)
Admission: RE | Admit: 2024-08-05 | Discharge: 2024-08-05 | Disposition: A | Attending: Internal Medicine | Admitting: Internal Medicine

## 2024-08-05 ENCOUNTER — Other Ambulatory Visit (HOSPITAL_COMMUNITY): Payer: Self-pay

## 2024-08-05 ENCOUNTER — Ambulatory Visit (HOSPITAL_COMMUNITY)

## 2024-08-05 ENCOUNTER — Ambulatory Visit (HOSPITAL_COMMUNITY)
Admission: RE | Disposition: A | Discharge status: Home / Self Care | Payer: Self-pay | Source: Home / Self Care | Attending: Internal Medicine

## 2024-08-05 ENCOUNTER — Telehealth: Payer: Self-pay | Admitting: Pharmacy Technician

## 2024-08-05 ENCOUNTER — Other Ambulatory Visit: Payer: Self-pay

## 2024-08-05 DIAGNOSIS — I129 Hypertensive chronic kidney disease with stage 1 through stage 4 chronic kidney disease, or unspecified chronic kidney disease: Secondary | ICD-10-CM | POA: Diagnosis not present

## 2024-08-05 DIAGNOSIS — Z79899 Other long term (current) drug therapy: Secondary | ICD-10-CM | POA: Diagnosis not present

## 2024-08-05 DIAGNOSIS — E1122 Type 2 diabetes mellitus with diabetic chronic kidney disease: Secondary | ICD-10-CM | POA: Insufficient documentation

## 2024-08-05 DIAGNOSIS — Z7902 Long term (current) use of antithrombotics/antiplatelets: Secondary | ICD-10-CM | POA: Insufficient documentation

## 2024-08-05 DIAGNOSIS — I25118 Atherosclerotic heart disease of native coronary artery with other forms of angina pectoris: Secondary | ICD-10-CM | POA: Diagnosis not present

## 2024-08-05 DIAGNOSIS — Q2112 Patent foramen ovale: Secondary | ICD-10-CM | POA: Diagnosis not present

## 2024-08-05 DIAGNOSIS — E785 Hyperlipidemia, unspecified: Secondary | ICD-10-CM | POA: Insufficient documentation

## 2024-08-05 DIAGNOSIS — D860 Sarcoidosis of lung: Secondary | ICD-10-CM | POA: Insufficient documentation

## 2024-08-05 DIAGNOSIS — Z8673 Personal history of transient ischemic attack (TIA), and cerebral infarction without residual deficits: Secondary | ICD-10-CM | POA: Insufficient documentation

## 2024-08-05 DIAGNOSIS — N1831 Chronic kidney disease, stage 3a: Secondary | ICD-10-CM | POA: Diagnosis not present

## 2024-08-05 DIAGNOSIS — Z7985 Long-term (current) use of injectable non-insulin antidiabetic drugs: Secondary | ICD-10-CM | POA: Insufficient documentation

## 2024-08-05 DIAGNOSIS — Z7984 Long term (current) use of oral hypoglycemic drugs: Secondary | ICD-10-CM | POA: Insufficient documentation

## 2024-08-05 DIAGNOSIS — I639 Cerebral infarction, unspecified: Secondary | ICD-10-CM

## 2024-08-05 HISTORY — PX: PATENT FORAMEN OVALE(PFO) CLOSURE: CATH118300

## 2024-08-05 LAB — ECHOCARDIOGRAM LIMITED
Height: 70 in
S' Lateral: 3.3 cm
Weight: 3344 [oz_av]

## 2024-08-05 LAB — GLUCOSE, CAPILLARY: Glucose-Capillary: 162 mg/dL — ABNORMAL HIGH (ref 70–99)

## 2024-08-05 LAB — POCT ACTIVATED CLOTTING TIME: Activated Clotting Time: 250 s

## 2024-08-05 SURGERY — PATENT FORAMEN OVALE (PFO) CLOSURE
Anesthesia: LOCAL

## 2024-08-05 MED ORDER — IOHEXOL 350 MG/ML SOLN
INTRAVENOUS | Status: DC | PRN
  Administered 2024-08-05: 5 mL

## 2024-08-05 MED ORDER — LIDOCAINE HCL (PF) 1 % IJ SOLN
INTRAMUSCULAR | Status: DC | PRN
  Administered 2024-08-05: 5 mL via INTRADERMAL

## 2024-08-05 MED ORDER — ASPIRIN 81 MG PO CHEW
81.0000 mg | CHEWABLE_TABLET | Freq: Every day | ORAL | Status: DC
  Administered 2024-08-05: 81 mg via ORAL
  Filled 2024-08-05: qty 1

## 2024-08-05 MED ORDER — FENTANYL CITRATE (PF) 100 MCG/2ML IJ SOLN
INTRAMUSCULAR | Status: AC
  Filled 2024-08-05: qty 2

## 2024-08-05 MED ORDER — SODIUM CHLORIDE 0.9% FLUSH: 3.0000 mL | Freq: Two times a day (BID) | INTRAVENOUS | Status: DC

## 2024-08-05 MED ORDER — ASPIRIN 81 MG PO TBEC: 81.0000 mg | DELAYED_RELEASE_TABLET | Freq: Every day | ORAL | 2 refills | Status: AC

## 2024-08-05 MED ORDER — MIDAZOLAM HCL 2 MG/2ML IJ SOLN
INTRAMUSCULAR | Status: AC
  Filled 2024-08-05: qty 2

## 2024-08-05 MED ORDER — EMPAGLIFLOZIN 25 MG PO TABS: 25.0000 mg | ORAL_TABLET | Freq: Every day | ORAL | 10 refills | Status: AC

## 2024-08-05 MED ORDER — SODIUM CHLORIDE 0.9 % IV SOLN: 250.0000 mL | INTRAVENOUS | Status: DC | PRN

## 2024-08-05 MED ORDER — LIDOCAINE HCL (PF) 1 % IJ SOLN
INTRAMUSCULAR | Status: AC
  Filled 2024-08-05: qty 30

## 2024-08-05 MED ORDER — HEPARIN SODIUM (PORCINE) 1000 UNIT/ML IJ SOLN
INTRAMUSCULAR | Status: AC
  Filled 2024-08-05: qty 10

## 2024-08-05 MED ORDER — LABETALOL HCL 5 MG/ML IV SOLN: 10.0000 mg | INTRAVENOUS | Status: AC | PRN

## 2024-08-05 MED ORDER — HEPARIN (PORCINE) IN NACL 1000-0.9 UT/500ML-% IV SOLN
INTRAVENOUS | Status: DC | PRN
  Administered 2024-08-05: 1000 mL

## 2024-08-05 MED ORDER — ONDANSETRON HCL 4 MG/2ML IJ SOLN: 4.0000 mg | Freq: Four times a day (QID) | INTRAMUSCULAR | Status: DC | PRN

## 2024-08-05 MED ORDER — FENTANYL CITRATE (PF) 100 MCG/2ML IJ SOLN
INTRAMUSCULAR | Status: DC | PRN
  Administered 2024-08-05: 50 ug via INTRAVENOUS

## 2024-08-05 MED ORDER — FREE WATER: 500.0000 mL | Freq: Once | Status: DC

## 2024-08-05 MED ORDER — ACETAMINOPHEN 325 MG PO TABS: 650.0000 mg | ORAL_TABLET | ORAL | Status: DC | PRN

## 2024-08-05 MED ORDER — SODIUM CHLORIDE 0.9% FLUSH: 3.0000 mL | INTRAVENOUS | Status: DC | PRN

## 2024-08-05 MED ORDER — MIDAZOLAM HCL 2 MG/2ML IJ SOLN
INTRAMUSCULAR | Status: DC | PRN
  Administered 2024-08-05: 1 mg via INTRAVENOUS

## 2024-08-05 MED ORDER — CEFAZOLIN SODIUM-DEXTROSE 2-3 GM-%(50ML) IV SOLR
INTRAVENOUS | Status: DC | PRN
Start: 2024-08-05 — End: 2024-08-05
  Administered 2024-08-05: 2 g via INTRAVENOUS

## 2024-08-05 MED ORDER — HEPARIN SODIUM (PORCINE) 1000 UNIT/ML IJ SOLN
INTRAMUSCULAR | Status: DC | PRN
  Administered 2024-08-05: 11000 [IU] via INTRAVENOUS
  Administered 2024-08-05: 1000 [IU] via INTRAVENOUS

## 2024-08-05 MED ORDER — CEFAZOLIN SODIUM-DEXTROSE 2-4 GM/100ML-% IV SOLN
INTRAVENOUS | Status: AC
  Filled 2024-08-05: qty 100

## 2024-08-05 MED ORDER — HYDRALAZINE HCL 20 MG/ML IJ SOLN: 10.0000 mg | INTRAMUSCULAR | Status: AC | PRN

## 2024-08-05 SURGICAL SUPPLY — 17 items
CATH ACUNAV GE 8F-90 (CATHETERS) IMPLANT
CATH EXPO 5F MPA-1 (CATHETERS) IMPLANT
CLOSURE PERCLOSE PROSTYLE (Vascular Products) IMPLANT
COVER PRB 48X5XTLSCP FOLD TPE (BAG) IMPLANT
COVER SWIFTLINK CONNECTOR (BAG) IMPLANT
GUIDEWIRE SAFE TJ AMPLATZ EXST (WIRE) IMPLANT
KIT HEART LEFT (KITS) ×1 IMPLANT
KIT SYRINGE INJ CVI SPIKEX1 (MISCELLANEOUS) IMPLANT
OCCLUDER CARDIOFORM SEPTAL 30 (Prosthesis & Implant Heart) IMPLANT
PACK CARDIAC CATHETERIZATION (CUSTOM PROCEDURE TRAY) ×1 IMPLANT
SHEATH FAST CATH 12F 12CM (SHEATH) IMPLANT
SHEATH INTROD W/O MIN 9FR 25CM (SHEATH) IMPLANT
SHEATH PINNACLE 8F 10CM (SHEATH) IMPLANT
TRANSDUCER W/STOPCOCK (MISCELLANEOUS) ×1 IMPLANT
TUBING CIL FLEX 10 FLL-RA (TUBING) ×1 IMPLANT
WIRE EMERALD 3MM-J .035X260CM (WIRE) IMPLANT
WIRE MICRO SET SILHO 5FR 7 (SHEATH) IMPLANT

## 2024-08-05 NOTE — Progress Notes (Signed)
 Patient ambulated to the bathroom and voided. No hematoma or bleeding noted to the site. Dressing clean, dry, and intact.

## 2024-08-05 NOTE — Progress Notes (Signed)
 Oozing noted right groin and pressure held x ; Dr Wendel notified and pressure dressing right groin and Dr Wendel will be in to see client

## 2024-08-05 NOTE — Telephone Encounter (Signed)
 Pharmacy Patient Advocate Encounter  Received notification from [facility] that Prior Authorization for jardiance  has been APPROVED from 08/05/24 to 08/05/25. Ran test claim, Copay is $39.53- one month. This test claim was processed through [facility]- copay amounts may vary at other pharmacies due to pharmacy/plan contracts, or as the patient moves through the different stages of their insurance plan.   PA #/Case ID/Reference #: [id number]

## 2024-08-05 NOTE — Interval H&P Note (Signed)
 History and Physical Interval Note:  08/05/2024 6:28 AM  Brendan [name]  has presented today for surgery, with the diagnosis of pfo.  The various methods of treatment have been discussed with the patient and family. After consideration of risks, benefits and other options for treatment, the patient has consented to  Procedure(s): PATENT FORAMEN OVALE(PFO) CLOSURE (N/A) as a surgical intervention.  The patient's history has been reviewed, patient examined, no change in status, stable for surgery.  I have reviewed the patient's chart and labs.  Questions were answered to the patient's satisfaction.     [name]

## 2024-08-05 NOTE — Telephone Encounter (Signed)
 Pharmacy Patient Advocate Encounter   Received notification from CoverMyMeds that prior authorization for Jardiance  25MG   is required/requested.   Insurance verification completed.   The patient is insured through [employer].   Per test claim: PA required; PA submitted to above mentioned insurance via Latent Key/confirmation #/EOC [id number] Status is pending

## 2024-08-05 NOTE — Progress Notes (Signed)
  HEART AND VASCULAR CENTER   MULTIDISCIPLINARY HEART VALVE TEAM  Called to bedside for persistent groin oozing. Treated with 7 ccs of lido/epi and topical silver nitrate with good hemostasis. 10 minutes of additional pressure held and redressed. 2 hours of bedrest before ambulation.   Lamarr Hummer PA-C  [facility]  Pager [phone]

## 2024-08-05 NOTE — Discharge Instructions (Addendum)
 Start aspirin  81mg , take indefinitely  Take clopidogrel  75mg  for 6 months  You will require antibiotics prior to any dental work, including cleanings, for 6 months after your PFO/ASD closure. This is to protect the device from potentially getting infected from bacteria in your mouth entering your bloodstream. The medication will be called into your pharmacy at your 1 month follow up appointment. If you require dental work before that time, please call the office and it will be called into your pharmacy on file. Please pick this up to have ready before any scheduled dental work. Instructions will be outlined on the bottle.   Groin Site Care Refer to this sheet in the next few weeks. These instructions provide you with information on caring for yourself after your procedure. Your caregiver may also give you more specific instructions. Your treatment has been planned according to current medical practices, but problems sometimes occur. Call your caregiver if you have any problems or questions after your procedure. HOME CARE INSTRUCTIONS You may shower 24 hours after the procedure. Remove the bandage (dressing) and gently wash the site with plain soap and water. Gently pat the site dry.  Do not apply powder or lotion to the site.  Do not sit in a bathtub, swimming pool, or whirlpool for 5 to 7 days.  No bending, squatting, or lifting anything over 10 pounds (4.5 kg) for 1 week Inspect the site at least twice daily.  Do not drive home if you are discharged the same day of the procedure. Have someone else drive you.  You may drive 24 hours after the procedure unless otherwise instructed by your caregiver.  What to expect: Any bruising will usually fade within 1 to 2 weeks.  Blood that collects in the tissue (hematoma) may be painful to the touch. It should usually decrease in size and tenderness within 1 to 2 weeks.  SEEK IMMEDIATE MEDICAL CARE IF: You have unusual pain at the groin site or down the  affected leg.  You have redness, warmth, swelling, or pain at the groin site.  You have drainage (other than a small amount of blood on the dressing).  You have chills.  You have a fever or persistent symptoms for more than 72 hours.  You have a fever and your symptoms suddenly get worse.  Your leg becomes pale, cool, tingly, or numb.  You have heavy bleeding from the site. Hold pressure on the site.

## 2024-08-06 ENCOUNTER — Encounter (HOSPITAL_COMMUNITY): Payer: Self-pay | Admitting: Internal Medicine

## 2024-08-06 MED FILL — Cefazolin Sodium-Dextrose IV Solution 2 GM/100ML-4%: INTRAVENOUS | Qty: 100 | Status: AC

## 2024-08-10 LAB — POCT ACTIVATED CLOTTING TIME: Activated Clotting Time: 0 s

## 2024-08-16 ENCOUNTER — Telehealth (HOSPITAL_BASED_OUTPATIENT_CLINIC_OR_DEPARTMENT_OTHER): Payer: Self-pay | Admitting: *Deleted

## 2024-08-16 NOTE — Telephone Encounter (Signed)
   Pre-operative Risk Assessment    Patient Name: Brendan [name]  DOB: [birth year] MRN: [id number]   Date of last office visit: 07/26/24 KATHRYN THOMPSON, [facility] Date of next office visit: 09/13/24 LAMARR HUMMER, [facility]  Request for Surgical Clearance    Procedure:  PERIO ROOT PLANING AND SCALING IN ALL FOUR QUADRANTS  Date of Surgery:  Clearance 08/30/24                                Surgeon: NOT LISTED Surgeon's Group or Practice Name:  [facility] Phone number:  [phone] Fax number:  [phone]   Type of Clearance Requested:   - Medical  - Pharmacy:  Hold Aspirin  and Clopidogrel  (Plavix )     Type of Anesthesia:  Local  w/EPI   Additional requests/questions:    Bonney Niels Jest   08/16/2024, 1:10 PM

## 2024-08-18 NOTE — Telephone Encounter (Signed)
   Name: ZEPHANIAH LUBRANO  DOB: 1961/04/09  MRN: [id number]  Primary Cardiologist: Peter Swaziland, MD  Chart reviewed as part of pre-operative protocol coverage. The patient has an upcoming visit scheduled with  Lamarr Hummer on 09/13/2024 at which time clearance can be addressed in case there are any issues that would impact surgical recommendations. He us  s/p PFO closure.   PERIO ROOT PLANING AND SCALING IN ALL FOUR QUADRANTS is scheduled for 08/30/2024 and will need to be postponed until seen.  I added preop FYI to appointment note so that provider is aware to address at time of outpatient visit.  Per office protocol the cardiology provider should forward their finalized clearance decision and recommendations regarding antiplatelet therapy to the requesting party below.    I will route this message as FYI to requesting party and remove this message from the preop box as separate preop APP input not needed at this time.   Please call with any questions.  Lamarr Satterfield, NP  08/18/2024, 8:16 AM

## 2024-08-18 NOTE — Telephone Encounter (Signed)
 I will update the requesting office to see the notes from the preop APP Lamarr CROME. DNP.

## 2024-08-18 NOTE — Telephone Encounter (Signed)
   Name:  QUIN MATHENIA  DOB:  1961/02/27  MRN:  [id number]   Primary Cardiologist: Peter Swaziland, MD  Chart reviewed as part of pre-operative protocol coverage. Patient  Primary Cardiologist: Peter Swaziland, MD  Chart reviewed as part of pre-operative protocol coverage. The patient has an upcoming visit scheduled with  Lamarr Hummer on 09/13/2024 at which time clearance can be addressed in case there are any issues that would impact surgical recommendations. He us  s/p PFO closure.   PERIO ROOT PLANING AND SCALING IN ALL FOUR QUADRANTS is scheduled for 08/30/2024 and will need to be postponed until seen.  I added preop FYI to appointment note so that provider is aware to address at time of outpatient visit.  Per office protocol the cardiology provider should forward their finalized clearance decision and recommendations regarding antiplatelet therapy to the requesting party below.    I will route this message as FYI to requesting party and remove this message from the preop box as separate preop APP input not needed at this time.   Please call with any questions. Due to new or worsening symptoms, BURNHAM TROST will require a follow-up visit for further pre-operative risk assessment.    Lamarr Satterfield, NP 08/18/2024, 8:20 AM

## 2024-08-25 ENCOUNTER — Telehealth: Payer: Self-pay | Admitting: Cardiology

## 2024-08-25 MED ORDER — AMLODIPINE BESYLATE 10 MG PO TABS: 10.0000 mg | ORAL_TABLET | Freq: Every day | ORAL | 1 refills | Status: DC

## 2024-08-25 NOTE — Telephone Encounter (Signed)
*  STAT* If patient is at the pharmacy, call can be transferred to refill team.   1. Which medications need to be refilled? (please list name of each medication and dose if known) amLODipine (NORVASC) 10 MG tablet    2. Would you like to learn more about the convenience, safety, & potential cost savings by using the [facility]? no   3. Are you open to using the [facility] (Type [facility]. ).no   4. Which pharmacy/location (including street and city if local pharmacy) is medication to be sent to?[facility] - [place], [place] - [address]    5. Do they need a 30 day or 90 day supply? 30 day    Pt is out of medication

## 2024-08-30 ENCOUNTER — Inpatient Hospital Stay (HOSPITAL_COMMUNITY)
Admission: EM | Admit: 2024-08-30 | Discharge: 2024-09-03 | DRG: 478 | Disposition: A | Discharge status: Home Health | Attending: Hospitalist | Admitting: Hospitalist

## 2024-08-30 ENCOUNTER — Emergency Department (HOSPITAL_COMMUNITY)

## 2024-08-30 ENCOUNTER — Observation Stay (HOSPITAL_COMMUNITY)

## 2024-08-30 ENCOUNTER — Encounter (HOSPITAL_COMMUNITY): Payer: Self-pay

## 2024-08-30 ENCOUNTER — Other Ambulatory Visit: Payer: Self-pay

## 2024-08-30 DIAGNOSIS — Z8673 Personal history of transient ischemic attack (TIA), and cerebral infarction without residual deficits: Secondary | ICD-10-CM

## 2024-08-30 DIAGNOSIS — Z8249 Family history of ischemic heart disease and other diseases of the circulatory system: Secondary | ICD-10-CM

## 2024-08-30 DIAGNOSIS — M9711XA 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, initial encounter: Secondary | ICD-10-CM | POA: Diagnosis not present

## 2024-08-30 DIAGNOSIS — Z7902 Long term (current) use of antithrombotics/antiplatelets: Secondary | ICD-10-CM

## 2024-08-30 DIAGNOSIS — Z888 Allergy status to other drugs, medicaments and biological substances status: Secondary | ICD-10-CM

## 2024-08-30 DIAGNOSIS — Z8774 Personal history of (corrected) congenital malformations of heart and circulatory system: Secondary | ICD-10-CM | POA: Diagnosis not present

## 2024-08-30 DIAGNOSIS — Z96652 Presence of left artificial knee joint: Secondary | ICD-10-CM | POA: Diagnosis present

## 2024-08-30 DIAGNOSIS — Z794 Long term (current) use of insulin: Secondary | ICD-10-CM

## 2024-08-30 DIAGNOSIS — E785 Hyperlipidemia, unspecified: Secondary | ICD-10-CM | POA: Diagnosis present

## 2024-08-30 DIAGNOSIS — G4733 Obstructive sleep apnea (adult) (pediatric): Secondary | ICD-10-CM | POA: Diagnosis present

## 2024-08-30 DIAGNOSIS — I951 Orthostatic hypotension: Secondary | ICD-10-CM | POA: Diagnosis not present

## 2024-08-30 DIAGNOSIS — Z01810 Encounter for preprocedural cardiovascular examination: Secondary | ICD-10-CM

## 2024-08-30 DIAGNOSIS — M84451A Pathological fracture, right femur, initial encounter for fracture: Principal | ICD-10-CM | POA: Diagnosis present

## 2024-08-30 DIAGNOSIS — E559 Vitamin D deficiency, unspecified: Secondary | ICD-10-CM | POA: Diagnosis present

## 2024-08-30 DIAGNOSIS — R7989 Other specified abnormal findings of blood chemistry: Secondary | ICD-10-CM

## 2024-08-30 DIAGNOSIS — Z7951 Long term (current) use of inhaled steroids: Secondary | ICD-10-CM

## 2024-08-30 DIAGNOSIS — Z7982 Long term (current) use of aspirin: Secondary | ICD-10-CM

## 2024-08-30 DIAGNOSIS — M9702XA Periprosthetic fracture around internal prosthetic left hip joint, initial encounter: Secondary | ICD-10-CM | POA: Diagnosis present

## 2024-08-30 DIAGNOSIS — I129 Hypertensive chronic kidney disease with stage 1 through stage 4 chronic kidney disease, or unspecified chronic kidney disease: Secondary | ICD-10-CM | POA: Diagnosis present

## 2024-08-30 DIAGNOSIS — R509 Fever, unspecified: Secondary | ICD-10-CM | POA: Diagnosis not present

## 2024-08-30 DIAGNOSIS — Z7984 Long term (current) use of oral hypoglycemic drugs: Secondary | ICD-10-CM

## 2024-08-30 DIAGNOSIS — I2510 Atherosclerotic heart disease of native coronary artery without angina pectoris: Secondary | ICD-10-CM | POA: Diagnosis not present

## 2024-08-30 DIAGNOSIS — S72351A Displaced comminuted fracture of shaft of right femur, initial encounter for closed fracture: Principal | ICD-10-CM

## 2024-08-30 DIAGNOSIS — Z7985 Long-term (current) use of injectable non-insulin antidiabetic drugs: Secondary | ICD-10-CM

## 2024-08-30 DIAGNOSIS — N1831 Chronic kidney disease, stage 3a: Secondary | ICD-10-CM | POA: Diagnosis present

## 2024-08-30 DIAGNOSIS — Z79899 Other long term (current) drug therapy: Secondary | ICD-10-CM

## 2024-08-30 DIAGNOSIS — E1122 Type 2 diabetes mellitus with diabetic chronic kidney disease: Secondary | ICD-10-CM | POA: Diagnosis present

## 2024-08-30 DIAGNOSIS — S72451A Displaced supracondylar fracture without intracondylar extension of lower end of right femur, initial encounter for closed fracture: Secondary | ICD-10-CM | POA: Diagnosis present

## 2024-08-30 DIAGNOSIS — Z91040 Latex allergy status: Secondary | ICD-10-CM

## 2024-08-30 LAB — BASIC METABOLIC PANEL WITH GFR
Anion gap: 11 (ref 5–15)
BUN: 12 mg/dL (ref 8–23)
CO2: 25 mmol/L (ref 22–32)
Calcium: 9.6 mg/dL (ref 8.9–10.3)
Chloride: 104 mmol/L (ref 98–111)
Creatinine, Ser: 1.55 mg/dL — ABNORMAL HIGH (ref 0.61–1.24)
GFR, Estimated: 50 mL/min — ABNORMAL LOW (ref >60)
Glucose, Bld: 127 mg/dL — ABNORMAL HIGH (ref 70–99)
Potassium: 4 mmol/L (ref 3.5–5.1)
Sodium: 140 mmol/L (ref 135–145)

## 2024-08-30 LAB — CBC
HCT: 42.3 % (ref 39.0–52.0)
Hemoglobin: 13.8 g/dL (ref 13.0–17.0)
MCH: 29.9 pg (ref 26.0–34.0)
MCHC: 32.6 g/dL (ref 30.0–36.0)
MCV: 91.8 fL (ref 80.0–100.0)
Platelets: 188 K/uL (ref 150–400)
RBC: 4.61 MIL/uL (ref 4.22–5.81)
RDW: 13.5 % (ref 11.5–15.5)
WBC: 8.3 K/uL (ref 4.0–10.5)
nRBC: 0 % (ref 0.0–0.2)

## 2024-08-30 LAB — SURGICAL PCR SCREEN
MRSA, PCR: NEGATIVE
Staphylococcus aureus: NEGATIVE

## 2024-08-30 LAB — GLUCOSE, CAPILLARY: Glucose-Capillary: 170 mg/dL — ABNORMAL HIGH (ref 70–99)

## 2024-08-30 MED ORDER — CLOPIDOGREL BISULFATE 75 MG PO TABS: 75.0000 mg | ORAL_TABLET | Freq: Every day | ORAL | Status: DC

## 2024-08-30 MED ORDER — OXYCODONE HCL 5 MG PO TABS
5.0000 mg | ORAL_TABLET | ORAL | Status: DC | PRN
  Administered 2024-08-30 – 2024-08-31 (×2): 5 mg via ORAL
  Filled 2024-08-30 (×2): qty 1

## 2024-08-30 MED ORDER — ENOXAPARIN SODIUM 40 MG/0.4ML IJ SOSY: 40.0000 mg | PREFILLED_SYRINGE | INTRAMUSCULAR | Status: DC

## 2024-08-30 MED ORDER — ATORVASTATIN CALCIUM 80 MG PO TABS
80.0000 mg | ORAL_TABLET | Freq: Every day | ORAL | Status: DC
  Administered 2024-08-30 – 2024-09-02 (×4): 80 mg via ORAL
  Filled 2024-08-30 (×4): qty 1

## 2024-08-30 MED ORDER — PANTOPRAZOLE SODIUM 40 MG PO TBEC
40.0000 mg | DELAYED_RELEASE_TABLET | Freq: Every day | ORAL | Status: DC
  Administered 2024-09-01 – 2024-09-03 (×3): 40 mg via ORAL
  Filled 2024-08-30 (×4): qty 1

## 2024-08-30 MED ORDER — ASPIRIN 81 MG PO TBEC: 81.0000 mg | DELAYED_RELEASE_TABLET | Freq: Every day | ORAL | Status: DC

## 2024-08-30 MED ORDER — ASPIRIN 81 MG PO TBEC
81.0000 mg | DELAYED_RELEASE_TABLET | Freq: Every day | ORAL | Status: DC
Start: 2024-08-30 — End: 2024-08-30

## 2024-08-30 MED ORDER — ONDANSETRON HCL 4 MG PO TABS: 4.0000 mg | ORAL_TABLET | Freq: Four times a day (QID) | ORAL | Status: DC | PRN

## 2024-08-30 MED ORDER — CARVEDILOL 6.25 MG PO TABS
6.2500 mg | ORAL_TABLET | Freq: Two times a day (BID) | ORAL | Status: DC
  Administered 2024-08-30 – 2024-09-02 (×5): 6.25 mg via ORAL
  Filled 2024-08-30 (×2): qty 1
  Filled 2024-08-30: qty 2
  Filled 2024-08-30 (×4): qty 1

## 2024-08-30 MED ORDER — CEFAZOLIN SODIUM-DEXTROSE 2-4 GM/100ML-% IV SOLN
2.0000 g | INTRAVENOUS | Status: AC
  Administered 2024-08-31: 2 g via INTRAVENOUS
  Filled 2024-08-30: qty 100

## 2024-08-30 MED ORDER — ONDANSETRON HCL 4 MG/2ML IJ SOLN: 4.0000 mg | Freq: Four times a day (QID) | INTRAMUSCULAR | Status: DC | PRN

## 2024-08-30 MED ORDER — AMLODIPINE BESYLATE 10 MG PO TABS
10.0000 mg | ORAL_TABLET | Freq: Every day | ORAL | Status: DC
  Administered 2024-08-30: 10 mg via ORAL
  Filled 2024-08-30 (×2): qty 1

## 2024-08-30 MED ORDER — FLUTICASONE FUROATE-VILANTEROL 100-25 MCG/ACT IN AEPB
1.0000 | INHALATION_SPRAY | Freq: Every day | RESPIRATORY_TRACT | Status: DC
  Administered 2024-09-01 – 2024-09-03 (×3): 1 via RESPIRATORY_TRACT
  Filled 2024-08-30: qty 28

## 2024-08-30 MED ORDER — POVIDONE-IODINE 10 % EX SWAB
2.0000 | Freq: Once | CUTANEOUS | Status: AC
  Administered 2024-08-31: 2 via TOPICAL

## 2024-08-30 MED ORDER — INSULIN ASPART 100 UNIT/ML IJ SOLN
0.0000 [IU] | Freq: Three times a day (TID) | INTRAMUSCULAR | Status: DC
  Administered 2024-08-31 – 2024-09-02 (×3): 1 [IU] via SUBCUTANEOUS
  Administered 2024-09-02 – 2024-09-03 (×3): 2 [IU] via SUBCUTANEOUS
  Administered 2024-09-03: 1 [IU] via SUBCUTANEOUS
  Filled 2024-08-30: qty 1
  Filled 2024-08-30: qty 2
  Filled 2024-08-30 (×2): qty 1
  Filled 2024-08-30: qty 2
  Filled 2024-08-30: qty 1
  Filled 2024-08-30: qty 2

## 2024-08-30 MED ORDER — INSULIN ASPART 100 UNIT/ML IJ SOLN: 0.0000 [IU] | Freq: Every day | INTRAMUSCULAR | Status: DC

## 2024-08-30 MED ORDER — ACETAMINOPHEN 650 MG RE SUPP: 650.0000 mg | Freq: Four times a day (QID) | RECTAL | Status: DC | PRN

## 2024-08-30 MED ORDER — NITROGLYCERIN 0.4 MG SL SUBL: 0.4000 mg | SUBLINGUAL_TABLET | SUBLINGUAL | Status: DC | PRN

## 2024-08-30 MED ORDER — CHLORHEXIDINE GLUCONATE 4 % EX SOLN
60.0000 mL | Freq: Once | CUTANEOUS | Status: AC
Start: 2024-08-30 — End: 2024-08-31
  Administered 2024-08-31: 4 via TOPICAL

## 2024-08-30 MED ORDER — MORPHINE SULFATE (PF) 2 MG/ML IV SOLN: 2.0000 mg | INTRAVENOUS | Status: DC | PRN

## 2024-08-30 MED ORDER — SODIUM CHLORIDE 0.9% FLUSH
3.0000 mL | Freq: Two times a day (BID) | INTRAVENOUS | Status: DC
  Administered 2024-09-01 – 2024-09-03 (×5): 3 mL via INTRAVENOUS

## 2024-08-30 MED ORDER — ACETAMINOPHEN 325 MG PO TABS
650.0000 mg | ORAL_TABLET | Freq: Four times a day (QID) | ORAL | Status: DC | PRN
  Administered 2024-08-30: 650 mg via ORAL
  Filled 2024-08-30: qty 2

## 2024-08-30 NOTE — ED Notes (Signed)
 Ortho PA at [facility]

## 2024-08-30 NOTE — ED Provider Notes (Signed)
 [place] EMERGENCY DEPARTMENT AT [facility] Provider Note   CSN: [id number] Arrival date & time: 08/30/24  1245     Patient presents with: Knee Injury   Brendan [name] is a 63 y.o. male.   HPI 63 year old male hypertension, sarcoidosis, hyperlipidemia, CAD, stroke, kidney disease, diabetes, recent PFO closure, prior right knee replacement who presents to the emergency department with complaints of right knee pain.  He was walking down the stairs when he suddenly felt a pop and acute pain to the right knee.  He has not been able to bear weight since.  Denies any numbness or tingling.    Prior to Admission medications   Medication Sig Start Date End Date Taking? Authorizing Provider  albuterol (VENTOLIN HFA) 108 (90 Base) MCG/ACT inhaler Inhale 1-2 puffs into the lungs every 6 (six) hours as needed for shortness of breath or wheezing. 04/20/21   [provider]  amLODipine (NORVASC) 10 MG tablet Take 1 tablet (10 mg total) by mouth daily. 08/25/24   Jordan, Peter M, MD  amoxicillin  (AMOXIL ) 500 MG capsule TAKE FOUR CAPSULES BY MOUTH ONCE 1 HOUR PRIOR TO THE DENTAL TREATMENT 07/15/22   Jordan, Peter M, MD  aspirin  EC 81 MG tablet Take 1 tablet (81 mg total) by mouth daily. Swallow whole. 08/05/24 08/05/25  Thukkani, Arun K, MD  atorvastatin  (LIPITOR ) 80 MG tablet Take 80 mg by mouth at bedtime. 09/17/22 08/05/24  [provider]  clobetasol cream (TEMOVATE) 0.05 % Apply 1 Application topically daily as needed (Psoriasis). 07/29/22   [provider]  clopidogrel  (PLAVIX ) 75 MG tablet Take 1 tablet (75 mg total) by mouth daily. 03/31/24   Jordan, Peter M, MD  diclofenac Sodium (VOLTAREN) 1 % GEL Apply 2 g topically daily as needed (Shoulder paon). 06/27/23   [provider]  empagliflozin  (JARDIANCE ) 25 MG TABS tablet Take 1 tablet (25 mg total) by mouth daily. 08/05/24   Thukkani, Arun K, MD  EPINEPHrine  0.3 mg/0.3 mL IJ SOAJ injection Inject 0.3  mg into the muscle as needed for anaphylaxis. 01/31/22   Jarold Olam HERO, PA-C  escitalopram  (LEXAPRO ) 10 MG tablet Take 10 mg by mouth at bedtime. 10/05/21   [provider]  ezetimibe  (ZETIA ) 10 MG tablet Take 10 mg by mouth daily. 04/21/23 08/05/24  [provider]  mometasone -formoterol  (DULERA) 100-5 MCG/ACT AERO Inhale 2 puffs into the lungs in the morning and at bedtime. Patient taking differently: Inhale 2 puffs into the lungs 2 (two) times daily as needed (sarcoidosis). 12/27/22   Parrett, Madelin RAMAN, NP  pantoprazole  (PROTONIX ) 40 MG tablet Take 1 tablet (40 mg total) by mouth daily. 03/31/24   Jordan, Peter M, MD  Semaglutide ,0.25 or 0.5MG /DOS, 2 MG/3ML SOPN Inject 0.5 mg into the skin once a week. sunday 01/03/23   [provider]  traZODone (DESYREL) 100 MG tablet Take 100 mg by mouth at bedtime. 12/27/21   [provider]  Vitamin D , Ergocalciferol , (DRISDOL ) 1.25 MG (50000 UNIT) CAPS capsule Take 50,000 Units by mouth every 7 (seven) days. monday 07/03/23   [provider]  zolpidem (AMBIEN) 10 MG tablet Take 10 mg by mouth at bedtime as needed for sleep. 10/05/21 08/05/24  [provider]    Allergies: Latex, Lisinopril, Losartan, Metformin, and Sildenafil    Review of Systems  Updated Vital Signs BP 134/81 (BP Location: Right Arm)   Pulse 91   Temp 98.4 F (36.9 C)   Resp 18   Ht 5'  10 (1.778 m)   Wt 94.3 kg   SpO2 100%   BMI 29.84 kg/m   Physical Exam Vitals and nursing note reviewed.  Constitutional:      General: He is not in acute distress.    Appearance: He is well-developed.  HENT:     Head: Normocephalic and atraumatic.  Eyes:     Conjunctiva/sclera: Conjunctivae normal.  Cardiovascular:     Rate and Rhythm: Normal rate and regular rhythm.     Heart sounds: No murmur heard. Pulmonary:     Effort: Pulmonary effort is normal. No respiratory distress.     Breath sounds: Normal breath sounds.  Abdominal:      Palpations: Abdomen is soft.     Tenderness: There is no abdominal tenderness.  Musculoskeletal:        General: Swelling and tenderness present.     Cervical back: Neck supple.     Comments: Swollen right knee with limited range of motion due to pain.  Able to range ankle and toes, sensations intact, strong DP pulses  Skin:    General: Skin is warm and dry.     Capillary Refill: Capillary refill takes less than 2 seconds.  Neurological:     Mental Status: He is alert.  Psychiatric:        Mood and Affect: Mood normal.     (all labs ordered are listed, but only abnormal results are displayed) Labs Reviewed  BASIC METABOLIC PANEL WITH GFR - Abnormal; Notable for the following components:      Result Value   Glucose, Bld 127 (*)    Creatinine, Ser 1.55 (*)    GFR, Estimated 50 (*)    All other components within normal limits  CBC    EKG: None  Radiology: DG Knee Complete 4 Views Right Result Date: 08/30/2024 CLINICAL DATA:  injury. EXAM: RIGHT KNEE - COMPLETE 4+ VIEW COMPARISON:  None Available. FINDINGS: No acute fracture or dislocation. No aggressive osseous lesion. Redemonstration of right total knee arthroplasty with patellar resurfacing. There is comminuted fracture of the distal right femur just above the femoral implant. There is associated small left suprapatellar knee joint effusion. No focal soft tissue swelling. No radiopaque foreign bodies. IMPRESSION: *Comminuted fracture of the distal right femur just above the femoral implant. Electronically Signed   By: Ree Molt M.D.   On: 08/30/2024 13:52     Procedures   Medications Ordered in the ED - No data to display                                   Medical Decision Making Amount and/or Complexity of Data Reviewed Radiology: ordered.   63 year old male presenting with right knee pain after stepping down, x-rays consistent with comminuted fracture of the distal right femur just above the femoral implant.  No  evidence of open fracture. CBC and BMP are unremarkable.  I discussed findings with orthopedics, who offered discharge with any immobilizer and follow-up in the outpatient setting or admission to the hospitalist with plans for surgery tomorrow.  I discussed the plan with the patient who elected to be admitted to have this repaired tomorrow.  Orthopedics aware.  Consulted hospitalist for admission.     Final diagnoses:  Closed displaced comminuted fracture of shaft of right femur, initial encounter [facility])    ED Discharge Orders     None  Vonn Hadassah LABOR, PA-C 08/30/24 1534    Freddi Hamilton, MD 09/02/24 [phone]

## 2024-08-30 NOTE — Consult Note (Signed)
 Reason for Consult:Right distal femur fx Referring Physician: Glendia Breeding Time called: 1518 Time at bedside: 1523   Brendan [name] is an 63 y.o. male.  HPI: Brendan [name] was coming down some steps when he felt/heard a pop in his right knee. He was able to stop himself from falling. He sat down for a while and tried to get up but felt like his knee was unstable and did not put any further weight on it. He was brought to the ED where x-rays showed a right periprosthetic distal femur fx and orthopedic surgery was consulted. He lives at home with his wife and doesn't use any assistive devices to ambulate.  Past Medical History:  Diagnosis Date   Coronary artery disease    Diabetes mellitus without complication (HCC)    Hyperlipidemia    Hypertension    Kidney disease    Sarcoidosis    Stroke [facility])     Past Surgical History:  Procedure Laterality Date   CARDIAC CATHETERIZATION     JOINT REPLACEMENT Bilateral    TKR   PATENT FORAMEN OVALE(PFO) CLOSURE N/A 08/05/2024   Procedure: PATENT FORAMEN OVALE(PFO) CLOSURE;  Surgeon: Wendel Lurena POUR, MD;  Location: [facility] INVASIVE CV LAB;  Service: Cardiovascular;  Laterality: N/A;   RIGHT/LEFT HEART CATH AND CORONARY ANGIOGRAPHY N/A 08/01/2022   Procedure: RIGHT/LEFT HEART CATH AND CORONARY ANGIOGRAPHY;  Surgeon: Jordan, Peter M, MD;  Location: [facility] INVASIVE CV LAB;  Service: Cardiovascular;  Laterality: N/A;   ROTATOR CUFF REPAIR Left    TRANSESOPHAGEAL ECHOCARDIOGRAM (CATH LAB) N/A 06/15/2024   Procedure: TRANSESOPHAGEAL ECHOCARDIOGRAM;  Surgeon: Mona Vinie JAYSON, MD;  Location: [facility] INVASIVE CV LAB;  Service: Cardiovascular;  Laterality: N/A;    Family History  Problem Relation Age of Onset   Heart failure Mother 28    Social History:  reports that he has never smoked. He has never used smokeless tobacco. He reports that he does not currently use alcohol. He reports that he does not currently use drugs.  Allergies:  Allergies  Allergen  Reactions   Latex Swelling   Lisinopril Swelling   Losartan Swelling   Metformin Nausea Only   Sildenafil Palpitations    Other reaction(s): Headache, Palpitation, Headache, Palpitation, Headache, Palpitations    Medications: I have reviewed the patient's current medications.  Results for orders placed or performed during the hospital encounter of 08/30/24 (from the past 48 hours)  CBC     Status: None   Collection Time: 08/30/24  1:34 PM  Result Value Ref Range   WBC 8.3 4.0 - 10.5 K/uL   RBC 4.61 4.22 - 5.81 MIL/uL   Hemoglobin 13.8 13.0 - 17.0 g/dL   HCT 57.6 60.9 - 47.9 %   MCV 91.8 80.0 - 100.0 fL   MCH 29.9 26.0 - 34.0 pg   MCHC 32.6 30.0 - 36.0 g/dL   RDW 86.4 88.4 - 84.4 %   Platelets 188 150 - 400 K/uL   nRBC 0.0 0.0 - 0.2 %    Comment: Performed at [facility], [address], [place], [place] [address]  Basic metabolic panel     Status: Abnormal   Collection Time: 08/30/24  1:34 PM  Result Value Ref Range   Sodium 140 135 - 145 mmol/L   Potassium 4.0 3.5 - 5.1 mmol/L   Chloride 104 98 - 111 mmol/L   CO2 25 22 - 32 mmol/L   Glucose, Bld 127 (H) 70 - 99 mg/dL    Comment: Glucose  reference range applies only to samples taken after fasting for at least 8 hours.   BUN 12 8 - 23 mg/dL   Creatinine, Ser 8.44 (H) 0.61 - 1.24 mg/dL   Calcium  9.6 8.9 - 10.3 mg/dL   GFR, Estimated 50 (L) >60 mL/min    Comment: (NOTE) Calculated using the CKD-EPI Creatinine Equation (2021)    Anion gap 11 5 - 15    Comment: Performed at [facility], [address] [place], [place] [address]    DG Knee Complete 4 Views Right Result Date: 08/30/2024 CLINICAL DATA:  injury. EXAM: RIGHT KNEE - COMPLETE 4+ VIEW COMPARISON:  None Available. FINDINGS: No acute fracture or dislocation. No aggressive osseous lesion. Redemonstration of right total knee arthroplasty with patellar resurfacing. There is comminuted fracture of the distal right femur just above the femoral implant. There  is associated small left suprapatellar knee joint effusion. No focal soft tissue swelling. No radiopaque foreign bodies. IMPRESSION: *Comminuted fracture of the distal right femur just above the femoral implant. Electronically Signed   By: Ree Molt M.D.   On: 08/30/2024 13:52    Review of Systems  HENT:  Negative for ear discharge, ear pain, hearing loss and tinnitus.   Eyes:  Negative for photophobia and pain.  Respiratory:  Negative for cough and shortness of breath.   Cardiovascular:  Negative for chest pain.  Gastrointestinal:  Negative for abdominal pain, nausea and vomiting.  Genitourinary:  Negative for dysuria, flank pain, frequency and urgency.  Musculoskeletal:  Positive for arthralgias (Right knee). Negative for back pain, myalgias and neck pain.  Neurological:  Negative for dizziness and headaches.  Hematological:  Does not bruise/bleed easily.  Psychiatric/Behavioral:  The patient is not nervous/anxious.    Blood pressure 134/81, pulse 91, temperature 98.4 F (36.9 C), resp. rate 18, height 5' 10 (1.778 m), weight 94.3 kg, SpO2 100%. Physical Exam Constitutional:      General: He is not in acute distress.    Appearance: He is well-developed. He is not diaphoretic.  HENT:     Head: Normocephalic and atraumatic.  Eyes:     General: No scleral icterus.       Right eye: No discharge.        Left eye: No discharge.     Conjunctiva/sclera: Conjunctivae normal.  Cardiovascular:     Rate and Rhythm: Normal rate and regular rhythm.  Pulmonary:     Effort: Pulmonary effort is normal. No respiratory distress.  Musculoskeletal:     Cervical back: Normal range of motion.     Comments: RLE No traumatic wounds, ecchymosis, or rash  Mod TTP knee  No ankle effusion  Sens DPN, SPN, TN intact  Motor EHL, ext, flex, evers 5/5  DP 2+, PT 2+, No significant edema  Skin:    General: Skin is warm and dry.  Neurological:     Mental Status: He is alert.  Psychiatric:         Mood and Affect: Mood normal.        Behavior: Behavior normal.     Assessment/Plan: Right distal femur fx -- Plan ORIF tomorrow with Dr. Celena. Please keep NPO after MN. Multiple medical problems including hypertension, sarcoidosis, hyperlipidemia, CAD, stroke, kidney disease, diabetes, recent PFO closure, and prior bilateral knee replacements -- per primary service    Ozell DOROTHA Ned, PA-C Orthopedic Surgery [phone] 08/30/2024, 3:31 PM

## 2024-08-30 NOTE — Progress Notes (Signed)
 Orthopedic Tech Progress Note Patient Details:  Brendan [name] 1961-09-12 [id number]  Spoke with ORTHO PA within passing and he stated could we apply a KNEE IMMOBILIZER to patient.  Ortho Devices Type of Ortho Device: Knee Immobilizer Ortho Device/Splint Location: RLE Ortho Device/Splint Interventions: Ordered, Application, Adjustment   Post Interventions Patient Tolerated: Well Instructions Provided: Care of device  [name] Pac 08/30/2024, 4:00 PM

## 2024-08-30 NOTE — ED Notes (Signed)
 Ortho PA and orthotech at [facility]

## 2024-08-30 NOTE — ED Triage Notes (Signed)
 Pt BIB [facility] from home d/t stepping down with his Rt foot on stairs & feeling/hearing a pop in that Rt knee. Hx of knee relacement in that knee, did not fall, does take plavix , A/Ox4. EMS reports Patella is seen to be towards the outer Rt side of knee & they splinted it on scene. Pt rates pain 1/10 when sitting still & when moved to stretcher rates it 5/10. Rt pedal pulses palpable, VSS at  SBP 136, 80 bpm, 18 resp, 98% on RA, CBG 221.

## 2024-08-30 NOTE — ED Provider Triage Note (Signed)
 Emergency Medicine Provider Triage Evaluation Note  Brendan [name] , a 63 y.o. male  was evaluated in triage.  Pt complains of right knee injury. Reports that he took a step down the stairs and suddenly felt pain. Has been unable to walk since. Reports hx several knee surgeries previously  Review of Systems  Positive:  Negative:   Physical Exam  BP 134/81 (BP Location: Right Arm)   Pulse 91   Temp 98.4 F (36.9 C)   Resp 18   Ht 5' 10 (1.778 m)   Wt 94.3 kg   SpO2 100%   BMI 29.84 kg/m  Gen:   Awake, no distress   Resp:  Normal effort  MSK:   Moves extremities without difficulty  Other:  DP and PT pulses present  Medical Decision Making  Medically screening exam initiated at 1:34 PM.  Appropriate orders placed.  Brendan [name] was informed that the remainder of the evaluation will be completed by another provider, this initial triage assessment does not replace that evaluation, and the importance of remaining in the ED until their evaluation is complete.    Brendan Lauraine LABOR, PA-C 08/30/24 1335

## 2024-08-30 NOTE — Consult Note (Signed)
 CARDIOLOGY CONSULT NOTE       Patient ID: Brendan [name] MRN: [id number] DOB/AGE: [birth year] 63 y.o.  Admit date: 08/30/2024 Referring Physician: Alto Primary Physician: [name] [name] Primary Cardiologist: Jordan Reason for Consultation: Preoperative/CAD  Principal Problem:   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   HPI:  63 y.o. with history of bilateral TKR;s Had a mechanical fall with Right comminuted fracture of the distal right femur just above the femoral implant. He has a history of DM, HLD, HTN, Sarcoid and CAD. CAD has been stable with no angina Cath 08/01/22 with 70% mid LAD and 75% mid LCX dx. Moderate main 2 vessel dx with severe stenosis in two small diagonal branches not amenable to PCI  Prior to cath 07/25/22 he had non ischemic myovue. He is retired scientist, research (physical sciences) and worked on [employer] during [employer] first term. Can do > 5 METS with no agnina. EF normal on TTE 08/05/24 55-60% and no significant valve dx. ECG 07/22/24 SR LAD nonspecific ST changes   ROS All other systems reviewed and negative except as noted above  Past Medical History:  Diagnosis Date   Coronary artery disease    Diabetes mellitus without complication (HCC)    Hyperlipidemia    Hypertension    Kidney disease    Sarcoidosis    Stroke [facility])     Family History  Problem Relation Age of Onset   Heart failure Mother 75    Social History   Socioeconomic History   Marital status: Married    Spouse name: Not on file   Number of children: 3   Years of education: 16   Highest education level: Bachelor's degree (e.g., BA, AB, BS)  Occupational History   Occupation: Retired  Tobacco Use   Smoking status: Never   Smokeless tobacco: Never  Substance and Sexual Activity   Alcohol use: Not Currently   Drug use: Not Currently   Sexual activity: Not on file  Other Topics Concern   Not on file  Social History Narrative   Retired art gallery manager  for [employer]   Social Drivers of Health   Financial Resource Strain: Low Risk  (03/24/2024)   Received from [facility]   Overall Financial Resource Strain (CARDIA)    Difficulty of Paying Living Expenses: Not hard at all  Food Insecurity: No Food Insecurity (03/24/2024)   Received from [facility]   Hunger Vital Sign    Within the past 12 months, you worried that your food would run out before you got the money to buy more.: Never true    Within the past 12 months, the food you bought just didn't last and you didn't have money to get more.: Never true  Transportation Needs: No Transportation Needs (03/24/2024)   Received from [facility] - Transportation    Lack of Transportation (Medical): No    Lack of Transportation (Non-Medical): No  Physical Activity: Not on file  Stress: Not on file  Social Connections: Unknown (02/27/2022)   Received from [facility]   Social Network    Social Network: Not on file  Intimate Partner Violence: Not At Risk (02/28/2024)   Humiliation, Afraid, Rape, and Kick questionnaire    Fear of Current or Ex-Partner: No    Emotionally Abused: No    Physically Abused: No    Sexually Abused: No    Past Surgical History:  Procedure Laterality Date   CARDIAC CATHETERIZATION     JOINT  REPLACEMENT Bilateral    TKR   PATENT FORAMEN OVALE(PFO) CLOSURE N/A 08/05/2024   Procedure: PATENT FORAMEN OVALE(PFO) CLOSURE;  Surgeon: Wendel Lurena POUR, MD;  Location: [facility] INVASIVE CV LAB;  Service: Cardiovascular;  Laterality: N/A;   RIGHT/LEFT HEART CATH AND CORONARY ANGIOGRAPHY N/A 08/01/2022   Procedure: RIGHT/LEFT HEART CATH AND CORONARY ANGIOGRAPHY;  Surgeon: Jordan, Bralyn Espino M, MD;  Location: [facility] INVASIVE CV LAB;  Service: Cardiovascular;  Laterality: N/A;   ROTATOR CUFF REPAIR Left    TRANSESOPHAGEAL ECHOCARDIOGRAM (CATH LAB) N/A 06/15/2024   Procedure: TRANSESOPHAGEAL ECHOCARDIOGRAM;  Surgeon: Mona Vinie BROCKS, MD;  Location: [facility] INVASIVE CV LAB;  Service:  Cardiovascular;  Laterality: N/A;      Current Facility-Administered Medications:    acetaminophen  (TYLENOL ) tablet 650 mg, 650 mg, Oral, Q6H PRN **OR** acetaminophen  (TYLENOL ) suppository 650 mg, 650 mg, Rectal, Q6H PRN, Alto Isaiah CROME, NP   aspirin  EC tablet 81 mg, 81 mg, Oral, Daily, Alto Isaiah CROME, NP   enoxaparin (LOVENOX) injection 40 mg, 40 mg, Subcutaneous, Q24H, Alto Isaiah CROME, NP   insulin  aspart (novoLOG ) injection 0-5 Units, 0-5 Units, Subcutaneous, QHS, Alto Isaiah CROME, NP   insulin  aspart (novoLOG ) injection 0-9 Units, 0-9 Units, Subcutaneous, TID WC, Alto Isaiah CROME, NP   morphine (PF) 2 MG/ML injection 2 mg, 2 mg, Intravenous, Q2H PRN, Alto Isaiah CROME, NP   ondansetron  (ZOFRAN ) tablet 4 mg, 4 mg, Oral, Q6H PRN **OR** ondansetron  (ZOFRAN ) injection 4 mg, 4 mg, Intravenous, Q6H PRN, Alto Isaiah CROME, NP   oxyCODONE (Oxy IR/ROXICODONE) immediate release tablet 5 mg, 5 mg, Oral, Q4H PRN, Alto Isaiah CROME, NP   sodium chloride  flush (NS) 0.9 % injection 3 mL, 3 mL, Intravenous, Q12H, Alto Isaiah CROME, NP  Current Outpatient Medications:    albuterol (VENTOLIN HFA) 108 (90 Base) MCG/ACT inhaler, Inhale 1-2 puffs into the lungs every 6 (six) hours as needed for shortness of breath or wheezing., Disp: , Rfl:    amLODipine (NORVASC) 10 MG tablet, Take 1 tablet (10 mg total) by mouth daily., Disp: 90 tablet, Rfl: 1   amoxicillin  (AMOXIL ) 500 MG capsule, TAKE FOUR CAPSULES BY MOUTH ONCE 1 HOUR PRIOR TO THE DENTAL TREATMENT, Disp: 4 capsule, Rfl: 3   aspirin  EC 81 MG tablet, Take 1 tablet (81 mg total) by mouth daily. Swallow whole., Disp: 150 tablet, Rfl: 2   atorvastatin  (LIPITOR ) 80 MG tablet, Take 80 mg by mouth at bedtime., Disp: , Rfl:    clobetasol cream (TEMOVATE) 0.05 %, Apply 1 Application topically daily as needed (Psoriasis)., Disp: , Rfl:    clopidogrel  (PLAVIX ) 75 MG tablet, Take 1 tablet (75 mg total) by mouth daily., Disp: 90 tablet, Rfl: 3   diclofenac Sodium  (VOLTAREN) 1 % GEL, Apply 2 g topically daily as needed (Shoulder paon)., Disp: , Rfl:    empagliflozin  (JARDIANCE ) 25 MG TABS tablet, Take 1 tablet (25 mg total) by mouth daily., Disp: 30 tablet, Rfl: 10   EPINEPHrine  0.3 mg/0.3 mL IJ SOAJ injection, Inject 0.3 mg into the muscle as needed for anaphylaxis., Disp: 1 each, Rfl: 0   escitalopram  (LEXAPRO ) 10 MG tablet, Take 10 mg by mouth at bedtime., Disp: , Rfl:    ezetimibe  (ZETIA ) 10 MG tablet, Take 10 mg by mouth daily., Disp: , Rfl:    mometasone -formoterol  (DULERA) 100-5 MCG/ACT AERO, Inhale 2 puffs into the lungs in the morning and at bedtime. (Patient taking differently: Inhale 2 puffs into the lungs 2 (two) times daily as needed (sarcoidosis).), Disp:  26.4 g, Rfl: 11   pantoprazole  (PROTONIX ) 40 MG tablet, Take 1 tablet (40 mg total) by mouth daily., Disp: 90 tablet, Rfl: 3   Semaglutide ,0.25 or 0.5MG /DOS, 2 MG/3ML SOPN, Inject 0.5 mg into the skin once a week. "sunday, Disp: , Rfl:    traZODone (DESYREL) 100 MG tablet, Take 100 mg by mouth at bedtime., Disp: , Rfl:    Vitamin D, Ergocalciferol, (DRISDOL) 1.25 MG (50000 UNIT) CAPS capsule, Take 50,000 Units by mouth every 7 (seven) days. monday, Disp: , Rfl:    zolpidem (AMBIEN) 10 MG tablet, Take 10 mg by mouth at bedtime as needed for sleep., Disp: , Rfl:   aspirin EC  81 mg Oral Daily   enoxaparin (LOVENOX) injection  40 mg Subcutaneous Q24H   insulin aspart  0-5 Units Subcutaneous QHS   insulin aspart  0-9 Units Subcutaneous TID WC   sodium chloride flush  3 mL Intravenous Q12H     Physical Exam: Blood pressure 134/81, pulse 91, temperature 98.4 F (36.9 C), resp. rate 18, height 5' 10 (1.778 m), weight 94.3 kg, SpO2 100%.    Affect appropriate Healthy:  appears stated age HEENT: normal Neck supple with no adenopathy JVP normal no bruits no thyromegaly Lungs clear with no wheezing and good diaphragmatic motion Heart:  S1/S2 no murmur, no rub, gallop or click PMI  normal Abdomen: benighn, BS positve, no tenderness, no AAA no bruit.  No HSM or HJR Distal pulses intact with no bruits No edema Neuro non-focal Skin warm and dry No muscular weakness   Labs:   Lab Results  Component Value Date   WBC 8.3 08/30/2024   HGB 13.8 08/30/2024   HCT 42.3 08/30/2024   MCV 91.8 08/30/2024   PLT 188 08/30/2024    Recent Labs  Lab 08/30/24 1334  NA 140  K 4.0  CL 104  CO2 25  BUN 12  CREATININE 1.55*  CALCIUM 9.6  GLUCOSE 127*   No results found for: CKTOTAL, CKMB, CKMBINDEX, TROPONINI  Lab Results  Component Value Date   CHOL 97 02/27/2024   Lab Results  Component Value Date   HDL 35 (L) 02/27/2024   Lab Results  Component Value Date   LDLCALC 49 02/27/2024   Lab Results  Component Value Date   TRIG 65 02/27/2024   Lab Results  Component Value Date   CHOLHDL 2.8 02/27/2024   No results found for: LDLDIRECT    Radiology: DG Knee Complete 4 Views Right Result Date: 08/30/2024 CLINICAL DATA:  injury. EXAM: RIGHT KNEE - COMPLETE 4+ VIEW COMPARISON:  None Available. FINDINGS: No acute fracture or dislocation. No aggressive osseous lesion. Redemonstration of right total knee arthroplasty with patellar resurfacing. There is comminuted fracture of the distal right femur just above the femoral implant. There is associated small left suprapatellar knee joint effusion. No focal soft tissue swelling. No radiopaque foreign bodies. IMPRESSION: *Comminuted fracture of the distal right femur just above the femoral implant. Electronically Signed   By: Nisarg  Parikh M.D.   On: 08/30/2024 13:52   ECHOCARDIOGRAM LIMITED Result Date: 08/05/2024    ECHOCARDIOGRAM LIMITED REPORT   Patient Name:   Lovie C Surles Date of Exam: 08/05/2024 Medical Rec #:  [id number]             Height:       70.0 in Accession #:    [id number]            Weight:       20" 9.0  lb Date of Birth:  08/30/1961             BSA:          2.127 m Patient Age:    63 years               BP:           134/86 mmHg Patient Gender: M                     HR:           73 bpm. Exam Location:  Inpatient Procedure: Limited Echo and Limited Color Doppler Indications:    Elvaluation for PFO / PFO closure  History:        Patient has prior history of Echocardiogram examinations.  Sonographer:    Charmaine Gaskins Referring Phys: [id number] ARUN K THUKKANI IMPRESSIONS  1. Left ventricular ejection fraction, by estimation, is 55 to 60%. The left ventricle has normal function. The left ventricle has no regional wall motion abnormalities. There is mild concentric left ventricular hypertrophy. Left ventricular diastolic function could not be evaluated.  2. Right ventricular systolic function is normal. The right ventricular size is normal.  3. Amplatzer PFO closure device in place and well situated with no evidence of residual shunting.  4. The mitral valve is normal in structure. No evidence of mitral valve regurgitation. No evidence of mitral stenosis.  5. The aortic valve is tricuspid. There is mild calcification of the aortic valve. Aortic valve regurgitation is not visualized. No aortic stenosis is present. FINDINGS  Left Ventricle: Left ventricular ejection fraction, by estimation, is 55 to 60%. The left ventricle has normal function. The left ventricle has no regional wall motion abnormalities. The left ventricular internal cavity size was normal in size. There is  mild concentric left ventricular hypertrophy. Left ventricular diastolic function could not be evaluated. Right Ventricle: The right ventricular size is normal. No increase in right ventricular wall thickness. Right ventricular systolic function is normal. Left Atrium: Left atrial size was normal in size. Right Atrium: Right atrial size was normal in size. Pericardium: There is no evidence of pericardial effusion. Mitral Valve: The mitral valve is normal in structure. No evidence of mitral valve stenosis. Tricuspid Valve: The tricuspid  valve is normal in structure. Tricuspid valve regurgitation is trivial. No evidence of tricuspid stenosis. Aortic Valve: The aortic valve is tricuspid. There is mild calcification of the aortic valve. Aortic valve regurgitation is not visualized. No aortic stenosis is present. Pulmonic Valve: The pulmonic valve was not assessed. Pulmonic valve regurgitation is not visualized. No evidence of pulmonic stenosis. Aorta: The aortic root is normal in size and structure. Venous: The inferior vena cava was not well visualized. IAS/Shunts: No atrial level shunt detected by color flow Doppler. LEFT VENTRICLE PLAX 2D LVIDd:         5.40 cm LVIDs:         3.30 cm LV PW:         1.10 cm LV IVS:        1.20 cm  Toribio Fuel MD Electronically signed by Toribio Fuel MD Signature Date/Time: 08/05/2024/3:26:34 PM    Final    CARDIAC CATHETERIZATION Addendum Date: 08/05/2024 1.  Successful PFO closure with 30 mm Gore cardio form device with ICE imaging. Recommendations: Dual platelet therapy with Plavix  for 6 months then aspirin  monotherapy indefinitely.  Antibiotics for any dental work including cleanings for the next 6 months.  4 hours  bedrest and limited echo prior to discharge.  Addendum Date: 08/05/2024 1.  Successful PFO closure with 30 mm Gore cardio form device with ICE imaging. Recommendations: Dual platelet therapy with Plavix  for 6 months then aspirin  monotherapy indefinitely.  Antibiotics for any dental work including cleanings for the next 6 months.  4 hours bedrest and limited echo prior to discharge.  Result Date: 08/05/2024 1.  Successful PFO closure with 30 mm Gore cardio form device. Recommendations: Dual platelet therapy with Plavix  for 6 months then aspirin  monotherapy indefinitely.  Antibiotics for any dental work including cleanings for the next 6 months.  4 hours bedrest and limited echo prior to discharge.    EKG: see HPI needs update in ER   ASSESSMENT AND PLAN:   Preoperative/CAD:   active with no angina and normal EF. Known moderate main 2 vessel dx in mid LCX/LAD and severe dx in two small diagonal branches. Will get baseline ECG in ER. Hct ok at 42.3 Start low dose lopressor. 81 mg ASA and resume plavix  postoperatively with no load. If BP elevated consider adding low dose imdur 15 mg daily. Follow symptoms and ECG post operatively. He understands there is a chance of cardiac event, angina, MI in the perioperative period especially if he gets blood loss, hypotensive or prolonged surgery.   PFO:  successful closure by Dr Verba 08/05/24 should not be a factor for traumatic orthopedic surgery Post procedure TTE with no residual shunting   Discussed cardiac issues with patient , wife and children   Signed: Maude Emmer 08/30/2024, 5:29 PM

## 2024-08-30 NOTE — Plan of Care (Signed)

## 2024-08-30 NOTE — H&P (Signed)
 History and Physical    Patient: Brendan [name] FMW:[id number] DOB: [birth year] DOA: 08/30/2024 DOS: the patient was seen and examined on 08/30/2024 PCP: [name] LABOR, PA-C  Patient coming from: Home-lives with wife  Chief Complaint:  Chief Complaint  Patient presents with   Knee Injury   HPI: Brendan [name] is a 63 y.o. 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  He recently had CVA back in May of this year and previously had had CVA x 2 in 2021.  During that admission he was found to have a PFO and has followed up with cardiology and neurology post admission.  He recently underwent PFO closure on October 9 and is currently on DAPT.  He also has a past surgical history of right TKR.  Patient was leaving his home today when he stepped down and his right leg gave way at the knee.  Initially it was painful but with stabilization pain had improved.  He did not have any trauma prior to this episode.  His sensation has remained intact.  Per his description knee region is quite unstable.  He was brought to the ED via EMS.  Workup in the ED reveals comminuted fracture of the distal right femur just above the femoral implant.  Vital signs have otherwise been stable.  Labs are stable and creatinine is at baseline.  Plan is for operative intervention on 11/4 per the orthopedic team.  Hospital service consulted to evaluate the patient for admission.  The patient reports that typically he and his wife walk about 4 miles per day.   Review of Systems: As mentioned in the history of present illness. All other systems reviewed and are negative.  Past Medical History:  Diagnosis Date   Coronary artery disease    Diabetes mellitus without complication (HCC)    Hyperlipidemia    Hypertension    Kidney disease    Sarcoidosis    Stroke [facility])    Past  Surgical History:  Procedure Laterality Date   CARDIAC CATHETERIZATION     JOINT REPLACEMENT Bilateral    TKR   PATENT FORAMEN OVALE(PFO) CLOSURE N/A 08/05/2024   Procedure: PATENT FORAMEN OVALE(PFO) CLOSURE;  Surgeon: Wendel Lurena POUR, MD;  Location: [facility] INVASIVE CV LAB;  Service: Cardiovascular;  Laterality: N/A;   RIGHT/LEFT HEART CATH AND CORONARY ANGIOGRAPHY N/A 08/01/2022   Procedure: RIGHT/LEFT HEART CATH AND CORONARY ANGIOGRAPHY;  Surgeon: Jordan, Peter M, MD;  Location: [facility] INVASIVE CV LAB;  Service: Cardiovascular;  Laterality: N/A;   ROTATOR CUFF REPAIR Left    TRANSESOPHAGEAL ECHOCARDIOGRAM (CATH LAB) N/A 06/15/2024   Procedure: TRANSESOPHAGEAL ECHOCARDIOGRAM;  Surgeon: Mona Vinie JAYSON, MD;  Location: [facility] INVASIVE CV LAB;  Service: Cardiovascular;  Laterality: N/A;   Social History:  reports that he has never smoked. He has never used smokeless tobacco. He reports that he does not currently use alcohol. He reports that he does not currently use drugs.  Allergies  Allergen Reactions   Latex Swelling   Lisinopril Swelling   Losartan Swelling   Metformin Nausea Only   Sildenafil Palpitations    Other reaction(s): Headache, Palpitation, Headache, Palpitation, Headache, Palpitations    Family History  Problem Relation Age of Onset   Heart failure Mother 96    Prior to Admission medications   Medication Sig Start Date End Date Taking? Authorizing Provider  albuterol (VENTOLIN HFA) 108 (90  Base) MCG/ACT inhaler Inhale 1-2 puffs into the lungs every 6 (six) hours as needed for shortness of breath or wheezing. 04/20/21   [provider]  amLODipine (NORVASC) 10 MG tablet Take 1 tablet (10 mg total) by mouth daily. 08/25/24   Jordan, Peter M, MD  amoxicillin  (AMOXIL ) 500 MG capsule TAKE FOUR CAPSULES BY MOUTH ONCE 1 HOUR PRIOR TO THE DENTAL TREATMENT 07/15/22   Jordan, Peter M, MD  aspirin  EC 81 MG tablet Take 1 tablet (81 mg total) by mouth daily. Swallow whole. 08/05/24  08/05/25  Thukkani, Arun K, MD  atorvastatin  (LIPITOR ) 80 MG tablet Take 80 mg by mouth at bedtime. 09/17/22 08/05/24  [provider]  clobetasol cream (TEMOVATE) 0.05 % Apply 1 Application topically daily as needed (Psoriasis). 07/29/22   [provider]  clopidogrel  (PLAVIX ) 75 MG tablet Take 1 tablet (75 mg total) by mouth daily. 03/31/24   Jordan, Peter M, MD  diclofenac Sodium (VOLTAREN) 1 % GEL Apply 2 g topically daily as needed (Shoulder paon). 06/27/23   [provider]  empagliflozin  (JARDIANCE ) 25 MG TABS tablet Take 1 tablet (25 mg total) by mouth daily. 08/05/24   Thukkani, Arun K, MD  EPINEPHrine  0.3 mg/0.3 mL IJ SOAJ injection Inject 0.3 mg into the muscle as needed for anaphylaxis. 01/31/22   Jarold Olam HERO, PA-C  escitalopram  (LEXAPRO ) 10 MG tablet Take 10 mg by mouth at bedtime. 10/05/21   [provider]  ezetimibe  (ZETIA ) 10 MG tablet Take 10 mg by mouth daily. 04/21/23 08/05/24  [provider]  mometasone -formoterol  (DULERA) 100-5 MCG/ACT AERO Inhale 2 puffs into the lungs in the morning and at bedtime. Patient taking differently: Inhale 2 puffs into the lungs 2 (two) times daily as needed (sarcoidosis). 12/27/22   Parrett, Madelin RAMAN, NP  pantoprazole  (PROTONIX ) 40 MG tablet Take 1 tablet (40 mg total) by mouth daily. 03/31/24   Jordan, Peter M, MD  Semaglutide ,0.25 or 0.5MG /DOS, 2 MG/3ML SOPN Inject 0.5 mg into the skin once a week. sunday 01/03/23   [provider]  traZODone (DESYREL) 100 MG tablet Take 100 mg by mouth at bedtime. 12/27/21   [provider]  Vitamin D , Ergocalciferol , (DRISDOL ) 1.25 MG (50000 UNIT) CAPS capsule Take 50,000 Units by mouth every 7 (seven) days. monday 07/03/23   [provider]  zolpidem (AMBIEN) 10 MG tablet Take 10 mg by mouth at bedtime as needed for sleep. 10/05/21 08/05/24  [provider]    Physical Exam: Vitals:   08/30/24 1253 08/30/24 1254  BP:  134/81  Pulse:  91  Resp:   18  Temp:  98.4 F (36.9 C)  SpO2:  100%  Weight: 94.3 kg   Height: 5' 10 (1.778 m)    Constitutional: NAD, calm, comfortable Respiratory: clear to auscultation bilaterally, no wheezing, no crackles. Normal respiratory effort. No accessory muscle use.  Cardiovascular: Regular rate and rhythm, no murmurs / rubs / gallops. No extremity edema. 2+ pedal pulses. No carotid bruits.  Abdomen: no tenderness, no masses palpated. No hepatosplenomegaly. Bowel sounds positive.  Musculoskeletal: no clubbing / cyanosis. No joint deformity upper extremities.  Right lower extremity joint deformity previously reproduced with PROM by prior examiners.  This was not repeated during my exam since this without elicit pain. Normal muscle tone.  Skin: no rashes, lesions, ulcers. No induration Neurologic: CN 2-12 grossly intact. Sensation intact, Strength 5/5 although diminished in right lower extremity secondary to current injury to distal femur. Psychiatric: Normal judgment and  insight. Alert and oriented x 3. Normal mood.    Data Reviewed:  Sodium 140, potassium 4.0, CO2 25, glucose 127, BUN 12, creatinine 1.55, anion gap 11, GFR 50  WBC 8300 differential not obtained, hemoglobin 13.8, platelets 188,000  Right knee imaging as above  Assessment and Plan: Nontraumatic comminuted fracture distal femur/periprosthetic fracture Appreciate assistance of orthopedic team Nontraumatic etiology-patient does have a history of regular steroid use for 1-1/2 years prior to remission of active pulmonary sarcoid; also takes regular vitamin D  Check vitamin D  level N.p.o. after midnight for surgical intervention Use both narcotic and nonnarcotic modalities to treat pain Physical therapy in the postoperative setting  Recent PFO closure on DAPT/recent CVA Most recent CVA with subtle right side symptoms that resolved prior to presentation to the hospital Continue baby aspirin  and Plavix  as well as statin Cardiology  notified of admission and need for possible consultation  Pulmonary sarcoid in remission without underlying ILD Dulera non formulary so we will utilize [employer] while here Not on chronic oxygen Followed by [facility] in the outpatient setting  Diabetes mellitus 2 Hold Jardiance  for now Carb modified diet Follow CBGs and provide SSI Check hemoglobin A1c-hemoglobin A1c in May was 7.0  CKD stage IIIa Current creatinine stable and at baseline  Hypertension Continue Norvasc  OSA previously on CPAP Can use at home CPAP settings of patient is continuing regular use of CPAP   Advance Care Planning:   Code Status: Full Code   VTE prophylaxis: Lovenox-first dose tonight at 5 PM then resumption postoperatively at discretion of orthopedic team  Consults: Orthopedic  Family Communication: Patient  Severity of Illness: The appropriate patient status for this patient is OBSERVATION. Observation status is judged to be reasonable and necessary in order to provide the required intensity of service to ensure the patient's safety. The patient's presenting symptoms, physical exam findings, and initial radiographic and laboratory data in the context of their medical condition is felt to place them at decreased risk for further clinical deterioration. Furthermore, it is anticipated that the patient will be medically stable for discharge from the hospital within 2 midnights of admission.   Author: Isaiah Lever, NP 08/30/2024 4:50 PM  For on call review [email or web address].

## 2024-08-30 NOTE — Hospital Course (Addendum)
 Brendan [name] is a 63 y.o. male with PMH of with PMH of  CVA on plavix , history of sarcoidosis, diabetes mellitus, left knee replacement, hyperlipidemia, hypertension, diabetes mellitus on insulin  presented after hearing a pop on the right knee while stepping down and not able to stop himself from falling, sat down for a while and tried to get up but felt like his knee was unstable and did not put any weight and brought to the ED. In the ED hemodynamically stable, labs with creatinine 1.5, x-ray showed right periprosthetic distal femur fracture and orthopedic was consulted and admission requested under medicine service for further management. Patient underwent IM nailing right femur 11/4 postop fairly doing okay  Subjective: Seen and examined Patient reports being orthostatic dizzy on standing and walking Overnight has been afebrile more than 24 hours Labs reviewed creatinine stable at 1.7 hemoglobin 10.1 WBC 10.7 platelet 101 Mild cough  Assessment and plan:  Right periprosthetic distal femur fracture History of right knee replacement: Orthopedic input appreciated.  S/P IM nailing of the right femur 11/4. Continue aspirin  and Plavix  for DVT prophylaxis.  Continue PT OT pain management Okay for unrestricted range of motion of the knee and hip early mobilization and touchdown weightbearing on right leg. TCC ended during operations question pathologic fracture, order myeloma panel, patient wants to follow-up with internal medicine or family practice at [facility] consulted   Recent PFO closure CVA on plavix /aspirin  CAD HLD: Cardiology input appreciated.  Continue Coreg, aspirin  Plavix  statin.   Orthostatic hypotension: Patient received IV fluid bolus yesterday, amlodipine on hold-add compression stocking, keep on IV fluid hydration overnight   Hypertension: Continue Coreg. Amlodipine on hold  Fever episode 10/4 night: Has been afebrile more than 24 hours no signs of infections  otherwise.  CKD stage IIIa: Baseline creatinine around 1.5, stable to slightly worse encourage oral hydration.Continue to monitor  history of sarcoidosis Stable.  Diabetes mellitus on insulin : Blood sugar well-controlled -on sliding scale insulin   Recent Labs  Lab 08/31/24 0453 08/31/24 0556 09/01/24 1116 09/01/24 1625 09/01/24 2047 09/02/24 0903 09/02/24 1114  GLUCAP  --    < > 129* 151* 151* 176* 178*  HGBA1C 6.7*  --   --   --   --   --   --    < > = values in this interval not displayed.    Mobility: PT Orders: Active PT Follow up Rec: Home Health Pt11/02/2024 1400    DVT prophylaxis: SCDs Start: 08/31/24 1134 aspirin /Plavix  Code Status:   Code Status: Full Code Family Communication: plan of care discussed with patient/ at bedside. Patient status is: Remains hospitalized because of severity of illness Level of care: Med-Surg   Dispo: The patient is from: home             Anticipated disposition: home w/ hh ~ 24 hrs Objective: Vitals last 24 hrs: Vitals:   09/02/24 0425 09/02/24 0750 09/02/24 0842 09/02/24 0919  BP: 122/66 116/69  94/68  Pulse: (!) 102 97    Resp:  17    Temp: 98 F (36.7 C) 98.7 F (37.1 C)    TempSrc:  Oral    SpO2: 95% 95% 91%   Weight:      Height:        Physical Examination: General exam: alert awake, oriented, older than stated age HEENT:Oral mucosa moist, Ear/Nose WNL grossly Respiratory system: Bilaterally clear BS,no use of accessory muscle Cardiovascular system: S1 & S2 +, No JVD. Gastrointestinal system: Abdomen  soft,NT,ND, BS+ Nervous System: Alert, awake, moving all extremities,and following commands. Extremities: RLE dressing + Skin: Warm, no rashes MSK: Normal muscle bulk,tone, power   Medications reviewed:  Scheduled Meds:  acetaminophen   650 mg Oral Q6H   Or   acetaminophen   650 mg Rectal Q6H   amLODipine  5 mg Oral Daily   aspirin  EC  81 mg Oral Daily   atorvastatin   80 mg Oral QHS   carvedilol  6.25 mg Oral  BID WC   clopidogrel   75 mg Oral Daily   docusate sodium  100 mg Oral BID   fluticasone  furoate-vilanterol  1 puff Inhalation Daily   insulin  aspart  0-5 Units Subcutaneous QHS   insulin  aspart  0-9 Units Subcutaneous TID WC   pantoprazole   40 mg Oral Daily   sodium chloride  flush  3 mL Intravenous Q12H   Continuous Infusions:   Diet: Diet Order             Diet Carb Modified Fluid consistency: Thin; Room service appropriate? Yes  Diet effective now

## 2024-08-31 ENCOUNTER — Observation Stay (HOSPITAL_COMMUNITY)

## 2024-08-31 ENCOUNTER — Encounter (HOSPITAL_COMMUNITY): Admitting: Registered Nurse

## 2024-08-31 ENCOUNTER — Encounter (HOSPITAL_COMMUNITY): Payer: Self-pay | Admitting: Internal Medicine

## 2024-08-31 ENCOUNTER — Observation Stay (HOSPITAL_COMMUNITY): Admitting: Registered Nurse

## 2024-08-31 ENCOUNTER — Encounter (HOSPITAL_COMMUNITY)
Admission: EM | Disposition: A | Discharge status: Home Health | Payer: Self-pay | Source: Home / Self Care | Attending: Internal Medicine

## 2024-08-31 DIAGNOSIS — E785 Hyperlipidemia, unspecified: Secondary | ICD-10-CM | POA: Diagnosis present

## 2024-08-31 DIAGNOSIS — Z7902 Long term (current) use of antithrombotics/antiplatelets: Secondary | ICD-10-CM | POA: Diagnosis not present

## 2024-08-31 DIAGNOSIS — M9711XA 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, initial encounter: Secondary | ICD-10-CM | POA: Diagnosis present

## 2024-08-31 DIAGNOSIS — G4733 Obstructive sleep apnea (adult) (pediatric): Secondary | ICD-10-CM | POA: Diagnosis present

## 2024-08-31 DIAGNOSIS — E1122 Type 2 diabetes mellitus with diabetic chronic kidney disease: Secondary | ICD-10-CM | POA: Diagnosis present

## 2024-08-31 DIAGNOSIS — I2510 Atherosclerotic heart disease of native coronary artery without angina pectoris: Secondary | ICD-10-CM | POA: Diagnosis present

## 2024-08-31 DIAGNOSIS — Z96652 Presence of left artificial knee joint: Secondary | ICD-10-CM | POA: Diagnosis present

## 2024-08-31 DIAGNOSIS — Z91040 Latex allergy status: Secondary | ICD-10-CM | POA: Diagnosis not present

## 2024-08-31 DIAGNOSIS — N1831 Chronic kidney disease, stage 3a: Secondary | ICD-10-CM | POA: Diagnosis present

## 2024-08-31 DIAGNOSIS — R509 Fever, unspecified: Secondary | ICD-10-CM | POA: Diagnosis not present

## 2024-08-31 DIAGNOSIS — Z8249 Family history of ischemic heart disease and other diseases of the circulatory system: Secondary | ICD-10-CM | POA: Diagnosis not present

## 2024-08-31 DIAGNOSIS — E559 Vitamin D deficiency, unspecified: Secondary | ICD-10-CM | POA: Diagnosis present

## 2024-08-31 DIAGNOSIS — Z7982 Long term (current) use of aspirin: Secondary | ICD-10-CM | POA: Diagnosis not present

## 2024-08-31 DIAGNOSIS — M9702XA Periprosthetic fracture around internal prosthetic left hip joint, initial encounter: Secondary | ICD-10-CM | POA: Diagnosis present

## 2024-08-31 DIAGNOSIS — Z8673 Personal history of transient ischemic attack (TIA), and cerebral infarction without residual deficits: Secondary | ICD-10-CM | POA: Diagnosis not present

## 2024-08-31 DIAGNOSIS — Z8774 Personal history of (corrected) congenital malformations of heart and circulatory system: Secondary | ICD-10-CM | POA: Diagnosis not present

## 2024-08-31 DIAGNOSIS — Z7984 Long term (current) use of oral hypoglycemic drugs: Secondary | ICD-10-CM | POA: Diagnosis not present

## 2024-08-31 DIAGNOSIS — M84451A Pathological fracture, right femur, initial encounter for fracture: Secondary | ICD-10-CM | POA: Diagnosis present

## 2024-08-31 DIAGNOSIS — I951 Orthostatic hypotension: Secondary | ICD-10-CM | POA: Diagnosis not present

## 2024-08-31 DIAGNOSIS — Z7951 Long term (current) use of inhaled steroids: Secondary | ICD-10-CM | POA: Diagnosis not present

## 2024-08-31 DIAGNOSIS — Z79899 Other long term (current) drug therapy: Secondary | ICD-10-CM | POA: Diagnosis not present

## 2024-08-31 DIAGNOSIS — Z7985 Long-term (current) use of injectable non-insulin antidiabetic drugs: Secondary | ICD-10-CM | POA: Diagnosis not present

## 2024-08-31 DIAGNOSIS — I129 Hypertensive chronic kidney disease with stage 1 through stage 4 chronic kidney disease, or unspecified chronic kidney disease: Secondary | ICD-10-CM | POA: Diagnosis present

## 2024-08-31 DIAGNOSIS — Z794 Long term (current) use of insulin: Secondary | ICD-10-CM | POA: Diagnosis not present

## 2024-08-31 DIAGNOSIS — Z888 Allergy status to other drugs, medicaments and biological substances status: Secondary | ICD-10-CM | POA: Diagnosis not present

## 2024-08-31 HISTORY — PX: ORIF FEMUR FRACTURE: SHX2119

## 2024-08-31 LAB — GLUCOSE, CAPILLARY
Glucose-Capillary: 143 mg/dL — ABNORMAL HIGH (ref 70–99)
Glucose-Capillary: 129 mg/dL — ABNORMAL HIGH (ref 70–99)
Glucose-Capillary: 148 mg/dL — ABNORMAL HIGH (ref 70–99)
Glucose-Capillary: 100 mg/dL — ABNORMAL HIGH (ref 70–99)
Glucose-Capillary: 99 mg/dL (ref 70–99)
Glucose-Capillary: 126 mg/dL — ABNORMAL HIGH (ref 70–99)
Glucose-Capillary: 152 mg/dL — ABNORMAL HIGH (ref 70–99)

## 2024-08-31 LAB — HIV ANTIBODY (ROUTINE TESTING W REFLEX): HIV Screen 4th Generation wRfx: NONREACTIVE

## 2024-08-31 LAB — BASIC METABOLIC PANEL WITH GFR
Anion gap: 10 (ref 5–15)
BUN: 15 mg/dL (ref 8–23)
CO2: 23 mmol/L (ref 22–32)
Calcium: 8.9 mg/dL (ref 8.9–10.3)
Chloride: 105 mmol/L (ref 98–111)
Creatinine, Ser: 1.69 mg/dL — ABNORMAL HIGH (ref 0.61–1.24)
GFR, Estimated: 45 mL/min — ABNORMAL LOW (ref >60)
Glucose, Bld: 149 mg/dL — ABNORMAL HIGH (ref 70–99)
Potassium: 3.5 mmol/L (ref 3.5–5.1)
Sodium: 138 mmol/L (ref 135–145)

## 2024-08-31 LAB — HEMOGLOBIN A1C
Hgb A1c MFr Bld: 6.7 % — ABNORMAL HIGH (ref 4.8–5.6)
Mean Plasma Glucose: 145.59 mg/dL

## 2024-08-31 SURGERY — OPEN REDUCTION INTERNAL FIXATION (ORIF) DISTAL FEMUR FRACTURE
Anesthesia: General | Site: Leg Upper | Laterality: Right

## 2024-08-31 MED ORDER — PROPOFOL 10 MG/ML IV BOLUS
INTRAVENOUS | Status: DC | PRN
  Administered 2024-08-31: 120 mg via INTRAVENOUS

## 2024-08-31 MED ORDER — MENTHOL 3 MG MT LOZG
1.0000 | LOZENGE | OROMUCOSAL | Status: DC | PRN
  Administered 2024-09-03: 3 mg via ORAL
  Filled 2024-08-31: qty 9

## 2024-08-31 MED ORDER — ACETAMINOPHEN 10 MG/ML IV SOLN
INTRAVENOUS | Status: AC
  Filled 2024-08-31: qty 100

## 2024-08-31 MED ORDER — INSULIN ASPART 100 UNIT/ML IJ SOLN: 0.0000 [IU] | INTRAMUSCULAR | Status: DC | PRN

## 2024-08-31 MED ORDER — ONDANSETRON HCL 4 MG/2ML IJ SOLN
INTRAMUSCULAR | Status: DC | PRN
  Administered 2024-08-31: 4 mg via INTRAVENOUS

## 2024-08-31 MED ORDER — TRANEXAMIC ACID-NACL 1000-0.7 MG/100ML-% IV SOLN
1000.0000 mg | Freq: Once | INTRAVENOUS | Status: AC
  Administered 2024-08-31: 1000 mg via INTRAVENOUS
  Filled 2024-08-31: qty 100

## 2024-08-31 MED ORDER — LACTATED RINGERS IV SOLN: INTRAVENOUS | Status: DC

## 2024-08-31 MED ORDER — CEFAZOLIN SODIUM-DEXTROSE 2-4 GM/100ML-% IV SOLN
2.0000 g | Freq: Four times a day (QID) | INTRAVENOUS | Status: AC
  Administered 2024-08-31 (×2): 2 g via INTRAVENOUS
  Filled 2024-08-31 (×2): qty 100

## 2024-08-31 MED ORDER — FENTANYL CITRATE (PF) 100 MCG/2ML IJ SOLN
INTRAMUSCULAR | Status: AC
  Filled 2024-08-31: qty 2

## 2024-08-31 MED ORDER — PHENYLEPHRINE 80 MCG/ML (10ML) SYRINGE FOR IV PUSH (FOR BLOOD PRESSURE SUPPORT)
PREFILLED_SYRINGE | INTRAVENOUS | Status: DC | PRN
  Administered 2024-08-31 (×5): 160 ug via INTRAVENOUS

## 2024-08-31 MED ORDER — EPHEDRINE SULFATE-NACL 50-0.9 MG/10ML-% IV SOSY
PREFILLED_SYRINGE | INTRAVENOUS | Status: DC | PRN
  Administered 2024-08-31: 5 mg via INTRAVENOUS

## 2024-08-31 MED ORDER — OXYCODONE HCL 5 MG/5ML PO SOLN: 5.0000 mg | Freq: Once | ORAL | Status: DC | PRN

## 2024-08-31 MED ORDER — LIDOCAINE 2% (20 MG/ML) 5 ML SYRINGE
INTRAMUSCULAR | Status: DC | PRN
  Administered 2024-08-31: 40 mg via INTRAVENOUS

## 2024-08-31 MED ORDER — ACETAMINOPHEN 10 MG/ML IV SOLN: 1000.0000 mg | Freq: Once | INTRAVENOUS | Status: DC | PRN

## 2024-08-31 MED ORDER — MEPERIDINE HCL 25 MG/ML IJ SOLN: 6.2500 mg | INTRAMUSCULAR | Status: DC | PRN

## 2024-08-31 MED ORDER — OXYCODONE HCL 5 MG PO TABS: 5.0000 mg | ORAL_TABLET | Freq: Once | ORAL | Status: DC | PRN

## 2024-08-31 MED ORDER — ORAL CARE MOUTH RINSE: 15.0000 mL | Freq: Once | OROMUCOSAL | Status: AC

## 2024-08-31 MED ORDER — DOCUSATE SODIUM 100 MG PO CAPS
100.0000 mg | ORAL_CAPSULE | Freq: Two times a day (BID) | ORAL | Status: DC
  Administered 2024-08-31 – 2024-09-03 (×7): 100 mg via ORAL
  Filled 2024-08-31 (×7): qty 1

## 2024-08-31 MED ORDER — METHOCARBAMOL 1000 MG/10ML IJ SOLN: 500.0000 mg | Freq: Four times a day (QID) | INTRAMUSCULAR | Status: DC | PRN

## 2024-08-31 MED ORDER — ACETAMINOPHEN 10 MG/ML IV SOLN
INTRAVENOUS | Status: DC | PRN
  Administered 2024-08-31: 1000 mg via INTRAVENOUS

## 2024-08-31 MED ORDER — PROPOFOL 10 MG/ML IV BOLUS
INTRAVENOUS | Status: AC
Start: 2024-08-31 — End: 2024-08-31
  Filled 2024-08-31: qty 20

## 2024-08-31 MED ORDER — ACETAMINOPHEN 325 MG PO TABS
650.0000 mg | ORAL_TABLET | Freq: Four times a day (QID) | ORAL | Status: DC
  Administered 2024-08-31 – 2024-09-03 (×11): 650 mg via ORAL
  Filled 2024-08-31 (×13): qty 2

## 2024-08-31 MED ORDER — PHENOL 1.4 % MT LIQD: 1.0000 | OROMUCOSAL | Status: DC | PRN

## 2024-08-31 MED ORDER — PHENYLEPHRINE HCL-NACL 20-0.9 MG/250ML-% IV SOLN
INTRAVENOUS | Status: DC | PRN
  Administered 2024-08-31: 50 ug/min via INTRAVENOUS

## 2024-08-31 MED ORDER — FENTANYL CITRATE (PF) 250 MCG/5ML IJ SOLN
INTRAMUSCULAR | Status: DC | PRN
  Administered 2024-08-31: 50 ug via INTRAVENOUS
  Administered 2024-08-31: 25 ug via INTRAVENOUS
  Administered 2024-08-31: 50 ug via INTRAVENOUS

## 2024-08-31 MED ORDER — CLOPIDOGREL BISULFATE 75 MG PO TABS
75.0000 mg | ORAL_TABLET | Freq: Every day | ORAL | Status: DC
  Administered 2024-09-01 – 2024-09-03 (×3): 75 mg via ORAL
  Filled 2024-08-31 (×3): qty 1

## 2024-08-31 MED ORDER — 0.9 % SODIUM CHLORIDE (POUR BTL) OPTIME
TOPICAL | Status: DC | PRN
  Administered 2024-08-31: 1000 mL

## 2024-08-31 MED ORDER — HYDROMORPHONE HCL 1 MG/ML IJ SOLN
INTRAMUSCULAR | Status: AC
  Filled 2024-08-31: qty 1

## 2024-08-31 MED ORDER — CHLORHEXIDINE GLUCONATE 0.12 % MT SOLN
15.0000 mL | Freq: Once | OROMUCOSAL | Status: AC
  Administered 2024-08-31: 15 mL via OROMUCOSAL
  Filled 2024-08-31: qty 15

## 2024-08-31 MED ORDER — MIDAZOLAM HCL 2 MG/2ML IJ SOLN
INTRAMUSCULAR | Status: AC
  Filled 2024-08-31: qty 2

## 2024-08-31 MED ORDER — DROPERIDOL 2.5 MG/ML IJ SOLN: 0.6250 mg | Freq: Once | INTRAMUSCULAR | Status: DC | PRN

## 2024-08-31 MED ORDER — ALBUMIN HUMAN 5 % IV SOLN: INTRAVENOUS | Status: DC | PRN

## 2024-08-31 MED ORDER — ACETAMINOPHEN 650 MG RE SUPP: 650.0000 mg | Freq: Four times a day (QID) | RECTAL | Status: DC

## 2024-08-31 MED ORDER — HYDROMORPHONE HCL 1 MG/ML IJ SOLN
0.2500 mg | INTRAMUSCULAR | Status: DC | PRN
  Administered 2024-08-31: 0.25 mg via INTRAVENOUS

## 2024-08-31 MED ORDER — ROCURONIUM BROMIDE 10 MG/ML (PF) SYRINGE
PREFILLED_SYRINGE | INTRAVENOUS | Status: DC | PRN
  Administered 2024-08-31: 50 mg via INTRAVENOUS
  Administered 2024-08-31: 20 mg via INTRAVENOUS

## 2024-08-31 MED ORDER — ASPIRIN 81 MG PO TBEC
81.0000 mg | DELAYED_RELEASE_TABLET | Freq: Every day | ORAL | Status: DC
Start: 2024-08-31 — End: 2024-09-03
  Administered 2024-08-31 – 2024-09-03 (×4): 81 mg via ORAL
  Filled 2024-08-31 (×4): qty 1

## 2024-08-31 MED ORDER — SUGAMMADEX SODIUM 200 MG/2ML IV SOLN
INTRAVENOUS | Status: DC | PRN
  Administered 2024-08-31: 200 mg via INTRAVENOUS

## 2024-08-31 MED ORDER — METHOCARBAMOL 500 MG PO TABS
500.0000 mg | ORAL_TABLET | Freq: Four times a day (QID) | ORAL | Status: DC | PRN
  Filled 2024-08-31: qty 1

## 2024-08-31 SURGICAL SUPPLY — 50 items
BAG COUNTER SPONGE SURGICOUNT (BAG) ×1 IMPLANT
BIT DRILL CALIBRATED 4.3MMX365 (DRILL) IMPLANT
BIT DRILL CROWE PNT TWST 4.5MM (DRILL) IMPLANT
BLADE CLIPPER SURG (BLADE) IMPLANT
BNDG ELASTIC 4INX 5YD STR LF (GAUZE/BANDAGES/DRESSINGS) IMPLANT
BNDG ELASTIC 6INX 5YD STR LF (GAUZE/BANDAGES/DRESSINGS) IMPLANT
BRUSH SCRUB EZ 4% CHG (MISCELLANEOUS) ×1 IMPLANT
BRUSH SCRUB EZ PLAIN DRY (MISCELLANEOUS) ×2 IMPLANT
CANISTER SUCTION 3000ML PPV (SUCTIONS) ×1 IMPLANT
CNTNR URN SCR LID CUP LEK RST (MISCELLANEOUS) IMPLANT
COVER SURGICAL LIGHT HANDLE (MISCELLANEOUS) ×1 IMPLANT
DRAPE C-ARM 42X72 X-RAY (DRAPES) ×1 IMPLANT
DRAPE C-ARMOR (DRAPES) ×1 IMPLANT
DRAPE SURG ORHT 6 SPLT 77X108 (DRAPES) ×3 IMPLANT
DRAPE U-SHAPE 47X51 STRL (DRAPES) ×1 IMPLANT
DRESSING MEPILEX FLEX 4X4 (GAUZE/BANDAGES/DRESSINGS) IMPLANT
DRSG MEPITEL 4X7.2 (GAUZE/BANDAGES/DRESSINGS) IMPLANT
ELECTRODE REM PT RTRN 9FT ADLT (ELECTROSURGICAL) ×1 IMPLANT
GAUZE PAD ABD 8X10 STRL (GAUZE/BANDAGES/DRESSINGS) IMPLANT
GAUZE SPONGE 4X4 12PLY STRL (GAUZE/BANDAGES/DRESSINGS) IMPLANT
GLOVE BIOGEL PI IND STRL 7.5 (GLOVE) IMPLANT
GLOVE BIOGEL PI IND STRL 8 (GLOVE) ×1 IMPLANT
GOWN STRL REUS W/ TWL LRG LVL3 (GOWN DISPOSABLE) ×2 IMPLANT
GOWN STRL REUS W/ TWL XL LVL3 (GOWN DISPOSABLE) ×1 IMPLANT
GUIDEPIN VERSANAIL DSP 3.2X444 (ORTHOPEDIC DISPOSABLE SUPPLIES) IMPLANT
GUIDEWIRE BEAD TIP (WIRE) IMPLANT
KIT BASIN OR (CUSTOM PROCEDURE TRAY) ×1 IMPLANT
KIT TURNOVER KIT B (KITS) ×1 IMPLANT
NAIL FEM RETRO 10.5X400 (Nail) IMPLANT
PACK TOTAL JOINT (CUSTOM PROCEDURE TRAY) ×1 IMPLANT
PAD ARMBOARD POSITIONER FOAM (MISCELLANEOUS) ×2 IMPLANT
PAD CAST 4YDX4 CTTN HI CHSV (CAST SUPPLIES) IMPLANT
PADDING CAST COTTON 6X4 STRL (CAST SUPPLIES) IMPLANT
PENCIL BUTTON HOLSTER BLD 10FT (ELECTRODE) IMPLANT
SCREW CORT TI DBL LEAD 5X34 (Screw) IMPLANT
SCREW CORT TI DBL LEAD 5X38 (Screw) IMPLANT
SCREW CORT TI DBL LEAD 5X75 (Screw) IMPLANT
SCREW CORT TI DBL LEAD 5X85 (Screw) IMPLANT
SCREW CORT TI DBL LEAD 5X90 (Screw) IMPLANT
SOLN 0.9% NACL POUR BTL 1000ML (IV SOLUTION) ×1 IMPLANT
SOLN STERILE WATER BTL 1000 ML (IV SOLUTION) ×2 IMPLANT
STAPLER SKIN PROX 35W (STAPLE) ×1 IMPLANT
SUCTION TUBE FRAZIER 10FR DISP (SUCTIONS) ×1 IMPLANT
SUT ETHILON 2 0 PSLX (SUTURE) IMPLANT
SUT VIC AB 0 CT1 27XBRD ANBCTR (SUTURE) ×2 IMPLANT
SUT VIC AB 1 CT1 27XBRD ANBCTR (SUTURE) ×2 IMPLANT
SUT VIC AB 2-0 CT1 TAPERPNT 27 (SUTURE) ×2 IMPLANT
TOWEL GREEN STERILE (TOWEL DISPOSABLE) ×2 IMPLANT
TOWEL GREEN STERILE FF (TOWEL DISPOSABLE) ×1 IMPLANT
TRAY FOLEY MTR SLVR 16FR STAT (SET/KITS/TRAYS/PACK) IMPLANT

## 2024-08-31 NOTE — Anesthesia Procedure Notes (Signed)
 Procedure Name: Intubation Date/Time: 08/31/2024 8:41 AM  Performed by: Virgil Ee, CRNAPre-anesthesia Checklist: Patient identified, Patient being monitored, Timeout performed, Emergency Drugs available and Suction available Patient Re-evaluated:Patient Re-evaluated prior to induction Oxygen Delivery Method: Circle system utilized Preoxygenation: Pre-oxygenation with 100% oxygen Induction Type: IV induction Ventilation: Mask ventilation without difficulty Laryngoscope Size: Glidescope and 4 Grade View: Grade I Tube type: Oral Tube size: 7.5 mm Number of attempts: 1 Airway Equipment and Method: Stylet Placement Confirmation: ETT inserted through vocal cords under direct vision, positive ETCO2 and breath sounds checked- equal and bilateral Secured at: 23 cm Tube secured with: Tape Dental Injury: Teeth and Oropharynx as per pre-operative assessment

## 2024-08-31 NOTE — Anesthesia Postprocedure Evaluation (Signed)
 Anesthesia Post Note  Patient: Brendan [name]  Procedure(s) Performed: RETROGRADE INTRAMEDULLARY NAILING OF RIGHT FEMUR (Right: Leg Upper)     Patient location during evaluation: PACU Anesthesia Type: General Level of consciousness: awake and alert Pain management: pain level controlled Vital Signs Assessment: post-procedure vital signs reviewed and stable Respiratory status: spontaneous breathing, nonlabored ventilation, respiratory function stable and patient connected to nasal cannula oxygen Cardiovascular status: blood pressure returned to baseline and stable Postop Assessment: no apparent nausea or vomiting Anesthetic complications: no   No notable events documented.  Last Vitals:  Vitals:   08/31/24 1115 08/31/24 1131  BP: 102/71 110/70  Pulse: 87 85  Resp: 11 13  Temp: 36.9 C 36.8 C  SpO2: 98% 100%    Last Pain:  Vitals:   08/31/24 1131  TempSrc: Oral  PainSc:                  Kaylany Tesoriero D Aragon Scarantino

## 2024-08-31 NOTE — Op Note (Signed)
 08/31/2024  11:08 AM  PATIENT:  Brendan [name]  17-Oct-1961 male   MEDICAL RECORD NUMBER: [id number]  PREOPERATIVE DIAGNOSIS:  RIGHT PERIPROSTHETIC SUPRACONDYLAR PATHOLOGICAL FEMUR FRACTURE  POSTOPERATIVE DIAGNOSIS:   RIGHT PERIPROSTHETIC SUPRACONDYLAR PATHOLOGICAL FEMUR FRACTURE. STABLE KNEE.  PROCEDURES: 1.  RETROGRADE INTRAMEDULLARY NAILING OF THE RIGHT FEMUR with [employer] 10.5 x 400  mm statically locked nail. 2.  Intramedullary biopsy of the right femur.  SURGEON:  Ozell DEL. Celena, M.D.  ASSISTANT:  Francis Mt, PA-C.  ANESTHESIA:  General.  COMPLICATIONS:  None.  TOURNIQUET: None.  ESTIMATED BLOOD LOSS:  <100 mL.  DISPOSITION:  To PACU.  CONDITION:  Stable.  DELAY START OF DVT PROPHYLAXIS BECAUSE OF BLEEDING RISK: NO  BRIEF SUMMARY OF INDICATION FOR PROCEDURE:  Brendan [name] is a very pleasant 63 y.o. who sustained right supracondylar periprosthetic femur fracture walking down the stairs.  He did not fall but was able to catch himself and sit down.  He has a history of sarcoidosis with prior exacerbations requiring steroids among other interventions.  He does deny antecedent pain in that leg but recalls a few episodes of it giving way.  Plain films and CT scan suggest the possibility of a pathologic lesion within the canal adjacent to the fracture site.  I recommended retrograde nailing of the femur to facilitate early mobilization and range of motion. The risks and benefits of this operation were discussed with the patient including the possibilities of infection, nerve injury, vessel injury, DVT/ PE, loss of motion, arthritis, symptomatic hardware, and need for further surgery among others.  After full discussion, the patient gave consent to proceed.  BRIEF SUMMARY OF PROCEDURE:  After administration of preoperative antibiotics, the patient was taken to the operating room where general anesthesia was induced.  Careful positioning was performed with a  bump placed under the right hip, and control of the fracture with distraction during positioning and prepping to prevent neurovascular injury. Standard prep and drape was performed with chlorhexidine scrub and wash initially, then Betadine scrub and paint.  Time-out was held and then the radiolucent triangle and towel bumps used to gain length and sagittal alignment with traction and flexion of the knee.  A 2.5 cm incision was made at the base of the patellar tendon, extending proximally, followed with a medial parapatellar retinacular incision.  The sharp guide pin was placed in the middle of the femoral component and advanced on AP and lateral projections into the center-center position of the distal femur. My assistant continued traction while I fine tuned the bumps.  The starting reamer was then used distally while protecting the soft tissues.  I was able to introduce a pituitary rondure up to the level of the lesion and grasp some of this material which had a distinctly different feel to it compared to normal marrow being of greater density with a somewhat rubbery texture and a darker hue.  This was followed by introduction of the ball-tipped guidewire across the fracture site into the proximal femur up close to the piriformis.  There was considerable fibrous tissue within the joint itself and this required removal with a rondure in order to get the measuring device opposed to the distal femur.  Nail length was measured. Sequential reaming followed while maintaining reduction up to 11.5 mm, but we never encountering chatter, then placing a 10.5 x 400 mm nail.  After confirming appropriate seating of the nail on the lateral, three locking screws were placed distally off the guide, and  checked on orthogonal images to confirm placement within the nail and appropriate length. Two proximal locks were then placed in static mode using perfect circle technique and a captured screwdriver.  These screws were also  confirmed for length and position. My assistant, Francis Mt, PA-C, was present and assisting throughout.  We then irrigated all wounds thoroughly and closed them in standard layered fashion, working simultaneously to reduce overall time in the OR. The wounds were dressed and a gently compressive wrap from the ankle to thigh was applied.  The patient was awakened and taken to the PACU in stable condition.    PROGNOSIS:  The patient will have unrestricted range of motion of the knee and hip, and early mobilization will be encouraged with touchdown weight bearing.  DVT prophylaxis will be with covered with resumption of his Plavix  and aspirin .  We will follow-up on his intramedullary biopsy and make sure that the physician managing his sarcoidosis is aware. Metabolic bone workup is ongoing. Given the patient's comorbidities the risk of complications remains elevated which has been discussed with the family.    Ozell DEL. Celena, M.D.

## 2024-08-31 NOTE — Progress Notes (Signed)
 PROGRESS NOTE Brendan [name]  FMW:[id number] DOB: 1961-01-12 DOA: 08/30/2024 PCP: [name] LABOR, PA-C  Brief Narrative/Hospital Course: Brendan [name] is a 63 y.o. male with PMH of with PMH of  CVA on plavix , history of sarcoidosis, diabetes mellitus, left knee replacement, hyperlipidemia, hypertension, diabetes mellitus on insulin  presented after hearing a pop on the right knee while stepping down and not able to stop himself from falling, sat down for a while and tried to get up but felt like his knee was unstable and did not put any weight and brought to the ED. In the ED hemodynamically stable, labs with creatinine 1.5, x-ray showed right periprosthetic distal femur fracture and orthopedic was consulted and admission requested under medicine service for further management.  Subjective: Seen and examined in PACU Slightly groggy waking up Denies any pain Patient went to surgery this morning Labs remained stable creat 1.6  Assessment and plan:  Right periprosthetic distal femur fracture History of right knee replacement: Orthopedic input appreciated.  S/P IM nailing of the right femur 11/4 Continue pain management DVT prophylaxis-with resumption of aspirin  and Plavix  PT OT postop as per orthopedics-okay for unrestricted range of motion of the knee and hip early mobilization and touchdown weightbearing on right leg.  Recent PFO closure CVA on plavix /aspirin : Cardiology input appreciated.  Okay to start low-dose Lopressor aspirin  81 and Plavix  postoperatively with no load.  CKD stage IIIa: Baseline creatinine around 1.5, stable.  Continue to monitor  history of sarcoidosis Stable.  Diabetes mellitus on insulin : Blood sugar well-controlled -on sliding scale insulin  for now resume long-acting insulin  sliding    Hyperlipidemia Cont statins  Hypertension: BP stable-continue Coreg and amlodipine  DVT prophylaxis: SCDs Start: 08/31/24 1134 aspirin /Plavix  Code Status:    Code Status: Full Code Family Communication: plan of care discussed with patient/ at bedside. Patient status is: Remains hospitalized because of severity of illness Level of care: Med-Surg   Dispo: The patient is from: home             Anticipated disposition: TBD Objective: Vitals last 24 hrs: Vitals:   08/31/24 1057 08/31/24 1100 08/31/24 1115 08/31/24 1131  BP: 107/69 102/72 102/71 110/70  Pulse: 93 91 87 85  Resp: 12 16 11 13   Temp:   98.4 F (36.9 C) 98.3 F (36.8 C)  TempSrc:    Oral  SpO2: 97% 96% 98% 100%  Weight:      Height:        Physical Examination: General exam: alert awake, oriented, older than stated age HEENT:Oral mucosa moist, Ear/Nose WNL grossly Respiratory system: Bilaterally clear BS,no use of accessory muscle Cardiovascular system: S1 & S2 +, No JVD. Gastrointestinal system: Abdomen soft,NT,ND, BS+ Nervous System: Alert, awake, moving all extremities,and following commands. Extremities: extremities warm, leg edema neg, right lower extremity with postop dressing in place distal toes and foot warm and able to move Skin: Warm, no rashes MSK: Normal muscle bulk,tone, power   Medications reviewed:  Scheduled Meds:  acetaminophen   650 mg Oral Q6H   Or   acetaminophen   650 mg Rectal Q6H   amLODipine  10 mg Oral Daily   aspirin  EC  81 mg Oral Daily   atorvastatin   80 mg Oral QHS   carvedilol  6.25 mg Oral BID WC   docusate sodium  100 mg Oral BID   fluticasone  furoate-vilanterol  1 puff Inhalation Daily   insulin  aspart  0-5 Units Subcutaneous QHS   insulin  aspart  0-9 Units Subcutaneous TID  WC   pantoprazole   40 mg Oral Daily   sodium chloride  flush  3 mL Intravenous Q12H   Continuous Infusions:   ceFAZolin (ANCEF) IV     tranexamic acid     Diet: Diet Order             Diet Carb Modified Fluid consistency: Thin; Room service appropriate? Yes  Diet effective now                    Data Reviewed: I have personally reviewed following  labs and imaging studies ( see epic result tab) CBC: Recent Labs  Lab 08/30/24 1334  WBC 8.3  HGB 13.8  HCT 42.3  MCV 91.8  PLT 188   CMP: Recent Labs  Lab 08/30/24 1334 08/31/24 0453  NA 140 138  K 4.0 3.5  CL 104 105  CO2 25 23  GLUCOSE 127* 149*  BUN 12 15  CREATININE 1.55* 1.69*  CALCIUM  9.6 8.9   GFR: Estimated Creatinine Clearance: 51.6 mL/min (A) (by C-G formula based on SCr of 1.69 mg/dL (H)). No results for input(s): AST, ALT, ALKPHOS, BILITOT, PROT, ALBUMIN in the last 168 hours. No results for input(s): LIPASE, AMYLASE in the last 168 hours. No results for input(s): AMMONIA in the last 168 hours. Coagulation Profile: No results for input(s): INR, PROTIME in the last 168 hours. Unresulted Labs (From admission, onward)     Start     Ordered   09/01/24 0500  CBC  Daily,   R     Comments: For 3 days.    08/31/24 1133   09/01/24 0500  Basic metabolic panel  Daily,   R     Comments: For 2 days .    08/31/24 1133   09/01/24 0500  VITAMIN D  25 Hydroxy (Vit-D Deficiency, Fractures)  Tomorrow morning,   R        08/31/24 1133           Antimicrobials/Microbiology: Anti-infectives (From admission, onward)    Start     Dose/Rate Route Frequency Ordered Stop   08/31/24 1230  ceFAZolin (ANCEF) IVPB 2g/100 mL premix        2 g 200 mL/hr over 30 Minutes Intravenous Every 6 hours 08/31/24 1133 09/01/24 0029   08/31/24 0745  ceFAZolin (ANCEF) IVPB 2g/100 mL premix        2 g 200 mL/hr over 30 Minutes Intravenous To [facility] 08/30/24 1912 08/31/24 0913      No results found for: SDES, SPECREQUEST, CULT, REPTSTATUS  Procedures: Procedure(s) (LRB): RETROGRADE INTRAMEDULLARY NAILING OF RIGHT FEMUR (Right)   Mennie LAMY, MD [facility] 08/31/2024, 11:41 AM

## 2024-08-31 NOTE — Progress Notes (Signed)
 No changes overnight. CT scan completed.  The risks and benefits of right femur repair, most likely with nailing, were discussed with the patient, including the possibility of infection, nerve injury, vessel injury, wound breakdown, arthritis, symptomatic hardware, DVT/ PE, loss of motion, malunion, nonunion, and need for further surgery among others.  We also specifically discussed the possibility of a pathologic fracture and obtainining a specifimen.  These risks were acknowledged and consent was provided to proceed.  Ozell Bruch, MD [facility], [facility] [phone]

## 2024-08-31 NOTE — Anesthesia Preprocedure Evaluation (Addendum)
 Anesthesia Evaluation  Patient identified by MRN, date of birth, ID band Patient awake    Reviewed: Allergy & Precautions, NPO status , Patient's Chart, lab work & pertinent test results  Airway Mallampati: II  TM Distance: >3 FB Neck ROM: Full    Dental  (+) Partial Upper   Pulmonary neg pulmonary ROS   breath sounds clear to auscultation       Cardiovascular hypertension, Pt. on medications + CAD   Rhythm:Regular Rate:Normal     Neuro/Psych CVA  negative psych ROS   GI/Hepatic Neg liver ROS,GERD  Medicated,,  Endo/Other  diabetes    Renal/GU Renal disease     Musculoskeletal negative musculoskeletal ROS (+)    Abdominal   Peds  Hematology negative hematology ROS (+)   Anesthesia Other Findings   Reproductive/Obstetrics                              Anesthesia Physical Anesthesia Plan  ASA: 3  Anesthesia Plan: General   Post-op Pain Management: Tylenol  PO (pre-op)*   Induction:   PONV Risk Score and Plan: 3 and Ondansetron , Dexamethasone and Midazolam   Airway Management Planned: Oral ETT  Additional Equipment: None  Intra-op Plan:   Post-operative Plan: Extubation in OR  Informed Consent: I have reviewed the patients History and Physical, chart, labs and discussed the procedure including the risks, benefits and alternatives for the proposed anesthesia with the patient or authorized representative who has indicated his/her understanding and acceptance.     Dental advisory given  Plan Discussed with: CRNA  Anesthesia Plan Comments:          Anesthesia Quick Evaluation

## 2024-08-31 NOTE — Transfer of Care (Signed)
 Immediate Anesthesia Transfer of Care Note  Patient: Brendan [name]  Procedure(s) Performed: RETROGRADE INTRAMEDULLARY NAILING OF RIGHT FEMUR (Right: Leg Upper)  Patient Location: PACU  Anesthesia Type:General  Level of Consciousness: awake  Airway & Oxygen Therapy: Patient Spontanous Breathing  Post-op Assessment: Report given to RN and Post -op Vital signs reviewed and stable  Post vital signs: Reviewed and stable  Last Vitals:  Vitals Value Taken Time  BP 98/65 08/31/24 10:45  Temp 36.9 C 08/31/24 10:41  Pulse 99 08/31/24 10:46  Resp 19 08/31/24 10:46  SpO2 94 % 08/31/24 10:46  Vitals shown include unfiled device data.  Last Pain:  Vitals:   08/31/24 0727  TempSrc: Oral  PainSc: 2          Complications: No notable events documented.

## 2024-09-01 ENCOUNTER — Encounter (HOSPITAL_COMMUNITY): Payer: Self-pay | Admitting: Orthopedic Surgery

## 2024-09-01 DIAGNOSIS — S72451A Displaced supracondylar fracture without intracondylar extension of lower end of right femur, initial encounter for closed fracture: Secondary | ICD-10-CM | POA: Diagnosis present

## 2024-09-01 DIAGNOSIS — Z8774 Personal history of (corrected) congenital malformations of heart and circulatory system: Secondary | ICD-10-CM | POA: Diagnosis not present

## 2024-09-01 DIAGNOSIS — I2510 Atherosclerotic heart disease of native coronary artery without angina pectoris: Secondary | ICD-10-CM | POA: Diagnosis not present

## 2024-09-01 LAB — BASIC METABOLIC PANEL WITH GFR
Anion gap: 12 (ref 5–15)
BUN: 15 mg/dL (ref 8–23)
CO2: 24 mmol/L (ref 22–32)
Calcium: 8.7 mg/dL — ABNORMAL LOW (ref 8.9–10.3)
Chloride: 101 mmol/L (ref 98–111)
Creatinine, Ser: 1.77 mg/dL — ABNORMAL HIGH (ref 0.61–1.24)
GFR, Estimated: 43 mL/min — ABNORMAL LOW (ref >60)
Glucose, Bld: 129 mg/dL — ABNORMAL HIGH (ref 70–99)
Potassium: 3.8 mmol/L (ref 3.5–5.1)
Sodium: 137 mmol/L (ref 135–145)

## 2024-09-01 LAB — CBC
HCT: 33.6 % — ABNORMAL LOW (ref 39.0–52.0)
Hemoglobin: 11 g/dL — ABNORMAL LOW (ref 13.0–17.0)
MCH: 30.2 pg (ref 26.0–34.0)
MCHC: 32.7 g/dL (ref 30.0–36.0)
MCV: 92.3 fL (ref 80.0–100.0)
Platelets: 114 K/uL — ABNORMAL LOW (ref 150–400)
RBC: 3.64 MIL/uL — ABNORMAL LOW (ref 4.22–5.81)
RDW: 13.9 % (ref 11.5–15.5)
WBC: 9.4 K/uL (ref 4.0–10.5)
nRBC: 0 % (ref 0.0–0.2)

## 2024-09-01 LAB — GLUCOSE, CAPILLARY
Glucose-Capillary: 128 mg/dL — ABNORMAL HIGH (ref 70–99)
Glucose-Capillary: 129 mg/dL — ABNORMAL HIGH (ref 70–99)
Glucose-Capillary: 151 mg/dL — ABNORMAL HIGH (ref 70–99)
Glucose-Capillary: 151 mg/dL — ABNORMAL HIGH (ref 70–99)

## 2024-09-01 LAB — TSH: TSH: 0.75 u[IU]/mL (ref 0.350–4.500)

## 2024-09-01 LAB — VITAMIN D 25 HYDROXY (VIT D DEFICIENCY, FRACTURES): Vit D, 25-Hydroxy: 69.54 ng/mL (ref 30–100)

## 2024-09-01 MED ORDER — GUAIFENESIN-DM 100-10 MG/5ML PO SYRP
5.0000 mL | ORAL_SOLUTION | ORAL | Status: DC | PRN
  Administered 2024-09-01 – 2024-09-02 (×2): 5 mL via ORAL
  Filled 2024-09-01 (×2): qty 10

## 2024-09-01 MED ORDER — SODIUM CHLORIDE 0.9 % IV BOLUS
1000.0000 mL | Freq: Once | INTRAVENOUS | Status: AC
  Administered 2024-09-01: 1000 mL via INTRAVENOUS

## 2024-09-01 NOTE — Plan of Care (Signed)
  Problem: Pain Managment: Goal: General experience of comfort will improve and/or be controlled Outcome: Progressing   Problem: Safety: Goal: Ability to remain free from injury will improve Outcome: Progressing   Problem: Clinical Measurements: Goal: Will remain free from infection Outcome: Progressing

## 2024-09-01 NOTE — Evaluation (Signed)
 Physical Therapy Evaluation Patient Details Name: Brendan [name] MRN: [id number] DOB: 13-May-1961 Today's Date: 09/01/2024  History of Present Illness  63 y.o. male presents to [facility] 08/30/24 after R leg gave out during ambulation and popping noise was heard. X-ray showed right periprosthetic distal femur fracture. S/p IM nailing of R femur 11/4. 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   Clinical Impression  Pt admitted with above diagnosis. PTA, pt was independent with functional mobility, ADLs/IADLs, and driving. He lives with his wife in a one story house with 2 STE. Pt currently with functional limitations due to the deficits listed below (see PT Problem List). He required CGA-minA for sit<>stand using crutches and CGA-minA x2 for short-distance gait using crutches. Examination limited by symptomatic orthostatic hypotension. During the transition for sitting to standing his SBP dropped 23 mmHg and his DBP dropped 22 mmHg and pt c/o lightheadedness. Pt is currently limited by weight bearing status (RLE TDWB), decreased balance, and impaired activity tolerance. Pt will benefit from acute skilled PT to increase his independence and safety with mobility to allow discharge. Recommend HHPT to increased strength, improve balance, decrease fall risk, and optimize safety within the home environment.      If plan is discharge home, recommend the following: A little help with walking and/or transfers;A little help with bathing/dressing/bathroom;Assistance with cooking/housework;Assist for transportation;Help with stairs or ramp for entrance   Can travel by private vehicle        Equipment Recommendations Wheelchair (measurements PT);Wheelchair cushion (measurements PT);Rolling walker (2 wheels);Crutches;BSC/3in1  Recommendations for Other Services       Functional Status Assessment  Patient has had a recent decline in their functional status and demonstrates the ability to make significant improvements in function in a reasonable and predictable amount of time.     Precautions / Restrictions Precautions Precautions: Fall Recall of Precautions/Restrictions: Intact Precaution/Restrictions Comments: Unrestricted ROM R hip and knee Restrictions Weight Bearing Restrictions Per Provider Order: Yes RLE Weight Bearing Per Provider Order: Touchdown weight bearing      Mobility  Bed Mobility               General bed mobility comments: Not assessed. Pt greeted seated in recliner chair and returned there at end of session.    Transfers Overall transfer level: Needs assistance Equipment used: Crutches Transfers: Sit to/from Stand Sit to Stand: Contact guard assist, Min assist           General transfer comment: Introduced crutches, educated pt on proper and safe use of AD as well as sequencing. He reports previously utilizing crutches. Pt powered up from recliner chair with B crutches in L hand pushing up from arm rest and hand grips. He took increased time to rise and minA to stabilize as he brought B crutches under arm pits. Pt allowed flat foot contact on RLE despite cues for only toe touching. He appeared to limit weight-acceptance and reported it was less than what could crush an egg. Good eccentric control, reaching back to arm rest.    Ambulation/Gait Ambulation/Gait assistance: Contact guard assist, Min assist, +2 safety/equipment (Chair Follow) Gait Distance (Feet): 6 Feet Assistive device: Crutches Gait Pattern/deviations: Step-to pattern, Decreased stride length       General Gait Details: Demonstrated proper technique using crutches while adhereing to WBing precautions. Pt utilized a swing-to pattern advancing LLE slowly and maintained RLE NWB and held anterior. Pt c/o  dizziness. Facilitated seated rest and assessed vitals. Deferred further mobiltiy  attempts d/t symptomatic orthostatic hypotension.  Stairs            Wheelchair Mobility     Tilt Bed    Modified Rankin (Stroke Patients Only)       Balance Overall balance assessment: Needs assistance Sitting-balance support: Feet supported, Bilateral upper extremity supported Sitting balance-Leahy Scale: Fair Sitting balance - Comments: Pt scooted fwd/bkwd using arm rests of recliner chair.   Standing balance support: Bilateral upper extremity supported, During functional activity, Reliant on assistive device for balance Standing balance-Leahy Scale: Poor Standing balance comment: Pt dependent on AD and external support of crutches.                             Pertinent Vitals/Pain Pain Assessment Pain Assessment: No/denies pain    Home Living Family/patient expects to be discharged to:: Private residence Living Arrangements: Spouse/significant other Available Help at Discharge: Family;Available 24 hours/day Type of Home: House Home Access: Stairs to enter Entrance Stairs-Rails: Right;Left (Pt reports the railings are shaky/wobbly) Entrance Stairs-Number of Steps: 2   Home Layout: One level Home Equipment: Cane - single point      Prior Function Prior Level of Function : Independent/Modified Independent;Driving;History of Falls (last six months)             Mobility Comments: Ambulates without AD. 1 previous fall walking off deck, same way he fell leading to this admission ADLs Comments: Indep     Extremity/Trunk Assessment   Upper Extremity Assessment Upper Extremity Assessment: Defer to OT evaluation    Lower Extremity Assessment Lower Extremity Assessment: RLE deficits/detail RLE Deficits / Details: Pt POD 1 s/p IM nail of R femur. Decreased AROM hip and knee. Ankle WFL. Grossly 3/5 strength. RLE Sensation: decreased proprioception RLE Coordination: decreased gross motor    Cervical / Trunk Assessment Cervical / Trunk  Assessment: Normal  Communication   Communication Communication: No apparent difficulties    Cognition Arousal: Alert Behavior During Therapy: WFL for tasks assessed/performed   PT - Cognitive impairments: No apparent impairments                       PT - Cognition Comments: Pt A,Ox4 Following commands: Intact       Cueing Cueing Techniques: Verbal cues     General Comments General comments (skin integrity, edema, etc.): Pt c/o lightheadedness during ambulation. Facilitated seatd rest and cued PLB. BP seated after gait 90/70. Pt denied dizziness. Upon standing statically pt c/o lightheadness, BP 67/48. Returned pt to recliner chair with feet elevated BP 110/68.    Exercises     Assessment/Plan    PT Assessment Patient needs continued PT services  PT Problem List Decreased strength;Decreased activity tolerance;Decreased balance;Decreased mobility;Decreased knowledge of use of DME;Decreased safety awareness;Decreased knowledge of precautions       PT Treatment Interventions DME instruction;Gait training;Stair training;Functional mobility training;Therapeutic activities;Therapeutic exercise;Balance training;Patient/family education    PT Goals (Current goals can be found in the Care Plan section)  Acute Rehab PT Goals Patient Stated Goal: Return Home and regain independence PT Goal Formulation: With patient/family Time For Goal Achievement: 09/15/24 Potential to Achieve Goals: Good    Frequency Min 2X/week     Co-evaluation               AM-PAC PT 6 Clicks Mobility  Outcome Measure Help needed turning from your back  to your side while in a flat bed without using bedrails?: A Lot Help needed moving from lying on your back to sitting on the side of a flat bed without using bedrails?: A Lot Help needed moving to and from a bed to a chair (including a wheelchair)?: A Little Help needed standing up from a chair using your arms (e.g., wheelchair or bedside  chair)?: A Little Help needed to walk in hospital room?: A Little Help needed climbing 3-5 steps with a railing? : A Lot 6 Click Score: 15    End of Session Equipment Utilized During Treatment: Gait belt Activity Tolerance: Treatment limited secondary to medical complications (Comment) (symptomatic orthostatic hypotension) Patient left: in chair;with call bell/phone within reach;with family/visitor present Nurse Communication: Mobility status PT Visit Diagnosis: Difficulty in walking, not elsewhere classified (R26.2);Other abnormalities of gait and mobility (R26.89);Unsteadiness on feet (R26.81)    Time: 8656-8597 PT Time Calculation (min) (ACUTE ONLY): 19 min   Charges:   PT Evaluation $PT Eval Moderate Complexity: 1 Mod   PT General Charges $$ ACUTE PT VISIT: 1 Visit         Randall SAUNDERS, PT, DPT Acute Rehabilitation Services Office: [phone] Secure Chat Preferred  Delon CHRISTELLA Callander 09/01/2024, 2:46 PM

## 2024-09-01 NOTE — TOC Initial Note (Signed)
 Transition of Care [facility]) - Initial/Assessment Note    Patient Details  Name: Brendan [name] MRN: [id number] Date of Birth: 1961/03/29  [facility] of [facility]) CM/SW Contact:    Rosalva Jon Bloch, RN Phone Number: 09/01/2024, 6:51 PM  Clinical Narrative:                    - S/p IM nailing of R femur 11/4 NCM spoke with pt @ bedside regarding d/c planning. Pt is from home with wife. PTA independent with ADL's, no DME usage. Pt agreeable to home health services and DME needs. Pt without provider preference.  Home health PT referral made with [facility] and accepted pending MD's orders. DME : RW , [facility] referral made acceptance pending. Pt without RX med concerns or transportation issues.  IP CM following and will continue assisting with needs.  Expected Discharge Plan: Home w Home Health Services Barriers to Discharge: Continued Medical Work up   Patient Goals and CMS Choice     Choice offered to / list presented to : Patient (Pt without preference for home health and DME needs)      Expected Discharge Plan and Services   Discharge Planning Services: CM Consult                               HH Arranged: PT HH Agency: [facility] Date [facility] Agency Contacted: 09/01/24 Time HH Agency Contacted: 1850 Representative spoke with at [facility] Agency: [place]  Prior Living Arrangements/Services   Lives with:: Spouse Patient language and need for interpreter reviewed:: Yes Do you feel safe going back to the place where you live?: Yes      Need for Family Participation in Patient Care: Yes (Comment) Care giver support system in place?: Yes (comment)   Criminal Activity/Legal Involvement Pertinent to Current Situation/Hospitalization: No - Comment as needed  Activities of Daily Living      Permission Sought/Granted                  Emotional Assessment       Orientation: : Oriented to Self, Oriented to Place, Oriented to  Time, Oriented to  Situation Alcohol / Substance Use: Not Applicable Psych Involvement: No (comment)  Admission diagnosis:  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 [M97.11XA] Closed displaced comminuted fracture of shaft of right femur, initial encounter (HCC) [S72.351A] Patient Active Problem List   Diagnosis Date Noted   Displaced supracondylar fracture of distal end of right femur without intracondylar extension (HCC) 09/01/2024   Coronary artery disease involving native coronary artery of native heart without angina pectoris 08/31/2024   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 [place] [address] Phone: [phone] Fax: [phone]     Social Drivers of Health (SDOH) Social History: SDOH Screenings   Food Insecurity: No Food Insecurity (08/31/2024)  Housing: Low Risk  (08/31/2024)  Transportation Needs: No Transportation Needs (08/31/2024)  Utilities: Not At Risk (08/31/2024)  Depression (PHQ2-9): Low Risk  (04/02/2023)  Recent Concern: Depression (PHQ2-9) - High Risk (01/07/2023)  Financial Resource Strain: Low Risk  (03/24/2024)   Received from [facility]  Social Connections: Unknown (02/27/2022)   Received from [facility]  Tobacco Use: Low Risk  (08/31/2024)   SDOH Interventions:     Readmission Risk Interventions     No data to display

## 2024-09-01 NOTE — Progress Notes (Signed)
 Orthopedic Tech Progress Note Patient Details:  Brendan [name] 09/15/1961 [id number]   Ortho Devices Type of Ortho Device: Bone foam zero knee Ortho Device/Splint Location: RLE Ortho Device/Splint Interventions: Ordered   Post Interventions Patient Tolerated: Well Instructions Provided: Care of device  Brendan [name] [name] 09/01/2024, 11:42 AM

## 2024-09-01 NOTE — Progress Notes (Signed)
 Cardiologist:  Jordan  Subjective:  Denies SSCP, palpitations or Dyspnea Post op Day one   Objective:  Vitals:   09/01/24 0120 09/01/24 0425 09/01/24 0630 09/01/24 0811  BP: 106/66 96/65    Pulse: (!) 106 96  96  Resp: 18 18  18   Temp: 98.7 F (37.1 C) 98.8 F (37.1 C)    TempSrc: Oral Oral    SpO2: 95% 95% 98% 98%  Weight:      Height:        Intake/Output from previous day:  Intake/Output Summary (Last 24 hours) at 09/01/2024 0835 Last data filed at 09/01/2024 0747 Gross per 24 hour  Intake 1800 ml  Output 725 ml  Net 1075 ml    Physical Exam:  Black male in no distress  Lungs clear SEM AV sclerosis murmur  Abdomen benign Post right femur fracture repair with prior TKR   Lab Results: Basic Metabolic Panel: Recent Labs    08/31/24 0453 09/01/24 0323  NA 138 137  K 3.5 3.8  CL 105 101  CO2 23 24  GLUCOSE 149* 129*  BUN 15 15  CREATININE 1.69* 1.77*  CALCIUM  8.9 8.7*    CBC: Recent Labs    08/30/24 1334 09/01/24 0323  WBC 8.3 9.4  HGB 13.8 11.0*  HCT 42.3 33.6*  MCV 91.8 92.3  PLT 188 114*    Hemoglobin A1C: Recent Labs    08/31/24 0453  HGBA1C 6.7*    Thyroid  Function Tests: Recent Labs    09/01/24 0323  TSH 0.750     Imaging: DG FEMUR PORT, MIN 2 VIEWS RIGHT Result Date: 08/31/2024 EXAM: 2 VIEW(S) XRAY OF THE RIGHT FEMUR 08/31/2024 12:11:01 PM COMPARISON: Radiographs 08/30/2024 previous right knee replacement. CLINICAL HISTORY: 03948 Fracture [id number] Fracture [id number]. FINDINGS: BONES AND JOINTS: Interval intramedullary rodding of the right femur with proximal and distal screw fixation. Acute comminuted distal femoral periprosthetic fracture with mild residual displacement. Previous right knee replacement. No joint dislocation. SOFT TISSUES: Vascular calcifications. Gas in the soft tissues consistent with recent surgery. IMPRESSION: 1. Status post intramedullary rodding of the right femur for comminuted distal femoral periprosthetic  fracture. Electronically signed by: Luke Bun MD 08/31/2024 06:17 PM EST RP Workstation: [id number]   DG FEMUR, MIN 2 VIEWS RIGHT Result Date: 08/31/2024 EXAM: Multiple low-resolution intraoperative spot views of the right femur. 08/31/2024 10:11:00 AM COMPARISON: CT 08/30/2024, radiograph 08/30/2024. CLINICAL HISTORY: 461500 Elective surgery 461500 Elective surgery [phone]. FINDINGS: BONES AND JOINTS: Previous right knee replacement. Intraoperative intramedullary rodding and screw fixation of comminuted distal femoral periprosthetic fracture. Total fluoroscopy time was 1 minute 27 seconds, fluoroscopic dose of 16.12 mgy. IMPRESSION: 1. Intraoperative fluoroscopic assistance provided during surgical fixation of distal femoral fracture Electronically signed by: Luke Bun MD 08/31/2024 06:15 PM EST RP Workstation: [id number]   DG HIP UNILAT WITH PELVIS 2-3 VIEWS RIGHT Result Date: 08/31/2024 EXAM: 2 OR MORE VIEW(S) XRAY OF THE UNILATERAL HIP 08/30/2024 07:57:00 PM COMPARISON: None available. CLINICAL HISTORY: Fracture [id number] FINDINGS: BONES AND JOINTS: No acute fracture or focal osseous lesion. The hip joint is maintained. No significant degenerative changes. SOFT TISSUES: Scrotal and pelvic phleboliths and vascular calcifications. IMPRESSION: 1. No acute osseous abnormality of the hip. Electronically signed by: Rockey Kilts MD 08/31/2024 02:50 PM EST RP Workstation: [id number]   DG C-Arm 1-60 Min-No Report Result Date: 08/31/2024 Fluoroscopy was utilized by the requesting physician.  No radiographic interpretation.   DG C-Arm 1-60 Min-No Report Result Date: 08/31/2024 Fluoroscopy was utilized by the requesting  physician.  No radiographic interpretation.   CT KNEE RIGHT WO CONTRAST Result Date: 08/30/2024 CLINICAL DATA:  Injury, right periprosthetic femoral fracture EXAM: CT OF THE RIGHT KNEE WITHOUT CONTRAST TECHNIQUE: Multidetector CT imaging of the right knee was performed according to the  standard protocol. Multiplanar CT image reconstructions were also generated. RADIATION DOSE REDUCTION: This exam was performed according to the departmental dose-optimization program which includes automated exposure control, adjustment of the mA and/or kV according to patient size and/or use of iterative reconstruction technique. COMPARISON:  08/30/2024 FINDINGS: Bones/Joint/Cartilage There is a comminuted impacted fracture of the distal right femoral metaphysis abutting the femoral component of the right knee arthroplasty. There is slight ventral angulation at the fracture site. Otherwise alignment is near anatomic. No other acute displaced fractures are identified. The prostheses components appear in the expected location without evidence of hardware failure. Small right knee effusion. Ligaments Suboptimally assessed by CT. Muscles and Tendons No evidence of acute muscular injury. Intramuscular edema is seen within the quadriceps just proximal to the knee. Soft tissues Marked edema in the soft tissues surrounding the distal femoral fracture. No fluid collection or hematoma. Atherosclerosis of the right superficial femoral and popliteal arteries. Reconstructed images demonstrate no additional findings. IMPRESSION: 1. Comminuted periprosthetic distal right femoral metaphyseal fracture, with slight impaction and ventral angulation at the fracture site. 2. Small joint effusion. 3. Soft tissue swelling surrounding the right femoral fracture. No fluid collection or hematoma. Electronically Signed   By: Ozell Daring M.D.   On: 08/30/2024 20:02   DG Knee Complete 4 Views Right Result Date: 08/30/2024 CLINICAL DATA:  injury. EXAM: RIGHT KNEE - COMPLETE 4+ VIEW COMPARISON:  None Available. FINDINGS: No acute fracture or dislocation. No aggressive osseous lesion. Redemonstration of right total knee arthroplasty with patellar resurfacing. There is comminuted fracture of the distal right femur just above the femoral  implant. There is associated small left suprapatellar knee joint effusion. No focal soft tissue swelling. No radiopaque foreign bodies. IMPRESSION: *Comminuted fracture of the distal right femur just above the femoral implant. Electronically Signed   By: Ree Molt M.D.   On: 08/30/2024 13:52    Cardiac Studies:  ECG: SR LAD no acute ST changes    Telemetry:  NSR   Echo: 08/05/24 EF 55-60% PFO closure intact AV sclerosis   Medications:    acetaminophen   650 mg Oral Q6H   Or   acetaminophen   650 mg Rectal Q6H   amLODipine  10 mg Oral Daily   aspirin  EC  81 mg Oral Daily   atorvastatin   80 mg Oral QHS   carvedilol  6.25 mg Oral BID WC   clopidogrel   75 mg Oral Daily   docusate sodium  100 mg Oral BID   fluticasone  furoate-vilanterol  1 puff Inhalation Daily   insulin  aspart  0-5 Units Subcutaneous QHS   insulin  aspart  0-9 Units Subcutaneous TID WC   pantoprazole   40 mg Oral Daily   sodium chloride  flush  3 mL Intravenous Q12H      Assessment/Plan:  63 y.o. with history of bilateral TKR;s Had a mechanical fall with Right comminuted fracture of the distal right femur just above the femoral implant. He has a history of DM, HLD, HTN, Sarcoid and CAD. CAD has been stable with no angina Cath 08/01/22 with 70% mid LAD and 75% mid LCX dx. Moderate main 2 vessel dx with severe stenosis in two small diagonal branches not amenable to PCI  Prior to cath 07/25/22  he had non ischemic myovue.   CAD:  no angina continue ASA, coreg plavix  and statin PFO: recent closure 07/2024 no residual shunt on echo ASA/plavix  HLD:  continue statin HTN:  continue norvasc Fracture:  ? Pathologic. Tissue sent during operation. He does not have a local primary care doctor. Consider checking myeloma panel. Should be set up to see internal medicine or family practice provider at [facility] on d/c to make sure pathology gets f/u on   Maude Emmer 09/01/2024, 8:35 AM

## 2024-09-01 NOTE — Evaluation (Signed)
 Occupational Therapy Evaluation Patient Details Name: Brendan [name] MRN: [id number] DOB: 23-Jul-1961 Today's Date: 09/01/2024   History of Present Illness   63 y.o. male presents to [facility] 08/30/24 after R leg gave out during ambulation and popping noise was heard. X-ray showed right periprosthetic distal femur fracture. S/p IM nailing of R femur 11/4. 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     Clinical Impressions PTA Pt was independent with ADL/IADL tasks and functional mobility. Pt currently requires up to Mod A for functional transfers and up to Max A for LB ADL tasks. Pt is primarily limited by RLE pain and TDWB precautions, unsteadiness on feet, and decreased knowledge of DME/AE. Pt will benefit from skilled OT services in acute care to address functional transfers and provide education on AE for safe engagement in ADL tasks at home. With Pt progression, do not anticipate Pt will require OT services at d/c.      If plan is discharge home, recommend the following:   A little help with walking and/or transfers;A little help with bathing/dressing/bathroom;Assistance with cooking/housework;Help with stairs or ramp for entrance;Assist for transportation     Functional Status Assessment   Patient has had a recent decline in their functional status and demonstrates the ability to make significant improvements in function in a reasonable and predictable amount of time.     Equipment Recommendations   BSC/3in1;Other (comment) (rolling walker)     Recommendations for Other Services         Precautions/Restrictions   Precautions Precautions: Fall Recall of Precautions/Restrictions: Intact Precaution/Restrictions Comments: Unrestricted ROM Restrictions Weight Bearing Restrictions Per Provider Order: Yes RLE Weight Bearing Per Provider Order: Touchdown weight  bearing     Mobility Bed Mobility Overal bed mobility: Needs Assistance Bed Mobility: Supine to Sit     Supine to sit: Mod assist, Used rails     General bed mobility comments: Mod A to manage RLE off of bed towards R side. Able to use BUE to elevate and turn trunk. Pt able to use BUE to reposition self in recliner    Transfers Overall transfer level: Needs assistance Equipment used: Rolling walker (2 wheels) Transfers: Sit to/from Stand, Bed to chair/wheelchair/BSC Sit to Stand: Min assist, From elevated surface     Step pivot transfers: Min assist     General transfer comment: Pt stood from bed with slight elevation. Pt required min verbal cues for proper hand placement. Min A provided throughout transfer. Pt able to maintain WB precautions during step-pivot transfer to recliner on L side.      Balance Overall balance assessment: Needs assistance Sitting-balance support: Single extremity supported, Feet supported Sitting balance-Leahy Scale: Fair Sitting balance - Comments: Pt demonstrated preference for one UE supported in sitting.   Standing balance support: Bilateral upper extremity supported, During functional activity, Reliant on assistive device for balance Standing balance-Leahy Scale: Poor Standing balance comment: dependent on RW and external support for balance in standing                           ADL either performed or assessed with clinical judgement   ADL Overall ADL's : Needs assistance/impaired Eating/Feeding: Independent   Grooming: Independent;Sitting   Upper Body Bathing: Minimal assistance;Sitting   Lower Body Bathing: Maximal assistance;Sitting/lateral leans   Upper Body Dressing : Independent;Sitting   Lower Body Dressing: Maximal assistance  Toilet Transfer: Minimal assistance;Ambulation;BSC/3in1   Toileting- Architect and Hygiene: Moderate assistance;Sitting/lateral lean               Vision Baseline  Vision/History: 1 Wears glasses Patient Visual Report: No change from baseline Vision Assessment?: Wears glasses for reading     Perception         Praxis         Pertinent Vitals/Pain Pain Assessment Pain Assessment: No/denies pain     Extremity/Trunk Assessment Upper Extremity Assessment Upper Extremity Assessment: Overall WFL for tasks assessed   Lower Extremity Assessment Lower Extremity Assessment: Defer to PT evaluation   Cervical / Trunk Assessment Cervical / Trunk Assessment: Normal   Communication Communication Communication: No apparent difficulties   Cognition Arousal: Alert Behavior During Therapy: WFL for tasks assessed/performed Cognition: No apparent impairments                               Following commands: Intact       Cueing  General Comments   Cueing Techniques: Verbal cues  Pt wife present at bedside and encouraging throughout session.   Exercises     Shoulder Instructions      Home Living Family/patient expects to be discharged to:: Private residence Living Arrangements: Spouse/significant other Available Help at Discharge: Family;Available 24 hours/day Type of Home: House Home Access: Stairs to enter [employer] of Steps: 2 Entrance Stairs-Rails: Right;Left Home Layout: One level     Bathroom Shower/Tub: Producer, Television/film/video: Handicapped height Bathroom Accessibility: Yes How Accessible: Accessible via walker Home Equipment: Cane - single point          Prior Functioning/Environment Prior Level of Function : Independent/Modified Independent;Driving;History of Falls (last six months) (one previous fall walking off deck, same way he fell leading to this admission)             Mobility Comments: independent ADLs Comments: independent    OT Problem List: Decreased activity tolerance;Impaired balance (sitting and/or standing);Decreased knowledge of use of DME or AE;Decreased  knowledge of precautions;Pain   OT Treatment/Interventions: Self-care/ADL training;Therapeutic exercise;Energy conservation;DME and/or AE instruction;Therapeutic activities;Balance training;Patient/family education      OT Goals(Current goals can be found in the care plan section)   Acute Rehab OT Goals Patient Stated Goal: to use the bathroom OT Goal Formulation: With patient/family Time For Goal Achievement: 09/15/24 Potential to Achieve Goals: Good ADL Goals Pt Will Perform Lower Body Bathing: with modified independence;with adaptive equipment Pt Will Perform Lower Body Dressing: with min assist;with adaptive equipment Pt Will Transfer to Toilet: with modified independence;ambulating Pt Will Perform Toileting - Clothing Manipulation and hygiene: with supervision   OT Frequency:  Min 2X/week    Co-evaluation              AM-PAC OT 6 Clicks Daily Activity     Outcome Measure Help from another person eating meals?: None Help from another person taking care of personal grooming?: None Help from another person toileting, which includes using toliet, bedpan, or urinal?: A Lot Help from another person bathing (including washing, rinsing, drying)?: A Lot Help from another person to put on and taking off regular upper body clothing?: None Help from another person to put on and taking off regular lower body clothing?: A Lot 6 Click Score: 18   End of Session Equipment Utilized During Treatment: Gait belt;Rolling walker (2 wheels) Nurse Communication: Mobility status  Activity Tolerance: Patient  tolerated treatment well Patient left: in chair;with call bell/phone within reach;with chair alarm set  OT Visit Diagnosis: Unsteadiness on feet (R26.81);Muscle weakness (generalized) (M62.81);History of falling (Z91.81);Pain Pain - Right/Left: Right Pain - part of body: Leg                Time: 9083-9050 OT Time Calculation (min): 33 min Charges:  OT General Charges $OT Visit: 1  Visit OT Evaluation $OT Eval Low Complexity: 1 Low OT Treatments $Therapeutic Activity: 8-22 mins  Maurilio CROME, OTR/L.  [facility] Acute Rehabilitation  Office: [phone]   Maurilio PARAS Angela Vazguez 09/01/2024, 10:15 AM

## 2024-09-01 NOTE — Progress Notes (Signed)
 Orthopedic Tech Progress Note Patient Details:  Brendan [name] 08-28-1961 [id number]  Ortho Devices Type of Ortho Device: Crutches Ortho Device/Splint Location: RLE Ortho Device/Splint Interventions: Ordered   Post Interventions Patient Tolerated: Well Instructions Provided: Care of device  Adine MARLA Blush 09/01/2024, 7:28 PM

## 2024-09-01 NOTE — Progress Notes (Signed)
 Orthopaedic Trauma Service Progress Note  Patient ID: JAYCEION LISENBY MRN: [id number] DOB/AGE: [birth year] 63 y.o.  Subjective:  Doing great this morning.  Reports no pain.  Did take some oxycodone yesterday but has been using just Tylenol  today  Denies any additional issues  Does not use any assistive devices at baseline  He is eager to return back to golfing  Patient does report a remote history of being on prednisone  for 2+ years.  Does not recall the dosage when he was first diagnosed with sarcoid.  States that it has been in remission and currently he is on Dulera.  Does have a remote history of also being on methotrexate.  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  Remaining bone health labs are pending.  No history of the DEXA scan   ROS As above  Today's  total administered Morphine Milligram Equivalents: 0 Yesterday's total administered Morphine Milligram Equivalents: 50  Objective:   VITALS:   Vitals:   09/01/24 0425 09/01/24 0630 09/01/24 0811 09/01/24 0858  BP: 96/65   102/60  Pulse: 96  96 98  Resp: 18  18 18   Temp: 98.8 F (37.1 C)   98.4 F (36.9 C)  TempSrc: Oral   Oral  SpO2: 95% 98% 98% 97%  Weight:      Height:        Estimated body mass index is 29.84 kg/m as calculated from the following:   Height as of this encounter: 5' 10 (1.778 m).   Weight as of this encounter: 94.3 kg.   Intake/Output      11/04 0701 11/05 0700 11/05 0701 11/06 0700   I.V. (mL/kg) 800 (8.5)    IV Piggyback 900 100   Total Intake(mL/kg) 1700 (18) 100 (1.1)   Urine (mL/kg/hr) 675 (0.3)    Blood 50    Total Output 725    Net +975 +100          LABS  Results for orders placed or performed during the  hospital encounter of 08/30/24 (from the past 24 hours)  Glucose, capillary     Status: None   Collection Time: 08/31/24  4:51 PM  Result Value Ref Range   Glucose-Capillary 99 70 - 99 mg/dL  Glucose, capillary     Status: Abnormal   Collection Time: 08/31/24  5:06 PM  Result Value Ref Range   Glucose-Capillary 100 (H) 70 - 99 mg/dL  Glucose, capillary     Status: Abnormal   Collection Time: 08/31/24  9:31 PM  Result Value Ref Range   Glucose-Capillary 152 (H) 70 - 99 mg/dL  Glucose, capillary     Status: Abnormal   Collection Time: 08/31/24 10:10 PM  Result Value Ref Range   Glucose-Capillary 126 (H) 70 - 99 mg/dL  CBC     Status: Abnormal   Collection Time: 09/01/24  3:23 AM  Result Value Ref Range   WBC 9.4 4.0 - 10.5 K/uL   RBC 3.64 (L) 4.22 - 5.81 MIL/uL   Hemoglobin 11.0 (L) 13.0 - 17.0 g/dL   HCT 66.3 (L) 60.9 - 47.9 %   MCV 92.3 80.0 - 100.0 fL  MCH 30.2 26.0 - 34.0 pg   MCHC 32.7 30.0 - 36.0 g/dL   RDW 86.0 88.4 - 84.4 %   Platelets 114 (L) 150 - 400 K/uL   nRBC 0.0 0.0 - 0.2 %  Basic metabolic panel     Status: Abnormal   Collection Time: 09/01/24  3:23 AM  Result Value Ref Range   Sodium 137 135 - 145 mmol/L   Potassium 3.8 3.5 - 5.1 mmol/L   Chloride 101 98 - 111 mmol/L   CO2 24 22 - 32 mmol/L   Glucose, Bld 129 (H) 70 - 99 mg/dL   BUN 15 8 - 23 mg/dL   Creatinine, Ser 8.22 (H) 0.61 - 1.24 mg/dL   Calcium  8.7 (L) 8.9 - 10.3 mg/dL   GFR, Estimated 43 (L) >60 mL/min   Anion gap 12 5 - 15  VITAMIN D  25 Hydroxy (Vit-D Deficiency, Fractures)     Status: None   Collection Time: 09/01/24  3:23 AM  Result Value Ref Range   Vit D, 25-Hydroxy 69.54 30 - 100 ng/mL  TSH     Status: None   Collection Time: 09/01/24  3:23 AM  Result Value Ref Range   TSH 0.750 0.350 - 4.500 uIU/mL  Glucose, capillary     Status: Abnormal   Collection Time: 09/01/24  5:50 AM  Result Value Ref Range   Glucose-Capillary 128 (H) 70 - 99 mg/dL  Glucose, capillary     Status: Abnormal    Collection Time: 09/01/24 11:16 AM  Result Value Ref Range   Glucose-Capillary 129 (H) 70 - 99 mg/dL     PHYSICAL EXAM:   Gen: Sitting up in bed, no acute distress, wife at bedside, very pleasant Lungs: Unlabored Ext:       Right Lower Extremity Dressing is clean, dry and intact  Extremity is warm  No DCT  Compartments are soft  No pain out of proportion with passive stretching of toes or ankle  DPN, SPN, TN sensory functions are intact  EHL, FHL, lesser toe motor functions intact  Ankle flexion, extension, inversion eversion intact  + DP pulse   Assessment/Plan: 1 Day Post-Op   Principal Problem:   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 Active Problems:   Coronary artery disease involving native coronary artery of native heart without angina pectoris   Displaced supracondylar fracture of distal end of right femur without intracondylar extension (HCC)   Anti-infectives (From admission, onward)    Start     Dose/Rate Route Frequency Ordered Stop   08/31/24 1500  ceFAZolin (ANCEF) IVPB 2g/100 mL premix        2 g 200 mL/hr over 30 Minutes Intravenous Every 6 hours 08/31/24 1133 08/31/24 2145   08/31/24 0745  ceFAZolin (ANCEF) IVPB 2g/100 mL premix        2 g 200 mL/hr over 30 Minutes Intravenous To ShortStay Surgical 08/30/24 1912 08/31/24 0913     .  POD/HD#: 39  63 year old male history of sarcoid with fragility fracture right distal femur above total knee arthroplasty  - Fragility fracture right distal femur above total knee arthroplasty s/p retrograde intramedullary nailing  Touchdown weightbearing right leg for about 4 weeks and then advance as tolerated  Unrestricted range of motion of right hip, knee and ankle  Start therapy evaluations  Dressing changes starting tomorrow but may leave in place until we know his discharge   Ice and elevate for swelling and pain control  Bone foam when not  working on range of motion exercises  to help prevent further knee contracture  PT- please teach HEP for right knee ROM- AROM, PROM. Prone exercises as well. No ROM restrictions.  Quad sets, SLR, LAQ, SAQ, heel slides, stretching, prone flexion and extension   Ankle theraband program, heel cord stretching, toe towel curls, etc   No pillows under bend of knee when at rest, ok to place under heel to help work on extension. Can also use zero knee bone foam if available DO NOT LET KNEE REST IN FLEXION!!!!!  - Pain management:  Multimodal  - ABL anemia/Hemodynamics  Monitor  Minimal blood loss intraoperatively  - Medical issues   Per primary  - DVT/PE prophylaxis:  Okay to resume aspirin  and Plavix  from Ortho standpoint  - ID:   Periop abx   - Metabolic Bone Disease:  Vitamin D  levels look good  Concerned that sarcoid could be contributing to a pathologic process in his distal femur.  Surgical pathology is pending  Follow-up on remaining labs  - Activity:  As above  - Impediments to fracture healing:  Fragility fracture right distal femur  H/o sarcoid  - Dispo:  Continue with inpatient care  Follow-up on surgical pathology  Ortho issues are stable, therapy evaluations  Possible discharge home tomorrow afternoon   Francis MICAEL Mt, PA-C [phone] (C) 09/01/2024, 11:19 AM  [facility] [address] [place] [address] [phone] [phone] (F)    After 5pm and on the weekends please log on to Amion, go to orthopaedics and the look under the [facility] Call for the provider(s) on call. You can also call our office at [phone] and then follow the prompts to be connected to the call team.  Patient ID: [name], male   DOB: 07/30/1961, 63 y.o.   MRN: [id number]

## 2024-09-01 NOTE — Progress Notes (Signed)
 Orthopedic Tech Progress Note Patient Details:  Brendan [name] Nov 08, 1960 [id number]  Ortho Devices Type of Ortho Device: Bone foam zero knee Ortho Device/Splint Location: RLE/did not apply: Pt.in recliner Ortho Device/Splint Interventions: Ordered   Post Interventions Patient Tolerated: Well Instructions Provided: Care of device, Adjustment of device  Adine MARLA Blush 09/01/2024, 11:51 AM

## 2024-09-01 NOTE — Progress Notes (Signed)
 PROGRESS NOTE Brendan [name]  FMW:[id number] DOB: [birth year] DOA: 08/30/2024 PCP: [name] LABOR, PA-C  Brief Narrative/Hospital Course: Brendan [name] is a 63 y.o. male with PMH of with PMH of  CVA on plavix , history of sarcoidosis, diabetes mellitus, left knee replacement, hyperlipidemia, hypertension, diabetes mellitus on insulin  presented after hearing a pop on the right knee while stepping down and not able to stop himself from falling, sat down for a while and tried to get up but felt like his knee was unstable and did not put any weight and brought to the ED. In the ED hemodynamically stable, labs with creatinine 1.5, x-ray showed right periprosthetic distal femur fracture and orthopedic was consulted and admission requested under medicine service for further management.  Subjective: Seen and examined Resting comfortably in the bedside chair waiting for OT Wife at the bedside pain is controlled Overnight patient febrile up to 101.9 last night Labs creatinine 1.7 CBC stable mild anemia   Assessment and plan:  Right periprosthetic distal femur fracture History of right knee replacement: Orthopedic input appreciated.  S/P IM nailing of the right femur 11/4 Continue pain management DVT prophylaxis-with resumption of aspirin  and Plavix  per ortho PT OT eval-okay for unrestricted range of motion of the knee and hip early mobilization and touchdown weightbearing on right leg.  Recent PFO closure CVA on plavix /aspirin : Cardiology input appreciated.  Okay to start low-dose Lopressor aspirin  81 and Plavix  fro m11/5  Fever episode 10/4 night: Remains asymptomatic if he has recurrence fever will obtain further workup, monitor 24 hours, cbc in am.  CKD stage IIIa: Baseline creatinine around 1.5, stable to slightly worse encourage oral hydration.Continue to monitor  history of sarcoidosis Stable.  Diabetes mellitus on insulin : Blood sugar well-controlled -on sliding scale  insulin   Recent Labs  Lab 08/31/24 0453 08/31/24 0556 08/31/24 1651 08/31/24 1706 08/31/24 2131 08/31/24 2210 09/01/24 0550  GLUCAP  --    < > 99 100* 152* 126* 128*  HGBA1C 6.7*  --   --   --   --   --   --    < > = values in this interval not displayed.    Hyperlipidemia Cont statins  CAD Hypertension: No chest pain.  BP stable-continue Coreg and amlodipine  DVT prophylaxis: SCDs Start: 08/31/24 1134 aspirin /Plavix  Code Status:   Code Status: Full Code Family Communication: plan of care discussed with patient/ at bedside. Patient status is: Remains hospitalized because of severity of illness Level of care: Med-Surg   Dispo: The patient is from: home             Anticipated disposition: TBD Objective: Vitals last 24 hrs: Vitals:   09/01/24 0120 09/01/24 0425 09/01/24 0630 09/01/24 0811  BP: 106/66 96/65    Pulse: (!) 106 96  96  Resp: 18 18  18   Temp: 98.7 F (37.1 C) 98.8 F (37.1 C)    TempSrc: Oral Oral    SpO2: 95% 95% 98% 98%  Weight:      Height:        Physical Examination: General exam: alert awake, oriented, older than stated age HEENT:Oral mucosa moist, Ear/Nose WNL grossly Respiratory system: Bilaterally clear BS,no use of accessory muscle Cardiovascular system: S1 & S2 +, No JVD. Gastrointestinal system: Abdomen soft,NT,ND, BS+ Nervous System: Alert, awake, moving all extremities,and following commands. Extremities: extremities warm, leg edema neg, right lower extremity with postop dressing in place distal toes and foot warm and able to move Skin: Warm, no rashes  MSK: Normal muscle bulk,tone, power   Medications reviewed:  Scheduled Meds:  acetaminophen   650 mg Oral Q6H   Or   acetaminophen   650 mg Rectal Q6H   amLODipine  10 mg Oral Daily   aspirin  EC  81 mg Oral Daily   atorvastatin   80 mg Oral QHS   carvedilol  6.25 mg Oral BID WC   clopidogrel   75 mg Oral Daily   docusate sodium  100 mg Oral BID   fluticasone  furoate-vilanterol  1  puff Inhalation Daily   insulin  aspart  0-5 Units Subcutaneous QHS   insulin  aspart  0-9 Units Subcutaneous TID WC   pantoprazole   40 mg Oral Daily   sodium chloride  flush  3 mL Intravenous Q12H   Continuous Infusions:   Diet: Diet Order             Diet Carb Modified Fluid consistency: Thin; Room service appropriate? Yes  Diet effective now                    Data Reviewed: I have personally reviewed following labs and imaging studies ( see epic result tab) CBC: Recent Labs  Lab 08/30/24 1334 09/01/24 0323  WBC 8.3 9.4  HGB 13.8 11.0*  HCT 42.3 33.6*  MCV 91.8 92.3  PLT 188 114*   CMP: Recent Labs  Lab 08/30/24 1334 08/31/24 0453 09/01/24 0323  NA 140 138 137  K 4.0 3.5 3.8  CL 104 105 101  CO2 25 23 24   GLUCOSE 127* 149* 129*  BUN 12 15 15   CREATININE 1.55* 1.69* 1.77*  CALCIUM  9.6 8.9 8.7*   GFR: Estimated Creatinine Clearance: 49.2 mL/min (A) (by C-G formula based on SCr of 1.77 mg/dL (H)). No results for input(s): AST, ALT, ALKPHOS, BILITOT, PROT, ALBUMIN in the last 168 hours. No results for input(s): LIPASE, AMYLASE in the last 168 hours. No results for input(s): AMMONIA in the last 168 hours. Coagulation Profile: No results for input(s): INR, PROTIME in the last 168 hours. Unresulted Labs (From admission, onward)     Start     Ordered   09/01/24 0500  CBC  Daily,   R     Comments: For 3 days.    08/31/24 1133   09/01/24 0500  Basic metabolic panel  Daily,   R     Comments: For 2 days .    08/31/24 1133   09/01/24 0500  Parathyroid hormone, intact (no Ca)  Tomorrow morning,   R        08/31/24 1512   09/01/24 0500  Testosterone   (Testosterone , Free, Total (PNL))  Tomorrow morning,   R        08/31/24 1512   09/01/24 0500  Testosterone , free  (Testosterone , Free, Total (PNL))  Tomorrow morning,   R        08/31/24 1512   09/01/24 0500  Testosterone , % free  (Testosterone , Free, Total (PNL))  Tomorrow morning,   R         08/31/24 1512   09/01/24 0500  Sex hormone binding globulin  (Testosterone , Free, Total (PNL))  Tomorrow morning,   R        08/31/24 1512           Antimicrobials/Microbiology: Anti-infectives (From admission, onward)    Start     Dose/Rate Route Frequency Ordered Stop   08/31/24 1500  ceFAZolin (ANCEF) IVPB 2g/100 mL premix        2 g 200 mL/hr  over 30 Minutes Intravenous Every 6 hours 08/31/24 1133 08/31/24 2145   08/31/24 0745  ceFAZolin (ANCEF) IVPB 2g/100 mL premix        2 g 200 mL/hr over 30 Minutes Intravenous To ShortStay Surgical 08/30/24 1912 08/31/24 0913      No results found for: SDES, SPECREQUEST, CULT, REPTSTATUS  Procedures: Procedure(s) (LRB): RETROGRADE INTRAMEDULLARY NAILING OF RIGHT FEMUR (Right)   Mennie LAMY, MD [facility] 09/01/2024, 10:57 AM

## 2024-09-02 ENCOUNTER — Other Ambulatory Visit (HOSPITAL_COMMUNITY): Payer: Self-pay

## 2024-09-02 ENCOUNTER — Inpatient Hospital Stay (HOSPITAL_COMMUNITY)

## 2024-09-02 DIAGNOSIS — I2510 Atherosclerotic heart disease of native coronary artery without angina pectoris: Secondary | ICD-10-CM | POA: Diagnosis not present

## 2024-09-02 LAB — BASIC METABOLIC PANEL WITH GFR
Anion gap: 10 (ref 5–15)
BUN: 17 mg/dL (ref 8–23)
CO2: 23 mmol/L (ref 22–32)
Calcium: 8.3 mg/dL — ABNORMAL LOW (ref 8.9–10.3)
Chloride: 103 mmol/L (ref 98–111)
Creatinine, Ser: 1.73 mg/dL — ABNORMAL HIGH (ref 0.61–1.24)
GFR, Estimated: 44 mL/min — ABNORMAL LOW (ref >60)
Glucose, Bld: 138 mg/dL — ABNORMAL HIGH (ref 70–99)
Potassium: 3.5 mmol/L (ref 3.5–5.1)
Sodium: 136 mmol/L (ref 135–145)

## 2024-09-02 LAB — GLUCOSE, CAPILLARY
Glucose-Capillary: 176 mg/dL — ABNORMAL HIGH (ref 70–99)
Glucose-Capillary: 178 mg/dL — ABNORMAL HIGH (ref 70–99)
Glucose-Capillary: 133 mg/dL — ABNORMAL HIGH (ref 70–99)
Glucose-Capillary: 161 mg/dL — ABNORMAL HIGH (ref 70–99)

## 2024-09-02 LAB — CBC
HCT: 30 % — ABNORMAL LOW (ref 39.0–52.0)
Hemoglobin: 10.1 g/dL — ABNORMAL LOW (ref 13.0–17.0)
MCH: 30.6 pg (ref 26.0–34.0)
MCHC: 33.7 g/dL (ref 30.0–36.0)
MCV: 90.9 fL (ref 80.0–100.0)
Platelets: 101 K/uL — ABNORMAL LOW (ref 150–400)
RBC: 3.3 MIL/uL — ABNORMAL LOW (ref 4.22–5.81)
RDW: 13.5 % (ref 11.5–15.5)
WBC: 10.7 K/uL — ABNORMAL HIGH (ref 4.0–10.5)
nRBC: 0 % (ref 0.0–0.2)

## 2024-09-02 LAB — PARATHYROID HORMONE, INTACT (NO CA): PTH: 50 pg/mL (ref 15–65)

## 2024-09-02 LAB — TESTOSTERONE: Testosterone: 111 ng/dL — ABNORMAL LOW (ref 264–916)

## 2024-09-02 MED ORDER — ACETAMINOPHEN 325 MG PO TABS
650.0000 mg | ORAL_TABLET | Freq: Four times a day (QID) | ORAL | 0 refills | Status: AC | PRN
  Filled 2024-09-02: qty 60, 8d supply, fill #0

## 2024-09-02 MED ORDER — AMLODIPINE BESYLATE 5 MG PO TABS
5.0000 mg | ORAL_TABLET | Freq: Every day | ORAL | Status: DC
  Filled 2024-09-02: qty 1

## 2024-09-02 MED ORDER — OXYCODONE HCL 5 MG PO TABS
5.0000 mg | ORAL_TABLET | Freq: Four times a day (QID) | ORAL | 0 refills | Status: AC | PRN
  Filled 2024-09-02: qty 20, 5d supply, fill #0

## 2024-09-02 MED ORDER — LACTATED RINGERS IV SOLN: INTRAVENOUS | Status: AC

## 2024-09-02 NOTE — Progress Notes (Signed)
 Orthopedic Tech Progress Note Patient Details:  Brendan [name] 18-May-1961 [id number]  Stat order for 15-20 mmHg graduated compression stockings called into [facility].  Patient ID: [name], male   DOB: [birth year], 63 y.o.   MRN: [id number]  [name] 09/02/2024, 11:15 AM

## 2024-09-02 NOTE — Discharge Instructions (Addendum)
 Orthopaedic Trauma Service Discharge Instructions   General Discharge Instructions  Orthopaedic Injuries:  Right distal femur fracture treated with intramedullary nailing  WEIGHT BEARING STATUS: Touchdown weightbearing right leg with assistance using  RANGE OF MOTION/ACTIVITY: Right knee and hip.  Slowly increase activity as tolerated maintaining weightbearing restrictions  Bone health: vitamin d  levels look Very good but still recommend 5000 IUs of vitamin D3 daily Labs also show testosterone  deficiency.  This does directly impact her bone health.  Would recommend discussing with your primary care doctor.  If they are unable to treat would recommend referral to either endocrinology or urology   Review the following resource for additional information regarding bone health  [email or web address]  Wound Care: Daily wound care as needed starting on 09/05/2024  Discharge Wound Care Instructions  Do NOT apply any ointments, solutions or lotions to pin sites or surgical wounds.  These prevent needed drainage and even though solutions like hydrogen peroxide kill bacteria, they also damage cells lining the pin sites that help fight infection.  Applying lotions or ointments can keep the wounds moist and can cause them to breakdown and open up as well. This can increase the risk for infection. When in doubt call the office.  Surgical incisions should be dressed daily.  If any drainage is noted, use one layer of adaptic or Mepitel, then gauze, Kerlix, and an ace wrap.  Instead of Ace wrap you can use a compression sock.  Instead of gauze/kerlix you can you the silicone foam dressing (mepilex) which is what you currently have on    [email or web address]  [email or web address]  These dressing supplies should be available at local medical supply stores ([facility], [facility], etc). They are not usually carried at places like [facility], [facility], [facility], etc  Once the incision is completely dry and without drainage, it may be left open to air out.  Showering may begin 36-48 hours later.  Cleaning gently with soap and water.  DVT/PE prophylaxis: home plavix  and aspirin    Diet: as you were eating previously.  Can use over the counter stool softeners and bowel preparations, such as Miralax, to help with bowel movements.  Narcotics can be constipating.  Be sure to drink plenty of fluids  PAIN MEDICATION USE AND EXPECTATIONS  You have likely been given narcotic medications to help control your pain.  After a traumatic event that results in an fracture (broken bone) with or without surgery, it is ok to use narcotic pain medications to help control one's pain.  We understand that everyone responds to pain differently and each individual patient will be evaluated on a regular basis for the continued need for narcotic medications. Ideally, narcotic medication use should last no more than 6-8 weeks (coinciding with fracture healing).   As a patient it is your responsibility as well to monitor narcotic medication use and report the amount and frequency you use these medications when you come to your office visit.   We would also advise that if you are using narcotic medications, you should take a dose prior to therapy to maximize you participation.  IF YOU ARE ON NARCOTIC MEDICATIONS IT IS NOT PERMISSIBLE TO OPERATE A MOTOR VEHICLE (MOTORCYCLE/CAR/TRUCK/MOPED) OR HEAVY MACHINERY DO NOT MIX NARCOTICS  WITH OTHER CNS (CENTRAL NERVOUS SYSTEM) DEPRESSANTS SUCH AS ALCOHOL   POST-OPERATIVE OPIOID TAPER INSTRUCTIONS: It is important to wean off of your opioid medication as soon as possible. If you do not need pain medication after your surgery it is ok to  stop day one. Opioids include: Codeine, Hydrocodone(Norco, Vicodin), Oxycodone(Percocet, oxycontin) and hydromorphone amongst others.  Long term and even short term use of opiods can cause: Increased pain response Dependence Constipation Depression Respiratory depression And more.  Withdrawal symptoms can include Flu like symptoms Nausea, vomiting And more Techniques to manage these symptoms Hydrate well Eat regular healthy meals Stay active Use relaxation techniques(deep breathing, meditating, yoga) Do Not substitute Alcohol to help with tapering If you have been on opioids for less than two weeks and do not have pain than it is ok to stop all together.  Plan to wean off of opioids This plan should start within one week post op of your fracture surgery  Maintain the same interval or time between taking each dose and first decrease the dose.  Cut the total daily intake of opioids by one tablet each day Next start to increase the time between doses. The last dose that should be eliminated is the evening dose.    STOP SMOKING OR USING NICOTINE PRODUCTS!!!!  As discussed nicotine severely impairs your body's ability to heal surgical and traumatic wounds but also impairs bone healing.  Wounds and bone heal by forming microscopic blood vessels (angiogenesis) and nicotine is a vasoconstrictor (essentially, shrinks blood vessels).  Therefore, if vasoconstriction occurs to these microscopic blood vessels they essentially disappear and are unable to deliver necessary nutrients to the healing tissue.  This is one modifiable factor that you can do to dramatically increase your chances of healing your injury.    (This means no smoking, no  nicotine gum, patches, etc)  DO NOT USE NONSTEROIDAL ANTI-INFLAMMATORY DRUGS (NSAID'S)  Using products such as Advil (ibuprofen), Aleve (naproxen), Motrin (ibuprofen) for additional pain control during fracture healing can delay and/or prevent the healing response.  If you would like to take over the counter (OTC) medication, Tylenol  (acetaminophen ) is ok.  However, some narcotic medications that are given for pain control contain acetaminophen  as well. Therefore, you should not exceed more than 4000 mg of tylenol  in a day if you do not have liver disease.  Also note that there are may OTC medicines, such as cold medicines and allergy medicines that my contain tylenol  as well.  If you have any questions about medications and/or interactions please ask your doctor/PA or your pharmacist.      ICE AND ELEVATE INJURED/OPERATIVE EXTREMITY  Using ice and elevating the injured extremity above your heart can help with swelling and pain control.  Icing in a pulsatile fashion, such as 20 minutes on and 20 minutes off, can be followed.    Do not place ice directly on skin. Make sure there is a barrier between to skin and the ice pack.    Using frozen items such as frozen peas works well as the conform nicely to the are that needs to be iced.  USE AN ACE WRAP OR TED HOSE FOR SWELLING CONTROL  In addition to icing and elevation, Ace wraps or TED hose are used to help limit and resolve swelling.  It is recommended to use Ace wraps or TED hose until you are informed to stop.    When using Ace Wraps start the wrapping distally (farthest away from the body) and wrap proximally (closer to the body)   Example: If you had surgery on your leg and you do not have a splint on, start the ace wrap at the toes and work your way up to the thigh  If you had surgery on your upper extremity and do not have a splint on, start the ace wrap at your fingers and work your way up to the upper arm  IF YOU ARE IN A SPLINT OR CAST  DO NOT REMOVE IT FOR ANY REASON   If your splint gets wet for any reason please contact the office immediately. You may shower in your splint or cast as long as you keep it dry.  This can be done by wrapping in a cast cover or garbage back (or similar)  Do Not stick any thing down your splint or cast such as pencils, money, or hangers to try and scratch yourself with.  If you feel itchy take benadryl  as prescribed on the bottle for itching  IF YOU ARE IN A CAM BOOT (BLACK BOOT)  You may remove boot periodically. Perform daily dressing changes as noted below.  Wash the liner of the boot regularly and wear a sock when wearing the boot. It is recommended that you sleep in the boot until told otherwise    Call office for the following: Temperature greater than 101F Persistent nausea and vomiting Severe uncontrolled pain Redness, tenderness, or signs of infection (pain, swelling, redness, odor or green/yellow discharge around the site) Difficulty breathing, headache or visual disturbances Hives Persistent dizziness or light-headedness Extreme fatigue Any other questions or concerns you may have after discharge  In an emergency, call 911 or go to an Emergency Department at a nearby hospital  HELPFUL INFORMATION  If you had a block, it will wear off between 8-24 hrs postop typically.  This is period when your pain may go from nearly zero to the pain you would have had postop without the block.  This is an abrupt transition but nothing dangerous is happening.  You may take an extra dose of narcotic when this happens.  You should wean off your narcotic medicines as soon as you are able.  Most patients will be off or using minimal narcotics before their first postop appointment.   We suggest you use the pain medication the first night prior to going to bed, in order to ease any pain when the anesthesia wears off. You should avoid taking pain medications on an empty stomach as it will make you  nauseous.  Do not drink alcoholic beverages or take illicit drugs when taking pain medications.  In most states it is against the law to drive while you are in a splint or sling.  And certainly against the law to drive while taking narcotics.  You may return to work/school in the next couple of days when you feel up to it.   Pain medication may make you constipated.  Below are a few solutions to try in this order: Decrease the amount of pain medication if you aren't having pain. Drink lots of decaffeinated fluids. Drink prune juice and/or each dried prunes  If the first 3 don't work start with additional solutions Take Colace - an over-the-counter stool softener Take Senokot - an over-the-counter laxative Take Miralax - a stronger over-the-counter laxative     CALL THE OFFICE WITH ANY QUESTIONS OR CONCERNS: [phone]   VISIT OUR WEBSITE FOR ADDITIONAL INFORMATION: [email or web address]

## 2024-09-02 NOTE — Progress Notes (Signed)
 Physical Therapy Treatment Patient Details Name: JAMISEN DUERSON MRN: [id number] DOB: Oct 04, 1961 Today's Date: 09/02/2024   History of Present Illness 63 y.o. male presents to [facility] 08/30/24 after R leg gave out during ambulation and popping noise was heard. X-ray showed right periprosthetic distal femur fracture. S/p IM nailing of R femur 11/4. 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    PT Comments  Pt greeted seated in recliner chair, pleasant and agreeable to PT session. He continues to be limited by symptomatic orthostatic hypotension. Pt's 15-20 mmHg graduated compression stockings arrived from [place] during session. PT and Mobility Specialists donned socks on BLE and re-assessed vitals. Pt was able to ambulate a short distance into the hallway before c/o lightheadedness and dizziness. Utilized a close chair follow for safety. Advised pt to utilize RW over crutches for increased stability and support. Overall, he required CGA. Will continue to follow acutely and advance appropriately including gait and stair training.    09/02/24 1100  Vital Signs  Patient Position (if appropriate) Orthostatic Vitals  Orthostatic Sitting  BP- Sitting 117/70  Orthostatic Standing at 0 minutes  BP- Standing at 0 minutes 90/51  Orthostatic Standing at 3 minutes  BP- Standing at 3 minutes (!) 73/49   Compression Stockings Applied   09/02/24 1218  Orthostatic Sitting  BP- Sitting 110/68  Orthostatic Standing at 0 minutes  BP- Standing at 0 minutes (!) 108/96  Orthostatic Standing at 3 minutes  BP- Standing at 3 minutes (!) 82/57      If plan is discharge home, recommend the following: A little help with walking and/or transfers;A little help with bathing/dressing/bathroom;Assistance with cooking/housework;Assist for transportation;Help with stairs or ramp for entrance   Can travel by  private vehicle        Equipment Recommendations  Wheelchair (measurements PT);Wheelchair cushion (measurements PT);Rolling walker (2 wheels);Crutches;BSC/3in1    Recommendations for Other Services       Precautions / Restrictions Precautions Precautions: Fall Recall of Precautions/Restrictions: Intact Precaution/Restrictions Comments: Unrestricted ROM R hip and knee Restrictions Weight Bearing Restrictions Per Provider Order: Yes RLE Weight Bearing Per Provider Order: Touchdown weight bearing     Mobility  Bed Mobility               General bed mobility comments: Not assessed. Pt greeted seated in recliner and returned there at end of session.    Transfers Overall transfer level: Needs assistance Equipment used: Rolling walker (2 wheels), Crutches Transfers: Sit to/from Stand, Bed to chair/wheelchair/BSC Sit to Stand: Min assist, Contact guard assist   Step pivot transfers: Contact guard assist       General transfer comment: Pt stood from recliner chair with crutches and RW. He demonstrated proper hand placement and minimize weight in R foot. Pt powered up with minA for crutches and CGA for RW. He appeared more stable/secure using the RW. Transferred chair<>commode. Good eccentric control, reaching back for arm rest.    Ambulation/Gait Ambulation/Gait assistance: Contact guard assist, +2 safety/equipment Gait Distance (Feet): 15 Feet (prologed seated rest break; 2x5, with seated rest on commode) Assistive device: Rolling walker (2 wheels) Gait Pattern/deviations: Step-to pattern, Decreased stride length Gait velocity: decreased     General Gait Details: Pt ambulated using a hopping technique. He intermittently rested his right foot flat on the floor. Cued pt to decrease to only toes and limit weight acceptance. Distance limited d/t onset of lightheadedness, dizziness, and  cloudy vision.   Stairs             Wheelchair Mobility     Tilt Bed     Modified Rankin (Stroke Patients Only)       Balance Overall balance assessment: Needs assistance Sitting-balance support: Feet supported, Bilateral upper extremity supported Sitting balance-Leahy Scale: Fair     Standing balance support: Bilateral upper extremity supported, Reliant on assistive device for balance, Single extremity supported, During functional activity Standing balance-Leahy Scale: Poor Standing balance comment: Pt dependent on RW. He was able to pull pants up/down at commode with unilat support.                            Communication Communication Communication: No apparent difficulties  Cognition Arousal: Alert Behavior During Therapy: WFL for tasks assessed/performed   PT - Cognitive impairments: No apparent impairments                         Following commands: Intact      Cueing Cueing Techniques: Verbal cues  Exercises      General Comments General comments (skin integrity, edema, etc.): BP: seated 117/70 (85), standing 90/51 (59), immediately upon sitting from static stance where pt was unable to maintain the full 3 mins d/t worsening lightheadedness and onset of cloudy vision 73/49, seated with legs elevated111/69 (82). [employer] rep arrived with 15-20 mmHg graduated compression stockings. Donned on BLE. Re-assessed vitals with ted hose on. BP: seated 110/68 (81), standing 108/96, immediately upon sitting following ambulation that was stopped d/t c/o worsening symptoms 82/57 (65), seated with legs elevated 131/69 (87).      Pertinent Vitals/Pain Pain Assessment Pain Assessment: Faces Faces Pain Scale: Hurts little more Pain Location: RLE when donning 15-20 mmHg graduated compression stockings Pain Descriptors / Indicators: Operative site guarding, Grimacing, Discomfort Pain Intervention(s): Monitored during session, Limited activity within patient's tolerance, RN gave pain meds during session, Repositioned    Home Living                           Prior Function            PT Goals (current goals can now be found in the care plan section) Acute Rehab PT Goals Patient Stated Goal: Return Home and regain independence PT Goal Formulation: With patient/family Time For Goal Achievement: 09/15/24 Potential to Achieve Goals: Good Progress towards PT goals: Progressing toward goals    Frequency    Min 2X/week      PT Plan      Co-evaluation              AM-PAC PT 6 Clicks Mobility   Outcome Measure  Help needed turning from your back to your side while in a flat bed without using bedrails?: A Lot Help needed moving from lying on your back to sitting on the side of a flat bed without using bedrails?: A Lot Help needed moving to and from a bed to a chair (including a wheelchair)?: A Little Help needed standing up from a chair using your arms (e.g., wheelchair or bedside chair)?: A Little Help needed to walk in hospital room?: A Little Help needed climbing 3-5 steps with a railing? : A Lot 6 Click Score: 15    End of Session Equipment Utilized During Treatment: Gait belt Activity Tolerance: Treatment limited secondary to medical complications (Comment) (symptomatic  orthostatic hypotension) Patient left: in chair;with call bell/phone within reach;with family/visitor present Nurse Communication: Mobility status PT Visit Diagnosis: Difficulty in walking, not elsewhere classified (R26.2);Other abnormalities of gait and mobility (R26.89);Unsteadiness on feet (R26.81)     Time: 8856-8776 PT Time Calculation (min) (ACUTE ONLY): 40 min  Charges:    $Gait Training: 8-22 mins $Therapeutic Activity: 23-37 mins PT General Charges $$ ACUTE PT VISIT: 1 Visit                     Randall SAUNDERS, PT, DPT Acute Rehabilitation Services Office: [phone] Secure Chat Preferred  Delon CHRISTELLA Callander 09/02/2024, 2:42 PM

## 2024-09-02 NOTE — Progress Notes (Signed)
 Occupational Therapy Treatment Patient Details Name: Brendan [name] MRN: [id number] DOB: August 07, 1961 Today's Date: 09/02/2024   History of present illness 63 y.o. male presents to [facility] 08/30/24 after R leg gave out during ambulation and popping noise was heard. X-ray showed right periprosthetic distal femur fracture. S/p IM nailing of R femur 11/4. 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   OT comments  Patient received in recliner with no complaints of pain.  BP checked in sitting with 108/60 (75).  Patient stood from recliner to crutches with BP 82/57 (63) and patient returned to recliner and BP was checked again with 94/68 (75).  Patient remained in recliner and addressed LB dressing with AE.  Demonstration and education provided on using reacher and sock aide for LB dressing with donning pants and socks. Patient able to return demonstration with min assist and CGA when standing to pull up clothing.  BP checked again in sitting with 104/63 (75) and 83/61 (67) with patient stating mild dizziness when standing.  Discharge recommendations continue to be appropriate. Acute OT to continue to follow to address established goals.       If plan is discharge home, recommend the following:  A little help with walking and/or transfers;A little help with bathing/dressing/bathroom;Assistance with cooking/housework;Help with stairs or ramp for entrance;Assist for transportation   Equipment Recommendations  BSC/3in1;Other (comment) (rolling walker)    Recommendations for Other Services      Precautions / Restrictions Precautions Precautions: Fall Recall of Precautions/Restrictions: Intact Precaution/Restrictions Comments: Unrestricted ROM R hip and knee Restrictions Weight Bearing Restrictions Per Provider Order: Yes RLE Weight Bearing Per Provider Order: Touchdown weight bearing        Mobility Bed Mobility Overal bed mobility: Needs Assistance             General bed mobility comments: OOB in recliner    Transfers Overall transfer level: Needs assistance Equipment used: Rolling walker (2 wheels), Crutches Transfers: Sit to/from Stand Sit to Stand: Contact guard assist           General transfer comment: sit to stands performed from recliner to address LB dressing and check orthostatics.  Patient able to stand with crutches but appears safer using RW for LB dressing     Balance Overall balance assessment: Needs assistance Sitting-balance support: Feet supported, Bilateral upper extremity supported Sitting balance-Leahy Scale: Fair     Standing balance support: Single extremity supported, Bilateral upper extremity supported, During functional activity Standing balance-Leahy Scale: Poor Standing balance comment: reliant on external support for standing and maintaining WB precautions                           ADL either performed or assessed with clinical judgement   ADL Overall ADL's : Needs assistance/impaired                     Lower Body Dressing: Minimal assistance;Contact guard assist;With adaptive equipment;Sit to/from stand Lower Body Dressing Details (indicate cue type and reason): education and demonstration on reacher and sock aide use for LB dressing and patient able to return demonstration with min assist and CGA while standing               General ADL Comments: patient safer standing with RW for pulling up pants    Extremity/Trunk Assessment  Vision       Perception     Praxis     Communication Communication Communication: No apparent difficulties   Cognition Arousal: Alert Behavior During Therapy: WFL for tasks assessed/performed Cognition: No apparent impairments                               Following commands: Intact        Cueing   Cueing Techniques:  Verbal cues  Exercises      Shoulder Instructions       General Comments BPs in notes    Pertinent Vitals/ Pain       Pain Assessment Pain Assessment: No/denies pain  Home Living                                          Prior Functioning/Environment              Frequency  Min 2X/week        Progress Toward Goals  OT Goals(current goals can now be found in the care plan section)  Progress towards OT goals: Progressing toward goals  Acute Rehab OT Goals Patient Stated Goal: to go home OT Goal Formulation: With patient/family Time For Goal Achievement: 09/15/24 Potential to Achieve Goals: Good ADL Goals Pt Will Perform Lower Body Bathing: with modified independence;with adaptive equipment Pt Will Perform Lower Body Dressing: with min assist;with adaptive equipment Pt Will Transfer to Toilet: with modified independence;ambulating Pt Will Perform Toileting - Clothing Manipulation and hygiene: with supervision  Plan      Co-evaluation                 AM-PAC OT 6 Clicks Daily Activity     Outcome Measure   Help from another person eating meals?: None Help from another person taking care of personal grooming?: None Help from another person toileting, which includes using toliet, bedpan, or urinal?: A Lot Help from another person bathing (including washing, rinsing, drying)?: A Lot Help from another person to put on and taking off regular upper body clothing?: None Help from another person to put on and taking off regular lower body clothing?: A Little 6 Click Score: 19    End of Session Equipment Utilized During Treatment: Gait belt;Rolling walker (2 wheels);Other (comment) (crutches)  OT Visit Diagnosis: Unsteadiness on feet (R26.81);Muscle weakness (generalized) (M62.81);History of falling (Z91.81);Pain   Activity Tolerance Patient tolerated treatment well   Patient Left in chair;with call bell/phone within reach;with  family/visitor present   Nurse Communication Mobility status        Time: 9091-9059 OT Time Calculation (min): 32 min  Charges: OT General Charges $OT Visit: 1 Visit OT Treatments $Self Care/Home Management : 23-37 mins  Dick [name], OTA Acute Rehabilitation Services  Office [phone]   Brendan [name] 09/02/2024, 12:19 PM

## 2024-09-02 NOTE — Progress Notes (Signed)
 Orthopedic Tech Progress Note Patient Details:  Brendan [name] 1961-10-06 [id number]  Crutches/Home DME needed for discharge was delivered yesterday, 09/01/2024. Ortho techs were called at 1008 to bring crutches for this pt, this ortho tech spoke with the pt's RN, [name], at 1017 to share the pt already has the item.   Patient ID: Brendan [name], male   DOB: [birth year], 63 y.o.   MRN: [id number]  [name] 09/02/2024, 10:22 AM

## 2024-09-02 NOTE — Progress Notes (Signed)
 Cardiologist:  Jordan  Subjective:  Denies SSCP, palpitations or Dyspnea Some postural low BP improved   Objective:  Vitals:   09/01/24 0858 09/01/24 1603 09/01/24 1924 09/02/24 0425  BP: 102/60 126/65 121/63 122/66  Pulse: 98 (!) 105 (!) 109 (!) 102  Resp: 18 16    Temp: 98.4 F (36.9 C) 99.5 F (37.5 C) 98.6 F (37 C) 98 F (36.7 C)  TempSrc: Oral Oral Oral   SpO2: 97% 91% 92% 95%  Weight:      Height:        Intake/Output from previous day:  Intake/Output Summary (Last 24 hours) at 09/02/2024 0730 Last data filed at 09/01/2024 2308 Gross per 24 hour  Intake 793.14 ml  Output 850 ml  Net -56.86 ml    Physical Exam:  Black male in no distress  Lungs clear SEM AV sclerosis murmur  Abdomen benign Post right femur fracture repair with prior TKR   Lab Results: Basic Metabolic Panel: Recent Labs    09/01/24 0323 09/02/24 0317  NA 137 136  K 3.8 3.5  CL 101 103  CO2 24 23  GLUCOSE 129* 138*  BUN 15 17  CREATININE 1.77* 1.73*  CALCIUM  8.7* 8.3*    CBC: Recent Labs    09/01/24 0323 09/02/24 0317  WBC 9.4 10.7*  HGB 11.0* 10.1*  HCT 33.6* 30.0*  MCV 92.3 90.9  PLT 114* 101*    Hemoglobin A1C: Recent Labs    08/31/24 0453  HGBA1C 6.7*    Thyroid  Function Tests: Recent Labs    09/01/24 0323  TSH 0.750     Imaging: DG FEMUR PORT, MIN 2 VIEWS RIGHT Result Date: 08/31/2024 EXAM: 2 VIEW(S) XRAY OF THE RIGHT FEMUR 08/31/2024 12:11:01 PM COMPARISON: Radiographs 08/30/2024 previous right knee replacement. CLINICAL HISTORY: 03948 Fracture [id number] Fracture [id number]. FINDINGS: BONES AND JOINTS: Interval intramedullary rodding of the right femur with proximal and distal screw fixation. Acute comminuted distal femoral periprosthetic fracture with mild residual displacement. Previous right knee replacement. No joint dislocation. SOFT TISSUES: Vascular calcifications. Gas in the soft tissues consistent with recent surgery. IMPRESSION: 1. Status post  intramedullary rodding of the right femur for comminuted distal femoral periprosthetic fracture. Electronically signed by: Luke Bun MD 08/31/2024 06:17 PM EST RP Workstation: [id number]   DG FEMUR, MIN 2 VIEWS RIGHT Result Date: 08/31/2024 EXAM: Multiple low-resolution intraoperative spot views of the right femur. 08/31/2024 10:11:00 AM COMPARISON: CT 08/30/2024, radiograph 08/30/2024. CLINICAL HISTORY: 461500 Elective surgery 461500 Elective surgery [phone]. FINDINGS: BONES AND JOINTS: Previous right knee replacement. Intraoperative intramedullary rodding and screw fixation of comminuted distal femoral periprosthetic fracture. Total fluoroscopy time was 1 minute 27 seconds, fluoroscopic dose of 16.12 mgy. IMPRESSION: 1. Intraoperative fluoroscopic assistance provided during surgical fixation of distal femoral fracture Electronically signed by: Luke Bun MD 08/31/2024 06:15 PM EST RP Workstation: [id number]   DG C-Arm 1-60 Min-No Report Result Date: 08/31/2024 Fluoroscopy was utilized by the requesting physician.  No radiographic interpretation.   DG C-Arm 1-60 Min-No Report Result Date: 08/31/2024 Fluoroscopy was utilized by the requesting physician.  No radiographic interpretation.    Cardiac Studies:  ECG: SR LAD no acute ST changes    Telemetry:  NSR   Echo: 08/05/24 EF 55-60% PFO closure intact AV sclerosis   Medications:    acetaminophen   650 mg Oral Q6H   Or   acetaminophen   650 mg Rectal Q6H   amLODipine  10 mg Oral Daily   aspirin  EC  81 mg Oral Daily  atorvastatin   80 mg Oral QHS   carvedilol  6.25 mg Oral BID WC   clopidogrel   75 mg Oral Daily   docusate sodium  100 mg Oral BID   fluticasone  furoate-vilanterol  1 puff Inhalation Daily   insulin  aspart  0-5 Units Subcutaneous QHS   insulin  aspart  0-9 Units Subcutaneous TID WC   pantoprazole   40 mg Oral Daily   sodium chloride  flush  3 mL Intravenous Q12H      Assessment/Plan:  63 y.o. with history of bilateral  TKR;s Had a mechanical fall with Right comminuted fracture of the distal right femur just above the femoral implant. He has a history of DM, HLD, HTN, Sarcoid and CAD. CAD has been stable with no angina Cath 08/01/22 with 70% mid LAD and 75% mid LCX dx. Moderate main 2 vessel dx with severe stenosis in two small diagonal branches not amenable to PCI  Prior to cath 07/25/22 he had non ischemic myovue.   CAD:  no angina continue ASA, coreg plavix  and statin PFO: recent closure 07/2024 no residual shunt on echo ASA/plavix  HLD:  continue statin HTN:  continue norvasc considering low BP will decrease dose to 5 mg daily  Fracture:  ? Pathologic. Tissue sent during operation. He does not have a local primary care doctor. Consider checking myeloma panel. Should be set up to see internal medicine or family practice provider at [facility] on d/c to make sure pathology gets f/u on   Cardiology will sign off   Brendan [name] 09/02/2024, 7:30 AM

## 2024-09-02 NOTE — Plan of Care (Signed)

## 2024-09-02 NOTE — Progress Notes (Signed)
 Orthopaedic Trauma Service Progress Note  Patient ID: Brendan [name] MRN: [id number] DOB/AGE: June 04, 1961 63 y.o.  Subjective:  Doing well Still using only tylenol  for pain control  Was a little dizzy with standing up this morning but not currently   Really wants to go home today  Would also like an xray of his left knee, he doesn't want the same thing to happen to his left leg, though he reports no pain currently   Surgical path still pending   Total testosterone  is low at 111 ng/dL. Remaining testosterone  panel is pending   ROS As above  Today's  total administered Morphine Milligram Equivalents: 0 Yesterday's total administered Morphine Milligram Equivalents: 0  Objective:   VITALS:   Vitals:   09/02/24 0425 09/02/24 0750 09/02/24 0842 09/02/24 0919  BP: 122/66 116/69  94/68  Pulse: (!) 102 97    Resp:  17    Temp: 98 F (36.7 C) 98.7 F (37.1 C)    TempSrc:  Oral    SpO2: 95% 95% 91%   Weight:      Height:        Estimated body mass index is 29.84 kg/m as calculated from the following:   Height as of this encounter: 5' 10 (1.778 m).   Weight as of this encounter: 94.3 kg.   Intake/Output      11/05 0701 11/06 0700 11/06 0701 11/07 0700   P.O.  240   I.V. (mL/kg)     IV Piggyback 793.1    Total Intake(mL/kg) 793.1 (8.4) 240 (2.5)   Urine (mL/kg/hr) 850 (0.4) 800 (2.2)   Blood     Total Output 850 800   Net -56.9 -560          LABS  Results for orders placed or performed during the hospital encounter of 08/30/24 (from the past 24 hours)  Glucose, capillary     Status: Abnormal   Collection Time: 09/01/24 11:16 AM  Result Value Ref Range   Glucose-Capillary 129 (H) 70 - 99 mg/dL  Glucose, capillary     Status: Abnormal   Collection Time: 09/01/24  4:25 PM  Result Value Ref Range   Glucose-Capillary 151 (H) 70 - 99 mg/dL  Glucose, capillary     Status: Abnormal    Collection Time: 09/01/24  8:47 PM  Result Value Ref Range   Glucose-Capillary 151 (H) 70 - 99 mg/dL  CBC     Status: Abnormal   Collection Time: 09/02/24  3:17 AM  Result Value Ref Range   WBC 10.7 (H) 4.0 - 10.5 K/uL   RBC 3.30 (L) 4.22 - 5.81 MIL/uL   Hemoglobin 10.1 (L) 13.0 - 17.0 g/dL   HCT 69.9 (L) 60.9 - 47.9 %   MCV 90.9 80.0 - 100.0 fL   MCH 30.6 26.0 - 34.0 pg   MCHC 33.7 30.0 - 36.0 g/dL   RDW 86.4 88.4 - 84.4 %   Platelets 101 (L) 150 - 400 K/uL   nRBC 0.0 0.0 - 0.2 %  Basic metabolic panel     Status: Abnormal   Collection Time: 09/02/24  3:17 AM  Result Value Ref Range   Sodium 136 135 - 145 mmol/L   Potassium 3.5 3.5 - 5.1 mmol/L   Chloride 103 98 - 111 mmol/L   CO2  23 22 - 32 mmol/L   Glucose, Bld 138 (H) 70 - 99 mg/dL   BUN 17 8 - 23 mg/dL   Creatinine, Ser 8.26 (H) 0.61 - 1.24 mg/dL   Calcium  8.3 (L) 8.9 - 10.3 mg/dL   GFR, Estimated 44 (L) >60 mL/min   Anion gap 10 5 - 15  Glucose, capillary     Status: Abnormal   Collection Time: 09/02/24  9:03 AM  Result Value Ref Range   Glucose-Capillary 176 (H) 70 - 99 mg/dL     PHYSICAL EXAM:   Gen: Sitting up in chair, no acute distress, wife at bedside, very pleasant. Stood up 2x for me without assistance or difficulty  Lungs: Unlabored Ext:       Right Lower Extremity Dressing is clean, dry and intact  Dressings changed  All wounds look great  Scant drainage from anterior knee incision   New mepilex dressings applied  Swelling mild             Extremity is warm             No DCT             Compartments are soft             No pain out of proportion with passive stretching of toes or ankle             DPN, SPN, TN sensory functions are intact             EHL, FHL, lesser toe motor functions intact             Ankle flexion, extension, inversion eversion intact             + DP pulse  Assessment/Plan: 2 Days Post-Op   Principal Problem:   Periprosthetic fracture around internal prosthetic right  knee joint Active Problems:   Coronary artery disease involving native coronary artery of native heart without angina pectoris   Displaced supracondylar fracture of distal end of right femur without intracondylar extension (HCC)   Anti-infectives (From admission, onward)    Start     Dose/Rate Route Frequency Ordered Stop   08/31/24 1500  ceFAZolin (ANCEF) IVPB 2g/100 mL premix        2 g 200 mL/hr over 30 Minutes Intravenous Every 6 hours 08/31/24 1133 08/31/24 2145   08/31/24 0745  ceFAZolin (ANCEF) IVPB 2g/100 mL premix        2 g 200 mL/hr over 30 Minutes Intravenous To ShortStay Surgical 08/30/24 1912 08/31/24 0913     .  POD/HD#: 74  63 year old male history of sarcoid with fragility fracture right distal femur above total knee arthroplasty   - Fragility fracture right distal femur above total knee arthroplasty s/p retrograde intramedullary nailing             Touchdown weightbearing right leg for about 4 weeks and then advance as tolerated             Unrestricted range of motion of right hip, knee and ankle                 Dressing changed today.  Can start dressing changes again on 09/05/2024.  Okay to leave open to air once there is no drainage and can shower once there is no drainage.  Clean with soap and water only.  Wound care was reviewed with patient and wife.  They are also a comprehensive wound care instruction in the discharge  paperwork               Ice and elevate for swelling and pain control             Bone foam when not working on range of motion exercises to help prevent further knee contracture   PT- please teach HEP for right knee ROM- AROM, PROM. Prone exercises as well. No ROM restrictions.  Quad sets, SLR, LAQ, SAQ, heel slides, stretching, prone flexion and extension   Ankle theraband program, heel cord stretching, toe towel curls, etc   No pillows under bend of knee when at rest, ok to place under heel to help work on extension. Can also use zero knee  bone foam if available DO NOT LET KNEE REST IN FLEXION!!!!!   - Pain management:             Multimodal   - ABL anemia/Hemodynamics             Stable              Minimal blood loss intraoperatively   - Medical issues              Per primary   - DVT/PE prophylaxis:             Okay to resume aspirin  and Plavix  from Ortho standpoint     - ID:              Periop abx completed    - Metabolic Bone Disease:             Vitamin D  levels look good             Concerned that sarcoid could be contributing to a pathologic process in his distal femur.  Surgical pathology is pending               Total testosterone  is low.  Await daily components of the testosterone  panel but would recommend follow-up with PCP.  If PCP unable to address can make referral to endocrinology or urology   - Activity:             As above   - Impediments to fracture healing:             Fragility fracture right distal femur             H/o sarcoid   - Dispo:             Ortho issues are stable  Follow-up with orthopedics in 10 to 14 days for suture removal and follow-up x-rays   Francis MICAEL Mt, PA-C [phone] (C) 09/02/2024, 10:48 AM  [facility] [address] [place] [address] [phone] [phone] (F)    After 5pm and on the weekends please log on to Amion, go to orthopaedics and the look under the [facility] Call for the provider(s) on call. You can also call our office at [phone] and then follow the prompts to be connected to the call team.  Patient ID: Brendan [name], male   DOB: 1961-07-27, 63 y.o.   MRN: [id number]

## 2024-09-02 NOTE — Progress Notes (Signed)
 PROGRESS NOTE Brendan [name]  FMW:[id number] DOB: [birth year] DOA: 08/30/2024 PCP: [name] LABOR, PA-C  Brief Narrative/Hospital Course: Brendan [name] is a 63 y.o. male with PMH of with PMH of  CVA on plavix , history of sarcoidosis, diabetes mellitus, left knee replacement, hyperlipidemia, hypertension, diabetes mellitus on insulin  presented after hearing a pop on the right knee while stepping down and not able to stop himself from falling, sat down for a while and tried to get up but felt like his knee was unstable and did not put any weight and brought to the ED. In the ED hemodynamically stable, labs with creatinine 1.5, x-ray showed right periprosthetic distal femur fracture and orthopedic was consulted and admission requested under medicine service for further management. Patient underwent IM nailing right femur 11/4 postop fairly doing okay  Subjective: Seen and examined Patient reports being orthostatic dizzy on standing and walking Overnight has been afebrile more than 24 hours Labs reviewed creatinine stable at 1.7 hemoglobin 10.1 WBC 10.7 platelet 101 Mild cough  Assessment and plan:  Right periprosthetic distal femur fracture History of right knee replacement: Orthopedic input appreciated.  S/P IM nailing of the right femur 11/4. Continue aspirin  and Plavix  for DVT prophylaxis.  Continue PT OT pain management Okay for unrestricted range of motion of the knee and hip early mobilization and touchdown weightbearing on right leg. TCC ended during operations question pathologic fracture, order myeloma panel, patient wants to follow-up with internal medicine or family practice at [facility] consulted   Recent PFO closure CVA on plavix /aspirin  CAD HLD: Cardiology input appreciated.  Continue Coreg, aspirin  Plavix  statin.   Orthostatic hypotension: Patient received IV fluid bolus yesterday, amlodipine on hold-add compression stocking, keep on IV fluid hydration  overnight   Hypertension: Continue Coreg. Amlodipine on hold  Fever episode 10/4 night: Has been afebrile more than 24 hours no signs of infections otherwise.  CKD stage IIIa: Baseline creatinine around 1.5, stable to slightly worse encourage oral hydration.Continue to monitor  history of sarcoidosis Stable.  Diabetes mellitus on insulin : Blood sugar well-controlled -on sliding scale insulin   Recent Labs  Lab 08/31/24 0453 08/31/24 0556 09/01/24 1116 09/01/24 1625 09/01/24 2047 09/02/24 0903 09/02/24 1114  GLUCAP  --    < > 129* 151* 151* 176* 178*  HGBA1C 6.7*  --   --   --   --   --   --    < > = values in this interval not displayed.    Mobility: PT Orders: Active PT Follow up Rec: Home Health Pt11/02/2024 1400    DVT prophylaxis: SCDs Start: 08/31/24 1134 aspirin /Plavix  Code Status:   Code Status: Full Code Family Communication: plan of care discussed with patient/ at bedside. Patient status is: Remains hospitalized because of severity of illness Level of care: Med-Surg   Dispo: The patient is from: home             Anticipated disposition: home w/ hh ~ 24 hrs Objective: Vitals last 24 hrs: Vitals:   09/02/24 0425 09/02/24 0750 09/02/24 0842 09/02/24 0919  BP: 122/66 116/69  94/68  Pulse: (!) 102 97    Resp:  17    Temp: 98 F (36.7 C) 98.7 F (37.1 C)    TempSrc:  Oral    SpO2: 95% 95% 91%   Weight:      Height:        Physical Examination: General exam: alert awake, oriented, older than stated age HEENT:Oral mucosa moist, Ear/Nose WNL grossly  Respiratory system: Bilaterally clear BS,no use of accessory muscle Cardiovascular system: S1 & S2 +, No JVD. Gastrointestinal system: Abdomen soft,NT,ND, BS+ Nervous System: Alert, awake, moving all extremities,and following commands. Extremities: RLE dressing + Skin: Warm, no rashes MSK: Normal muscle bulk,tone, power   Medications reviewed:  Scheduled Meds:  acetaminophen   650 mg Oral Q6H   Or    acetaminophen   650 mg Rectal Q6H   amLODipine  5 mg Oral Daily   aspirin  EC  81 mg Oral Daily   atorvastatin   80 mg Oral QHS   carvedilol  6.25 mg Oral BID WC   clopidogrel   75 mg Oral Daily   docusate sodium  100 mg Oral BID   fluticasone  furoate-vilanterol  1 puff Inhalation Daily   insulin  aspart  0-5 Units Subcutaneous QHS   insulin  aspart  0-9 Units Subcutaneous TID WC   pantoprazole   40 mg Oral Daily   sodium chloride  flush  3 mL Intravenous Q12H   Continuous Infusions:   Diet: Diet Order             Diet Carb Modified Fluid consistency: Thin; Room service appropriate? Yes  Diet effective now                    Data Reviewed: I have personally reviewed following labs and imaging studies ( see epic result tab) CBC: Recent Labs  Lab 08/30/24 1334 09/01/24 0323 09/02/24 0317  WBC 8.3 9.4 10.7*  HGB 13.8 11.0* 10.1*  HCT 42.3 33.6* 30.0*  MCV 91.8 92.3 90.9  PLT 188 114* 101*   CMP: Recent Labs  Lab 08/30/24 1334 08/31/24 0453 09/01/24 0323 09/02/24 0317  NA 140 138 137 136  K 4.0 3.5 3.8 3.5  CL 104 105 101 103  CO2 25 23 24 23   GLUCOSE 127* 149* 129* 138*  BUN 12 15 15 17   CREATININE 1.55* 1.69* 1.77* 1.73*  CALCIUM  9.6 8.9 8.7* 8.3*   GFR: Estimated Creatinine Clearance: 50.4 mL/min (A) (by C-G formula based on SCr of 1.73 mg/dL (H)). No results for input(s): AST, ALT, ALKPHOS, BILITOT, PROT, ALBUMIN in the last 168 hours. No results for input(s): LIPASE, AMYLASE in the last 168 hours. No results for input(s): AMMONIA in the last 168 hours. Coagulation Profile: No results for input(s): INR, PROTIME in the last 168 hours. Unresulted Labs (From admission, onward)     Start     Ordered   09/02/24 0915  Multiple Myeloma Panel (SPEP&IFE w/QIG)  Once,   R        09/02/24 0914   09/01/24 0500  Parathyroid hormone, intact (no Ca)  Tomorrow morning,   R        08/31/24 1512   09/01/24 0500  Testosterone , free  (Testosterone , Free,  Total (PNL))  Tomorrow morning,   R        08/31/24 1512   09/01/24 0500  Testosterone , % free  (Testosterone , Free, Total (PNL))  Tomorrow morning,   R        08/31/24 1512   09/01/24 0500  Sex hormone binding globulin  (Testosterone , Free, Total (PNL))  Tomorrow morning,   R        08/31/24 1512           Antimicrobials/Microbiology: Anti-infectives (From admission, onward)    Start     Dose/Rate Route Frequency Ordered Stop   08/31/24 1500  ceFAZolin (ANCEF) IVPB 2g/100 mL premix        2  g 200 mL/hr over 30 Minutes Intravenous Every 6 hours 08/31/24 1133 08/31/24 2145   08/31/24 0745  ceFAZolin (ANCEF) IVPB 2g/100 mL premix        2 g 200 mL/hr over 30 Minutes Intravenous To ShortStay Surgical 08/30/24 1912 08/31/24 0913      No results found for: SDES, SPECREQUEST, CULT, REPTSTATUS  Procedures: Procedure(s) (LRB): RETROGRADE INTRAMEDULLARY NAILING OF RIGHT FEMUR (Right)   Mennie LAMY, MD [facility] 09/02/2024, 11:32 AM

## 2024-09-03 ENCOUNTER — Other Ambulatory Visit (HOSPITAL_COMMUNITY): Payer: Self-pay

## 2024-09-03 LAB — CBC WITH DIFFERENTIAL/PLATELET
Abs Immature Granulocytes: 0.05 K/uL (ref 0.00–0.07)
Basophils Absolute: 0 K/uL (ref 0.0–0.1)
Basophils Relative: 1 %
Eosinophils Absolute: 0.4 K/uL (ref 0.0–0.5)
Eosinophils Relative: 5 %
HCT: 30.8 % — ABNORMAL LOW (ref 39.0–52.0)
Hemoglobin: 10.4 g/dL — ABNORMAL LOW (ref 13.0–17.0)
Immature Granulocytes: 1 %
Lymphocytes Relative: 17 %
Lymphs Abs: 1.5 K/uL (ref 0.7–4.0)
MCH: 30.6 pg (ref 26.0–34.0)
MCHC: 33.8 g/dL (ref 30.0–36.0)
MCV: 90.6 fL (ref 80.0–100.0)
Monocytes Absolute: 0.7 K/uL (ref 0.1–1.0)
Monocytes Relative: 8 %
Neutro Abs: 6 K/uL (ref 1.7–7.7)
Neutrophils Relative %: 68 %
Platelets: 118 K/uL — ABNORMAL LOW (ref 150–400)
RBC: 3.4 MIL/uL — ABNORMAL LOW (ref 4.22–5.81)
RDW: 13.6 % (ref 11.5–15.5)
WBC: 8.8 K/uL (ref 4.0–10.5)
nRBC: 0 % (ref 0.0–0.2)

## 2024-09-03 LAB — GLUCOSE, CAPILLARY
Glucose-Capillary: 144 mg/dL — ABNORMAL HIGH (ref 70–99)
Glucose-Capillary: 160 mg/dL — ABNORMAL HIGH (ref 70–99)
Glucose-Capillary: 130 mg/dL — ABNORMAL HIGH (ref 70–99)

## 2024-09-03 LAB — BASIC METABOLIC PANEL WITH GFR
Anion gap: 10 (ref 5–15)
BUN: 13 mg/dL (ref 8–23)
CO2: 23 mmol/L (ref 22–32)
Calcium: 8.5 mg/dL — ABNORMAL LOW (ref 8.9–10.3)
Chloride: 105 mmol/L (ref 98–111)
Creatinine, Ser: 1.54 mg/dL — ABNORMAL HIGH (ref 0.61–1.24)
GFR, Estimated: 50 mL/min — ABNORMAL LOW (ref >60)
Glucose, Bld: 173 mg/dL — ABNORMAL HIGH (ref 70–99)
Potassium: 3.6 mmol/L (ref 3.5–5.1)
Sodium: 138 mmol/L (ref 135–145)

## 2024-09-03 LAB — SURGICAL PATHOLOGY

## 2024-09-03 LAB — TESTOSTERONE, FREE: Testosterone, Free: 1.5 pg/mL — ABNORMAL LOW (ref 6.6–18.1)

## 2024-09-03 LAB — SEX HORMONE BINDING GLOBULIN: Sex Hormone Binding: 16 nmol/L — ABNORMAL LOW (ref 19.3–76.4)

## 2024-09-03 MED ORDER — CARVEDILOL 6.25 MG PO TABS
6.2500 mg | ORAL_TABLET | Freq: Two times a day (BID) | ORAL | 0 refills | Status: DC
  Filled 2024-09-03: qty 60, 30d supply, fill #0

## 2024-09-03 MED ORDER — AMLODIPINE BESYLATE 5 MG PO TABS
5.0000 mg | ORAL_TABLET | Freq: Every day | ORAL | 0 refills | Status: AC
  Filled 2024-09-03: qty 60, 60d supply, fill #0

## 2024-09-03 MED ORDER — METHOCARBAMOL 500 MG PO TABS
500.0000 mg | ORAL_TABLET | Freq: Four times a day (QID) | ORAL | 0 refills | Status: AC | PRN
  Filled 2024-09-03: qty 20, 5d supply, fill #0

## 2024-09-03 NOTE — Progress Notes (Signed)
 Orthopaedic Trauma Service Progress Note  Patient ID: Brendan [name] MRN: [id number] DOB/AGE: 1961-07-18 63 y.o.  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  Sounds as if he was previously seeing [facility] up until about a year ago had tried some testosterone  cream but states it was ineffective.  Also reports a history of vitamin D  deficiency but labs this admission showed good vitamin D  levels  PTH and TSH levels look appropriate  X-rays of left knee/distal femur do not show any lesions of concern  ROS As above  Today's  total administered Morphine Milligram Equivalents: 0 Yesterday's total administered Morphine Milligram Equivalents: 0  Objective:   VITALS:   Vitals:   09/02/24 1944 09/03/24 0504 09/03/24 0723 09/03/24 0748  BP:  113/76  122/67  Pulse: (!) 105 (!) 101  (!) 101  Resp:  18  17  Temp:  98.6 F (37 C)  98.7 F (37.1 C)  TempSrc:  Oral    SpO2:  97% 96% 97%  Weight:      Height:        Estimated body mass index is 29.84 kg/m as calculated from the following:   Height as of this encounter: 5' 10 (1.778 m).   Weight as of this encounter: 94.3 kg.   Intake/Output      11/06 0701 11/07 0700 11/07 0701 11/08 0700   P.O. 480 240   I.V. (mL/kg) 1531.9 (16.2)    IV Piggyback     Total Intake(mL/kg) 2011.9 (21.3) 240 (2.5)   Urine (mL/kg/hr) 3700 (1.6) 500 (1.2)   Stool 0    Total Output 3700 500   Net -1688.1 -260        Urine Occurrence 1 x    Stool Occurrence 1 x      LABS  Results for orders placed or performed during the hospital encounter of 08/30/24 (from the past 24 hours)  Glucose, capillary     Status: Abnormal   Collection Time: 09/02/24  4:29 PM  Result Value Ref Range    Glucose-Capillary 133 (H) 70 - 99 mg/dL  Glucose, capillary     Status: Abnormal   Collection Time: 09/02/24  8:50 PM  Result Value Ref Range   Glucose-Capillary 161 (H) 70 - 99 mg/dL  Glucose, capillary     Status: Abnormal   Collection Time: 09/03/24  6:33 AM  Result Value Ref Range   Glucose-Capillary 144 (H) 70 - 99 mg/dL     PHYSICAL EXAM:   Gen: Sitting up in chair, no acute distress, wife at bedside, very pleasant. Stood up 2x for me without assistance or difficulty  Lungs: Unlabored Ext:       Right Lower Extremity Dressings are stable             Dressings changed             All wounds look great             Scant drainage from anterior knee incision              New mepilex dressings applied  Swelling mild, he is wearing compression socks this morning  Extremity is warm             No DCT             Compartments are soft             No pain out of proportion with passive stretching of toes or ankle             DPN, SPN, TN sensory functions are intact             EHL, FHL, lesser toe motor functions intact             Ankle flexion, extension, inversion eversion intact             + DP pulse    Assessment/Plan: 3 Days Post-Op   Principal Problem:   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 Active Problems:   Coronary artery disease involving native coronary artery of native heart without angina pectoris   Displaced supracondylar fracture of distal end of right femur without intracondylar extension (HCC)   Anti-infectives (From admission, onward)    Start     Dose/Rate Route Frequency Ordered Stop   08/31/24 1500  ceFAZolin (ANCEF) IVPB 2g/100 mL premix        2 g 200 mL/hr over 30 Minutes Intravenous Every 6 hours 08/31/24 1133 08/31/24 2145   08/31/24 0745  ceFAZolin (ANCEF) IVPB 2g/100 mL premix        2 g 200 mL/hr over 30 Minutes Intravenous To ShortStay Surgical 08/30/24 1912 08/31/24 0913     .  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  Dressing changes as needed from here on out.  Can use 4 x 4 gauze and tape or silicone foam dressing such as Mepilex.  I did discuss with patient and wife that would expect a little more bruising given his Plavix  and aspirin  use   okay to leave open to air once there is no drainage and can shower once there is no drainage.  Clean with soap and water only.  Wound care was reviewed with patient and wife.  They are also a comprehensive wound care instruction in the discharge paperwork               Ice and elevate for swelling and pain control             Bone foam when not working on range of motion exercises to help prevent further knee contracture   PT- please teach HEP for right knee ROM- AROM, PROM. Prone exercises as well. No ROM restrictions.  Quad sets, SLR, LAQ, SAQ, heel slides, stretching, prone flexion and extension   Ankle theraband program, heel cord stretching, toe towel curls, etc   No pillows under bend of knee when at rest, ok to place under heel to help work on extension. Can also use zero knee bone foam if available DO NOT LET KNEE REST IN FLEXION!!!!!   -  Pain management:             Multimodal   - ABL anemia/Hemodynamics             Stable              Minimal blood loss intraoperatively   - Medical issues              Per primary   - DVT/PE prophylaxis:             Okay to resume aspirin  and Plavix  from Ortho standpoint     - ID:              Periop abx completed    - Metabolic Bone Disease:             Vitamin D  levels look good              Concerned that sarcoid could be contributing to a pathologic process in his distal femur.  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  Await daily components of the testosterone  panel but would recommend follow-up with urology to discuss alternative testosterone  supplementation   - Activity:             As above   - Impediments to fracture healing:             Fragility fracture right distal femur             H/o sarcoid   - Dispo:             Ortho issues are stable             Follow-up with orthopedics in 10 to 14 days for suture removal and follow-up x-rays     Francis MICAEL Mt, PA-C [phone] (C) 09/03/2024, 11:21 AM  [facility] [address] [place] [address] [phone] (F)    After 5pm and on the weekends please log on to Amion, go to orthopaedics and the look under the [facility] Call for the provider(s) on call. You can also call our office at [phone] and then follow the prompts to be connected to the call team.  Patient ID: Brendan [name], male   DOB: 12-Jan-1961, 63 y.o.   MRN: [id number]

## 2024-09-03 NOTE — Progress Notes (Signed)
 Physical Therapy Treatment Patient Details Name: Brendan [name] MRN: [id number] DOB: Mar 06, 1961 Today's Date: 09/03/2024   History of Present Illness 63 y.o. male presents to [facility] 08/30/24 after R leg gave out during ambulation and popping noise was heard. X-ray showed right periprosthetic distal femur fracture. S/p IM nailing of R femur 11/4. 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    PT Comments  Today's session focused on gait and stair training. Pt denied dizziness and lightheadedness throughout mobility. His BP remained stable throughout position changes. Pt ambulated ~66ft twice once with RW and the second time with crutches. He abided by RLE weight-bearing precautions throughout activity. Educated pt on stair training using RW and crutches. He completed 2 steps twice using each AD. Pt required CGA for safety/stability and minimal cues for sequencing. Pt feels ready and safe for discharge home with HHPT. I have answered all his questions related to mobility. Will continue to follow acutely and advance appropriately.      If plan is discharge home, recommend the following: A little help with walking and/or transfers;A little help with bathing/dressing/bathroom;Assistance with cooking/housework;Assist for transportation;Help with stairs or ramp for entrance   Can travel by private vehicle        Equipment Recommendations  Wheelchair (measurements PT);Wheelchair cushion (measurements PT);Rolling walker (2 wheels);Crutches;BSC/3in1    Recommendations for Other Services       Precautions / Restrictions Precautions Precautions: Fall Recall of Precautions/Restrictions: Intact Precaution/Restrictions Comments: Unrestricted ROM R hip and knee Restrictions Weight Bearing Restrictions Per Provider Order: Yes RLE Weight Bearing Per Provider Order: Touchdown weight bearing      Mobility  Bed Mobility               General bed mobility comments: Not assessed. Pt greeted seated in recliner and returned there at end of session.    Transfers Overall transfer level: Needs assistance Equipment used: Rolling walker (2 wheels), Crutches Transfers: Sit to/from Stand Sit to Stand: Contact guard assist   Step pivot transfers: Contact guard assist       General transfer comment: Pt completed sit<>stand from recliner chair using RW and crutches. He demonstrated proper hand placement and sequencing. Powered up with light assist. Cues to maintain weight-bearing status, with pt reporting all the weight was in his arms and only slight was on the RLE. Reminded him of TDWB and only to allow toes to rest on ground for steadying instead of foot flat. Transferred within room. Good eccentric control.    Ambulation/Gait Ambulation/Gait assistance: Contact guard assist, +2 safety/equipment (Chair Follow (not needed)) Gait Distance (Feet): 60 Feet (x2) Assistive device: Rolling walker (2 wheels), Crutches Gait Pattern/deviations: Step-to pattern, Decreased stride length Gait velocity: decreased Gait velocity interpretation: <1.8 ft/sec, indicate of risk for recurrent falls   General Gait Details: Pt ambulated initially using RW then switched to crutches. He maintain RLE NWB using a hopping technique using RW and swing-through with crutches. Pt navigated room/hallway well without LOB.   Stairs Stairs: Yes Stairs assistance: Contact guard assist Stair Management: Backwards, Forwards, Step to pattern, With walker, With crutches Number of Stairs: 2 (x2) General stair comments: Educated pt on stair training in accordance with his home set-up. Demonstrated multiple techniques. Instructed pt to ascend with LLE and descend with RLE. He declined to try using rails as he reports the ones at home are wobbly. Pt went bkwd/fwd using RW. He went fwds  using crutches. Cues for  sequencing. Discussed how his wife should be positioned for support. Provided education handouts on technique.   Wheelchair Mobility     Tilt Bed    Modified Rankin (Stroke Patients Only)       Balance Overall balance assessment: Needs assistance Sitting-balance support: Feet supported, Bilateral upper extremity supported Sitting balance-Leahy Scale: Fair     Standing balance support: Bilateral upper extremity supported, Reliant on assistive device for balance, Single extremity supported, During functional activity Standing balance-Leahy Scale: Poor Standing balance comment: Pt dependent on RW.                            Communication Communication Communication: No apparent difficulties  Cognition Arousal: Alert Behavior During Therapy: WFL for tasks assessed/performed   PT - Cognitive impairments: No apparent impairments                         Following commands: Intact      Cueing Cueing Techniques: Verbal cues  Exercises      General Comments General comments (skin integrity, edema, etc.): Pt denied dizziness and lightheadedness throughout session. BP: seated 112/77 (80), standing 107/69 (83)      Pertinent Vitals/Pain Pain Assessment Pain Assessment: Faces Faces Pain Scale: Hurts a little bit Pain Location: RLE Pain Descriptors / Indicators: Guarding, Discomfort, Aching Pain Intervention(s): Monitored during session, Repositioned    Home Living                          Prior Function            PT Goals (current goals can now be found in the care plan section) Acute Rehab PT Goals Patient Stated Goal: Return Home and regain independence PT Goal Formulation: With patient/family Time For Goal Achievement: 09/15/24 Potential to Achieve Goals: Good Progress towards PT goals: Progressing toward goals    Frequency    Min 2X/week      PT Plan      Co-evaluation              AM-PAC PT 6 Clicks  Mobility   Outcome Measure  Help needed turning from your back to your side while in a flat bed without using bedrails?: A Little Help needed moving from lying on your back to sitting on the side of a flat bed without using bedrails?: A Little Help needed moving to and from a bed to a chair (including a wheelchair)?: A Little Help needed standing up from a chair using your arms (e.g., wheelchair or bedside chair)?: A Little Help needed to walk in hospital room?: A Little Help needed climbing 3-5 steps with a railing? : A Little 6 Click Score: 18    End of Session Equipment Utilized During Treatment: Gait belt Activity Tolerance: Patient tolerated treatment well Patient left: in chair;with call bell/phone within reach;with family/visitor present Nurse Communication: Mobility status PT Visit Diagnosis: Difficulty in walking, not elsewhere classified (R26.2);Other abnormalities of gait and mobility (R26.89);Unsteadiness on feet (R26.81)     Time: 8784-8754 PT Time Calculation (min) (ACUTE ONLY): 30 min  Charges:    $Gait Training: 23-37 mins PT General Charges $$ ACUTE PT VISIT: 1 Visit                     Randall SAUNDERS, PT, DPT Acute Rehabilitation Services Office: [phone] Secure Chat Preferred  Delon HERO Shawndrea Rutkowski 09/03/2024, 2:42 PM

## 2024-09-03 NOTE — Progress Notes (Signed)
 Delay   Patient requesting wheelchair and states that it can be delivered to his home address.  Ben RN notified and AVS printed and placed in chart. Aware to review and update AVS as needed.   PIV removed pressure dressing applied.   DME at bedside.      Secure chat sent to team regarding DME request by [facility].

## 2024-09-03 NOTE — Discharge Summary (Signed)
 Physician Discharge Summary  Brendan [name] FMW:[id number] DOB: 06-Feb-1961 DOA: 08/30/2024  PCP: [name] LABOR, PA-C  Admit date: 08/30/2024 Discharge date: 09/03/2024  Admitted From: Home Disposition: Home  Recommendations for Outpatient Follow-up:  Follow up with PCP in 1 week .  Follow-up on myeloma panel.  Also testosterone  level low, referral placed to urology Postsurgical follow-up with orthopedics in 1 to 2 weeks.   Follow up in ED if symptoms worsen or new appear   Home Health: No Equipment/Devices: None  Discharge Condition: Stable CODE STATUS: Full Diet recommendation: Heart healthy  Brief/Interim Summary:   Brendan [name] is a 63 y.o. male with PMH of with PMH of  CVA on plavix , history of sarcoidosis, diabetes mellitus, left knee replacement, hyperlipidemia, hypertension, diabetes mellitus on insulin  presented after hearing a pop on the right knee while stepping down and not able to stop himself from falling, sat down for a while and tried to get up but felt like his knee was unstable and did not put any weight and brought to the ED. In the ED hemodynamically stable, labs with creatinine 1.5, x-ray showed right periprosthetic distal femur fracture and orthopedic was consulted and admission requested under medicine service for further management. Patient underwent IM nailing right femur 11/4 postop fairly doing okay   Assessment and plan:   Right periprosthetic distal femur fracture, concern for pathological fracture. History of right knee replacement: S/P IM nailing of the right femur 11/4.  Doing well postoperatively Continue aspirin  and Plavix  for DVT prophylaxis.  Continue PT OT pain management Okay for unrestricted range of motion of the knee and hip early mobilization and touchdown weightbearing on right leg. Biopsy is negative for sarcoid. Myeloma panel pending. Testosterone  level is low.  Referral placed for follow-up with urology.   Patient  well-controlled with medications  Recent PFO closure CVA on plavix /aspirin  CAD HLD: Cardiology input appreciated.  Started on Coreg, continue aspirin  Plavix  statin.    Orthostatic hypotension: History of hernia, exhausted antihypertensives.  Amlodipine decreased to 5 mg daily.  Hypertension: Continue Coreg. Amlodipine on hold   Fever episode 10/4 night: Has been afebrile more than 48 hours no signs of infections otherwise.   CKD stage IIIa: Baseline creatinine around 1.5, stable to slightly worse encourage oral hydration.Continue to monitor   history of sarcoidosis Stable.   Diabetes mellitus on insulin : Blood sugar well-controlled -on sliding scale insulin    Patient discharged home with home health services. 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 Active Problems:   Coronary artery disease involving native coronary artery of native heart without angina pectoris   Displaced supracondylar fracture of distal end of right femur without intracondylar extension [facility])    Discharge Instructions  Discharge Instructions     Ambulatory referral to Urology   Complete by: As directed    Low testosterone    Call MD for:  persistant dizziness or light-headedness   Complete by: As directed    Call MD for:  redness, tenderness, or signs of infection (pain, swelling, redness, odor or green/yellow discharge around incision site)   Complete by: As directed    Change dressing (specify)   Complete by: As directed    Dressing change: As needed.   Diet - low sodium heart healthy   Complete by: As directed    Discharge instructions   Complete by: As directed    1.  Please follow activity instructions as per orthopedics.  Follow-up with  orthopedics in 10 days to 14 days. 2.  Start taking carvedilol 6.25 mg twice daily for coronary artery disease.  Decrease amlodipine 5 mg twice daily to avoid excessive drop in blood  pressure.  Resume other home medications as you have been taking prior to admission. 3.  Follow-up with urology regarding low testosterone , referral placed   Increase activity slowly   Complete by: As directed       Allergies as of 09/03/2024       Reactions   Latex Swelling   Zestril [lisinopril] Swelling   Cozaar [losartan] Swelling   Glucophage [metformin] Nausea Only   Viagra [sildenafil] Palpitations, Other (See Comments)   Headache        Medication List     TAKE these medications    acetaminophen  325 MG tablet Commonly known as: TYLENOL  Take 2 tablets (650 mg total) by mouth every 6 (six) hours as needed for mild pain (pain score 1-3) or moderate pain (pain score 4-6).   albuterol 108 (90 Base) MCG/ACT inhaler Commonly known as: VENTOLIN HFA Inhale 1-2 puffs into the lungs every 6 (six) hours as needed for shortness of breath or wheezing.   amLODipine 5 MG tablet Commonly known as: NORVASC Take 1 tablet (5 mg total) by mouth daily. Start taking on: September 04, 2024 What changed:  medication strength how much to take   amoxicillin  500 MG capsule Commonly known as: AMOXIL  TAKE FOUR CAPSULES BY MOUTH ONCE 1 HOUR PRIOR TO THE DENTAL TREATMENT   aspirin  EC 81 MG tablet Take 1 tablet (81 mg total) by mouth daily. Swallow whole.   atorvastatin  80 MG tablet Commonly known as: LIPITOR  Take 80 mg by mouth at bedtime.   carvedilol 6.25 MG tablet Commonly known as: COREG Take 1 tablet (6.25 mg total) by mouth 2 (two) times daily with a meal.   clobetasol cream 0.05 % Commonly known as: TEMOVATE Apply 1 Application topically daily as needed (Psoriasis).   clopidogrel  75 MG tablet Commonly known as: PLAVIX  Take 1 tablet (75 mg total) by mouth daily.   diclofenac Sodium 1 % Gel Commonly known as: VOLTAREN Apply 2 g topically 2 (two) times daily as needed (shoulder pain).   donepezil 10 MG tablet Commonly known as: ARICEPT Take 10 mg by mouth at bedtime.    DULoxetine  30 MG capsule Commonly known as: CYMBALTA  Take 30 mg by mouth daily.   empagliflozin  25 MG Tabs tablet Commonly known as: JARDIANCE  Take 1 tablet (25 mg total) by mouth daily. What changed: how much to take   escitalopram  10 MG tablet Commonly known as: LEXAPRO  Take 20 mg by mouth at bedtime.   ezetimibe  10 MG tablet Commonly known as: ZETIA  Take 10 mg by mouth at bedtime.   methocarbamol 500 MG tablet Commonly known as: ROBAXIN Take 1 tablet (500 mg total) by mouth every 6 (six) hours as needed for muscle spasms.   mometasone -formoterol  100-5 MCG/ACT Aero Commonly known as: DULERA Inhale 2 puffs into the lungs in the morning and at bedtime. What changed:  when to take this reasons to take this   oxyCODONE 5 MG immediate release tablet Commonly known as: Oxy IR/ROXICODONE Take 1 tablet (5 mg total) by mouth every 6 (six) hours as needed for severe pain (pain score 7-10).   pantoprazole  40 MG tablet Commonly known as: Protonix  Take 1 tablet (40 mg total) by mouth daily.   Semaglutide (0.25 or 0.5MG /DOS) 2 MG/3ML Sopn Inject 0.5 mg into the skin every Sunday.  sodium fluoride  1.1 % Gel dental gel Commonly known as: FLUORISHIELD Place 1 Application onto teeth at bedtime.   traZODone 100 MG tablet Commonly known as: DESYREL Take 100 mg by mouth at bedtime as needed for sleep.   Vitamin D  (Ergocalciferol ) 1.25 MG (50000 UNIT) Caps capsule Commonly known as: DRISDOL  Take 50,000 Units by mouth every Monday.   zolpidem 10 MG tablet Commonly known as: AMBIEN Take 10 mg by mouth at bedtime as needed for sleep.               Durable Medical Equipment  (From admission, onward)           Start     Ordered   09/01/24 1848  For home use only DME Walker rolling  Once       Question Answer Comment  Walker: With 5 Inch Wheels   Patient needs a walker to treat with the following condition Fx      09/01/24 1848   09/01/24 1848  For home use only  DME Bedside commode  Once       Question:  Patient needs a bedside commode to treat with the following condition  Answer:  Fx   09/01/24 1848   09/01/24 1848  For home use only DME Crutches  Once        09/01/24 1848              Discharge Care Instructions  (From admission, onward)           Start     Ordered   09/03/24 0000  Change dressing (specify)       Comments: Dressing change: As needed.   09/03/24 1310            Contact information for follow-up providers     Saul Kubas A, PA-C Follow up.   Specialty: Physician Assistant Contact information: [address] [place] [address] [phone]         [facility]) Follow up.   Specialty: Home Health Services Why: Home health services will be provided by [facility] , start of care within 48 hours post discharge Contact information: [address] [place]  [address] [phone]        Celena Sharper, MD. Schedule an appointment as soon as possible for a visit in 2 week(s).   Specialty: Orthopedic Surgery Contact information: [address] [place] [address] [phone]         [facility]. Schedule an appointment as soon as possible for a visit in 1 week(s).   Why: eval and treat low testosterone  Contact information: [address] [place]  [address] [phone]             Contact information for after-discharge care     Durable Medical Equipment     [facility] (DME) .   Service: Durable Medical Equipment Contact information: [address] [place]  [address] [phone]             Home Medical Care     [facility]) .   Service: Home Health Services Contact information: [address] [place]  [address] [phone]                     Allergies  Allergen Reactions   Latex Swelling   Zestril [Lisinopril] Swelling  Cozaar [Losartan] Swelling   Glucophage [Metformin] Nausea Only   Viagra [Sildenafil] Palpitations and Other (See Comments)    Headache    Consultations:    Procedures/Studies: DG Knee Left Port Result Date: 09/02/2024 CLINICAL DATA:  Left knee pain.  History of left knee replacement. EXAM: DG KNEE 1-2V PORT*L* COMPARISON:  None available. FINDINGS: Total knee arthroplasty in expected alignment. No periprosthetic lucency. No acute or periprosthetic fracture. Prior patellar resurfacing. No erosive or bony destructive change. Trace joint effusion. Mild generalized soft tissue edema. IMPRESSION: 1. Total knee arthroplasty without complication. 2. Trace joint effusion. Mild generalized soft tissue edema. Electronically Signed   By: Andrea Gasman M.D.   On: 09/02/2024 15:44   DG FEMUR PORT, MIN 2 VIEWS RIGHT Result Date: 08/31/2024 EXAM: 2 VIEW(S) XRAY OF THE RIGHT FEMUR 08/31/2024 12:11:01 PM COMPARISON: Radiographs 08/30/2024 previous right knee replacement. CLINICAL HISTORY: 03948 Fracture [id number] Fracture [id number]. FINDINGS: BONES AND JOINTS: Interval intramedullary rodding of the right femur with proximal and distal screw fixation. Acute comminuted distal femoral periprosthetic fracture with mild residual displacement. Previous right knee replacement. No joint dislocation. SOFT TISSUES: Vascular calcifications. Gas in the soft tissues consistent with recent surgery. IMPRESSION: 1. Status post intramedullary rodding of the right femur for comminuted distal femoral periprosthetic fracture. Electronically signed by: Luke Bun MD 08/31/2024 06:17 PM EST RP Workstation: [id number]   DG FEMUR, MIN 2 VIEWS RIGHT Result Date: 08/31/2024 EXAM: Multiple low-resolution intraoperative spot views of the right femur. 08/31/2024 10:11:00 AM COMPARISON: CT 08/30/2024, radiograph 08/30/2024. CLINICAL HISTORY: 461500 Elective  surgery 461500 Elective surgery [phone]. FINDINGS: BONES AND JOINTS: Previous right knee replacement. Intraoperative intramedullary rodding and screw fixation of comminuted distal femoral periprosthetic fracture. Total fluoroscopy time was 1 minute 27 seconds, fluoroscopic dose of 16.12 mgy. IMPRESSION: 1. Intraoperative fluoroscopic assistance provided during surgical fixation of distal femoral fracture Electronically signed by: Luke Bun MD 08/31/2024 06:15 PM EST RP Workstation: [id number]   DG HIP UNILAT WITH PELVIS 2-3 VIEWS RIGHT Result Date: 08/31/2024 EXAM: 2 OR MORE VIEW(S) XRAY OF THE UNILATERAL HIP 08/30/2024 07:57:00 PM COMPARISON: None available. CLINICAL HISTORY: Fracture [id number] FINDINGS: BONES AND JOINTS: No acute fracture or focal osseous lesion. The hip joint is maintained. No significant degenerative changes. SOFT TISSUES: Scrotal and pelvic phleboliths and vascular calcifications. IMPRESSION: 1. No acute osseous abnormality of the hip. Electronically signed by: Rockey Kilts MD 08/31/2024 02:50 PM EST RP Workstation: [id number]   DG C-Arm 1-60 Min-No Report Result Date: 08/31/2024 Fluoroscopy was utilized by the requesting physician.  No radiographic interpretation.   DG C-Arm 1-60 Min-No Report Result Date: 08/31/2024 Fluoroscopy was utilized by the requesting physician.  No radiographic interpretation.   CT KNEE RIGHT WO CONTRAST Result Date: 08/30/2024 CLINICAL DATA:  Injury, right periprosthetic femoral fracture EXAM: CT OF THE RIGHT KNEE WITHOUT CONTRAST TECHNIQUE: Multidetector CT imaging of the right knee was performed according to the standard protocol. Multiplanar CT image reconstructions were also generated. RADIATION DOSE REDUCTION: This exam was performed according to the departmental dose-optimization program which includes automated exposure control, adjustment of the mA and/or kV according to patient size and/or use of iterative reconstruction technique. COMPARISON:   08/30/2024 FINDINGS: Bones/Joint/Cartilage There is a comminuted impacted fracture of the distal right femoral metaphysis abutting the femoral component of the right knee arthroplasty. There is slight ventral angulation at the fracture site. Otherwise alignment is near anatomic. No other acute displaced fractures are identified. The prostheses components appear in the expected location without evidence of hardware failure.  Small right knee effusion. Ligaments Suboptimally assessed by CT. Muscles and Tendons No evidence of acute muscular injury. Intramuscular edema is seen within the quadriceps just proximal to the knee. Soft tissues Marked edema in the soft tissues surrounding the distal femoral fracture. No fluid collection or hematoma. Atherosclerosis of the right superficial femoral and popliteal arteries. Reconstructed images demonstrate no additional findings. IMPRESSION: 1. Comminuted periprosthetic distal right femoral metaphyseal fracture, with slight impaction and ventral angulation at the fracture site. 2. Small joint effusion. 3. Soft tissue swelling surrounding the right femoral fracture. No fluid collection or hematoma. Electronically Signed   By: Ozell Daring M.D.   On: 08/30/2024 20:02   DG Knee Complete 4 Views Right Result Date: 08/30/2024 CLINICAL DATA:  injury. EXAM: RIGHT KNEE - COMPLETE 4+ VIEW COMPARISON:  None Available. FINDINGS: No acute fracture or dislocation. No aggressive osseous lesion. Redemonstration of right total knee arthroplasty with patellar resurfacing. There is comminuted fracture of the distal right femur just above the femoral implant. There is associated small left suprapatellar knee joint effusion. No focal soft tissue swelling. No radiopaque foreign bodies. IMPRESSION: *Comminuted fracture of the distal right femur just above the femoral implant. Electronically Signed   By: Ree Molt M.D.   On: 08/30/2024 13:52   ECHOCARDIOGRAM LIMITED Result Date: 08/05/2024     ECHOCARDIOGRAM LIMITED REPORT   Patient Name:   AYCE PIETRZYK Date of Exam: 08/05/2024 Medical Rec #:  [id number]             Height:       70.0 in Accession #:    [id number]            Weight:       209.0 lb Date of Birth:  1961-01-23             BSA:          2.127 m Patient Age:    63 years              BP:           134/86 mmHg Patient Gender: M                     HR:           73 bpm. Exam Location:  Inpatient Procedure: Limited Echo and Limited Color Doppler Indications:    Elvaluation for PFO / PFO closure  History:        Patient has prior history of Echocardiogram examinations.  Sonographer:    Charmaine Gaskins Referring Phys: [id number] ARUN K THUKKANI IMPRESSIONS  1. Left ventricular ejection fraction, by estimation, is 55 to 60%. The left ventricle has normal function. The left ventricle has no regional wall motion abnormalities. There is mild concentric left ventricular hypertrophy. Left ventricular diastolic function could not be evaluated.  2. Right ventricular systolic function is normal. The right ventricular size is normal.  3. Amplatzer PFO closure device in place and well situated with no evidence of residual shunting.  4. The mitral valve is normal in structure. No evidence of mitral valve regurgitation. No evidence of mitral stenosis.  5. The aortic valve is tricuspid. There is mild calcification of the aortic valve. Aortic valve regurgitation is not visualized. No aortic stenosis is present. FINDINGS  Left Ventricle: Left ventricular ejection fraction, by estimation, is 55 to 60%. The left ventricle has normal function. The left ventricle has no regional wall motion abnormalities. The left ventricular internal cavity size  was normal in size. There is  mild concentric left ventricular hypertrophy. Left ventricular diastolic function could not be evaluated. Right Ventricle: The right ventricular size is normal. No increase in right ventricular wall thickness. Right ventricular systolic  function is normal. Left Atrium: Left atrial size was normal in size. Right Atrium: Right atrial size was normal in size. Pericardium: There is no evidence of pericardial effusion. Mitral Valve: The mitral valve is normal in structure. No evidence of mitral valve stenosis. Tricuspid Valve: The tricuspid valve is normal in structure. Tricuspid valve regurgitation is trivial. No evidence of tricuspid stenosis. Aortic Valve: The aortic valve is tricuspid. There is mild calcification of the aortic valve. Aortic valve regurgitation is not visualized. No aortic stenosis is present. Pulmonic Valve: The pulmonic valve was not assessed. Pulmonic valve regurgitation is not visualized. No evidence of pulmonic stenosis. Aorta: The aortic root is normal in size and structure. Venous: The inferior vena cava was not well visualized. IAS/Shunts: No atrial level shunt detected by color flow Doppler. LEFT VENTRICLE PLAX 2D LVIDd:         5.40 cm LVIDs:         3.30 cm LV PW:         1.10 cm LV IVS:        1.20 cm  Toribio Fuel MD Electronically signed by Toribio Fuel MD Signature Date/Time: 08/05/2024/3:26:34 PM    Final    CARDIAC CATHETERIZATION Addendum Date: 08/05/2024 1.  Successful PFO closure with 30 mm Gore cardio form device with ICE imaging. Recommendations: Dual platelet therapy with Plavix  for 6 months then aspirin  monotherapy indefinitely.  Antibiotics for any dental work including cleanings for the next 6 months.  4 hours bedrest and limited echo prior to discharge.  Addendum Date: 08/05/2024 1.  Successful PFO closure with 30 mm Gore cardio form device with ICE imaging. Recommendations: Dual platelet therapy with Plavix  for 6 months then aspirin  monotherapy indefinitely.  Antibiotics for any dental work including cleanings for the next 6 months.  4 hours bedrest and limited echo prior to discharge.  Result Date: 08/05/2024 1.  Successful PFO closure with 30 mm Gore cardio form device. Recommendations:  Dual platelet therapy with Plavix  for 6 months then aspirin  monotherapy indefinitely.  Antibiotics for any dental work including cleanings for the next 6 months.  4 hours bedrest and limited echo prior to discharge.      Subjective:   Discharge Exam: Vitals:   09/03/24 0748 09/03/24 1453  BP: 122/67 137/81  Pulse: (!) 101 (!) 103  Resp: 17 17  Temp: 98.7 F (37.1 C) 98.4 F (36.9 C)  SpO2: 97% 96%    General: Pt is alert, awake, not in acute distress Cardiovascular: rate controlled, S1/S2 + Respiratory: bilateral decreased breath sounds at bases Abdominal: Soft, NT, ND, bowel sounds + Extremities: no edema, no cyanosis    The results of significant diagnostics from this hospitalization (including imaging, microbiology, ancillary and laboratory) are listed below for reference.     Microbiology: Recent Results (from the past 240 hours)  Surgical pcr screen     Status: None   Collection Time: 08/30/24  6:03 PM   Specimen: Nasal Mucosa; Nasal Swab  Result Value Ref Range Status   MRSA, PCR NEGATIVE NEGATIVE Final   Staphylococcus aureus NEGATIVE NEGATIVE Final    Comment: (NOTE) The Xpert SA Assay (FDA approved for NASAL specimens in patients 101 years of age and older), is one component of a comprehensive surveillance program.  It is not intended to diagnose infection nor to guide or monitor treatment. Performed at [facility], [address] [place], [place] [address]      Labs: BNP (last 3 results) No results for input(s): BNP in the last 8760 hours. Basic Metabolic Panel: Recent Labs  Lab 08/30/24 1334 08/31/24 0453 09/01/24 0323 09/02/24 0317 09/03/24 1037  NA 140 138 137 136 138  K 4.0 3.5 3.8 3.5 3.6  CL 104 105 101 103 105  CO2 25 23 24 23 23   GLUCOSE 127* 149* 129* 138* 173*  BUN 12 15 15 17 13   CREATININE 1.55* 1.69* 1.77* 1.73* 1.54*  CALCIUM  9.6 8.9 8.7* 8.3* 8.5*   Liver Function Tests: No results for input(s): AST, ALT,  ALKPHOS, BILITOT, PROT, ALBUMIN in the last 168 hours. No results for input(s): LIPASE, AMYLASE in the last 168 hours. No results for input(s): AMMONIA in the last 168 hours. CBC: Recent Labs  Lab 08/30/24 1334 09/01/24 0323 09/02/24 0317 09/03/24 1037  WBC 8.3 9.4 10.7* 8.8  NEUTROABS  --   --   --  6.0  HGB 13.8 11.0* 10.1* 10.4*  HCT 42.3 33.6* 30.0* 30.8*  MCV 91.8 92.3 90.9 90.6  PLT 188 114* 101* 118*   Cardiac Enzymes: No results for input(s): CKTOTAL, CKMB, CKMBINDEX, TROPONINI in the last 168 hours. BNP: Invalid input(s): POCBNP CBG: Recent Labs  Lab 09/02/24 1114 09/02/24 1629 09/02/24 2050 09/03/24 0633 09/03/24 1131  GLUCAP 178* 133* 161* 144* 160*   D-Dimer No results for input(s): DDIMER in the last 72 hours. Hgb A1c No results for input(s): HGBA1C in the last 72 hours. Lipid Profile No results for input(s): CHOL, HDL, LDLCALC, TRIG, CHOLHDL, LDLDIRECT in the last 72 hours. Thyroid  function studies Recent Labs    09/01/24 0323  TSH 0.750   Anemia work up No results for input(s): VITAMINB12, FOLATE, FERRITIN, TIBC, IRON, RETICCTPCT in the last 72 hours. Urinalysis    Component Value Date/Time   COLORURINE YELLOW 01/01/2021 1248   APPEARANCEUR CLEAR 01/01/2021 1248   LABSPEC 1.023 01/01/2021 1248   PHURINE 5.0 01/01/2021 1248   GLUCOSEU 50 (A) 01/01/2021 1248   HGBUR NEGATIVE 01/01/2021 1248   BILIRUBINUR NEGATIVE 01/01/2021 1248   KETONESUR NEGATIVE 01/01/2021 1248   PROTEINUR NEGATIVE 01/01/2021 1248   UROBILINOGEN 1.0 04/03/2008 2206   NITRITE NEGATIVE 01/01/2021 1248   LEUKOCYTESUR NEGATIVE 01/01/2021 1248   Sepsis Labs Recent Labs  Lab 08/30/24 1334 09/01/24 0323 09/02/24 0317 09/03/24 1037  WBC 8.3 9.4 10.7* 8.8   Microbiology Recent Results (from the past 240 hours)  Surgical pcr screen     Status: None   Collection Time: 08/30/24  6:03 PM   Specimen: Nasal Mucosa; Nasal  Swab  Result Value Ref Range Status   MRSA, PCR NEGATIVE NEGATIVE Final   Staphylococcus aureus NEGATIVE NEGATIVE Final    Comment: (NOTE) The Xpert SA Assay (FDA approved for NASAL specimens in patients 9 years of age and older), is one component of a comprehensive surveillance program. It is not intended to diagnose infection nor to guide or monitor treatment. Performed at [facility], [address], [place], [place] [address]      Time coordinating discharge: 35 minutes  SIGNED:   Derryl Duval, MD  [facility] 09/03/2024, 4:07 PM

## 2024-09-03 NOTE — TOC Transition Note (Addendum)
 Transition of Care [facility]) - Discharge Note   Patient Details  Name: Brendan [name] MRN: [id number] Date of Birth: [birth year]  Transition of Care [facility]) CM/SW Contact:  Sudie Erminio Deems, RN Phone Number: 09/03/2024, 12:17 PM   Clinical Narrative:  ICM spoke with patient regarding DME- patient has bsc, crutches, and rolling walker in the room. Patient declined the need for a wheelchair. Patient has PCP Alisa Bruch at the [facility] and a PCP in [place]. Patient wants to research a PCP- he can call his insurance to see providers in the area that are in network. No further needs identified at this time.   [id number] 09-03-2024 Patient is now wanting wheelchair. [place]- [place] and ICM will have to work with the [place] on Monday regarding DME. MD and staff Rn are aware.   Final next level of care: Home w Home Health Services Barriers to Discharge: No Barriers Identified   Patient Goals and CMS Choice     Choice offered to / list presented to : Patient (Pt without preference for home health and DME needs)  Discharge Plan and Services Additional resources added to the After Visit Summary for     Discharge Planning Services: CM Consult    HH Arranged: [facility] Agency: [facility] Date [facility] Agency Contacted: 09/01/24 Time HH Agency Contacted: 1850 Representative spoke with at [facility] Agency: [place]  Social Drivers of Health (SDOH) Interventions SDOH Screenings   Food Insecurity: No Food Insecurity (08/31/2024)  Housing: Low Risk  (08/31/2024)  Transportation Needs: No Transportation Needs (08/31/2024)  Utilities: Not At Risk (08/31/2024)  Depression (PHQ2-9): Low Risk  (04/02/2023)  Recent Concern: Depression (PHQ2-9) - High Risk (01/07/2023)  Financial Resource Strain: Low Risk  (03/24/2024)   Received from [facility]  Social Connections: Unknown (02/27/2022)   Received from [facility]  Tobacco Use: Low Risk  (08/31/2024)    Readmission Risk Interventions     No data to display

## 2024-09-06 NOTE — Care Management (Addendum)
 09-06-24 10:05 Inpatient Case Manager attempted to get wheelchair via [employer] and no orders were submitted in EPIC before the patient was discharged. ICM did call the [facility] regarding DME WC at [phone] ex [phone]. ICM was able to leave a voicemail and awaiting call back regarding DME.   1220 09-06-24 [facility] and [facility] [phone]. [facility] will have [facility] see if [facility] for the D[facility] moving forward to see if can be delivered.

## 2024-09-07 ENCOUNTER — Ambulatory Visit: Admitting: Podiatry

## 2024-09-07 LAB — MULTIPLE MYELOMA PANEL, SERUM
Albumin SerPl Elph-Mcnc: 3.3 g/dL (ref 2.9–4.4)
Albumin/Glob SerPl: 1.2 (ref 0.7–1.7)
Alpha 1: 0.4 g/dL (ref 0.0–0.4)
Alpha2 Glob SerPl Elph-Mcnc: 0.8 g/dL (ref 0.4–1.0)
B-Globulin SerPl Elph-Mcnc: 1.1 g/dL (ref 0.7–1.3)
Gamma Glob SerPl Elph-Mcnc: 0.8 g/dL (ref 0.4–1.8)
Globulin, Total: 3 g/dL (ref 2.2–3.9)
IgA: 349 mg/dL (ref 61–437)
IgG (Immunoglobin G), Serum: 1013 mg/dL (ref 603–1613)
IgM (Immunoglobulin M), Srm: 21 mg/dL (ref 20–172)
Total Protein ELP: 6.3 g/dL (ref 6.0–8.5)

## 2024-09-10 LAB — TESTOSTERONE, % FREE: Testosterone-% Free: 2.4 % — ABNORMAL HIGH (ref 0.2–0.7)

## 2024-09-13 ENCOUNTER — Ambulatory Visit: Attending: Physician Assistant | Admitting: Physician Assistant

## 2024-09-13 VITALS — BP 102/62 | HR 101 | Ht 69.0 in | Wt 208.4 lb

## 2024-09-13 DIAGNOSIS — Z8774 Personal history of (corrected) congenital malformations of heart and circulatory system: Secondary | ICD-10-CM | POA: Diagnosis not present

## 2024-09-13 DIAGNOSIS — E119 Type 2 diabetes mellitus without complications: Secondary | ICD-10-CM | POA: Diagnosis not present

## 2024-09-13 DIAGNOSIS — I639 Cerebral infarction, unspecified: Secondary | ICD-10-CM | POA: Diagnosis not present

## 2024-09-13 DIAGNOSIS — I25118 Atherosclerotic heart disease of native coronary artery with other forms of angina pectoris: Secondary | ICD-10-CM

## 2024-09-13 DIAGNOSIS — I152 Hypertension secondary to endocrine disorders: Secondary | ICD-10-CM

## 2024-09-13 DIAGNOSIS — E1159 Type 2 diabetes mellitus with other circulatory complications: Secondary | ICD-10-CM

## 2024-09-13 DIAGNOSIS — M9711XA 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, initial encounter: Secondary | ICD-10-CM

## 2024-09-13 DIAGNOSIS — D869 Sarcoidosis, unspecified: Secondary | ICD-10-CM

## 2024-09-13 NOTE — Patient Instructions (Signed)
 Medication Instructions:  Your physician recommends that you continue on your current medications as directed. Please refer to the Current Medication list given to you today.  STOP Plavix  on 02/03/2025 *If you need a refill on your cardiac medications before your next appointment, please call your pharmacy*  Lab Work: None needed If you have labs (blood work) drawn today and your tests are completely normal, you will receive your results only by: MyChart Message (if you have MyChart) OR A paper copy in the mail If you have any lab test that is abnormal or we need to change your treatment, we will call you to review the results.  Testing/Procedures: 08/08/2025 Your physician has requested that you have an echocardiogram. Echocardiography is a painless test that uses sound waves to create images of your heart. It provides your doctor with information about the size and shape of your heart and how well your heart's chambers and valves are working. This procedure takes approximately one hour. There are no restrictions for this procedure. Please do NOT wear cologne, perfume, aftershave, or lotions (deodorant is allowed). Please arrive 15 minutes prior to your appointment time.  Please note: We ask at that you not bring children with you during ultrasound (echo/ vascular) testing. Due to room size and safety concerns, children are not allowed in the ultrasound rooms during exams. Our front office staff cannot provide observation of children in our lobby area while testing is being conducted. An adult accompanying a patient to their appointment will only be allowed in the ultrasound room at the discretion of the ultrasound technician under special circumstances. We apologize for any inconvenience.   Follow-Up: At [facility], you and your health needs are our priority.  As part of our continuing mission to provide you with exceptional heart care, our providers are all part of one team.  This  team includes your primary Cardiologist (physician) and Advanced Practice Providers or APPs (Physician Assistants and Nurse Practitioners) who all work together to provide you with the care you need, when you need it.  Your next appointment:   As scheduled on 08/08/2024  Provider:   Izetta Hummer, PA-C  We recommend signing up for the patient portal called MyChart.  Sign up information is provided on this After Visit Summary.  MyChart is used to connect with patients for Virtual Visits (Telemedicine).  Patients are able to view lab/test results, encounter notes, upcoming appointments, etc.  Non-urgent messages can be sent to your provider as well.   To learn more about what you can do with MyChart, go to [email or web address].

## 2024-09-13 NOTE — Progress Notes (Signed)
 HEART AND VASCULAR CENTER   MULTIDISCIPLINARY HEART VALVE CLINIC                                     Cardiology Office Note:    Date:  09/13/2024   ID:  [name] Brendan Schools, DOB 1960-12-20, MRN [id number]  PCP:  Brendan Alisa LABOR, PA-C  [facility] Cardiologist:  Brendan Jordan, MD  [facility] Structural heart: Lurena MARLA Red, MD [facility] Electrophysiologist:  None   Referring MD: Brendan Alisa LABOR, PA-C   1 month s/p PFO closure  History of Present Illness:    Brendan [name] is a 63 y.o. male with a hx of CAD, HTN, HLD, DMT2, CKD stage IIIa, pulmonary sarcoidosis, recurrent CVAs and PFO s/p PFO closure 08/05/24 who presents to clinic for follow up.   Although the patient has multiple other risk factors for stroke, all are appropriately managed and he has had recurrent CVAs. DVT study negative. Outpatient monitor negative for afib. TEE showed PFO with high risk anatomy with an atrial septum is aneurysmal more than 2 cm excursion. S/p successful PFO closure with 30 mm Gore cardio form device on 08/05/24.  He has a history of bilateral TKRs. He was recently admitted for a non traumatic right comminuted fracture of the distal right femur just above the femoral implant s/p IM nailing of the right femur 08/31/24. Myeloma panel normal and surgical pathology was normal.   Today the patient presents to clinic for follow up. Here with his wife. No CP or SOB. No LE edema, orthopnea or PND. No dizziness or syncope. No blood in stool or urine. No palpitations. He does have right leg pain and swelling related to recent surgery.    Past Medical History:  Diagnosis Date   Coronary artery disease    Diabetes mellitus without complication (HCC)    Hyperlipidemia    Hypertension    Kidney disease    Sarcoidosis    Sarcoidosis    Stroke (HCC)      Current Medications: Current Meds  Medication Sig   acetaminophen  (TYLENOL ) 325 MG tablet Take 2 tablets (650 mg total) by mouth  every 6 (six) hours as needed for mild pain (pain score 1-3) or moderate pain (pain score 4-6).   albuterol (VENTOLIN HFA) 108 (90 Base) MCG/ACT inhaler Inhale 1-2 puffs into the lungs every 6 (six) hours as needed for shortness of breath or wheezing.   amLODipine (NORVASC) 5 MG tablet Take 1 tablet (5 mg total) by mouth daily.   amoxicillin  (AMOXIL ) 500 MG capsule TAKE FOUR CAPSULES BY MOUTH ONCE 1 HOUR PRIOR TO THE DENTAL TREATMENT   aspirin  EC 81 MG tablet Take 1 tablet (81 mg total) by mouth daily. Swallow whole.   atorvastatin  (LIPITOR ) 80 MG tablet Take 80 mg by mouth at bedtime.   carvedilol (COREG) 6.25 MG tablet Take 1 tablet (6.25 mg total) by mouth 2 (two) times daily with a meal.   clobetasol cream (TEMOVATE) 0.05 % Apply 1 Application topically daily as needed (Psoriasis).   clopidogrel  (PLAVIX ) 75 MG tablet Take 1 tablet (75 mg total) by mouth daily.   diclofenac Sodium (VOLTAREN) 1 % GEL Apply 2 g topically 2 (two) times daily as needed (shoulder pain).   donepezil (ARICEPT) 10 MG tablet Take 10 mg by mouth at bedtime.   empagliflozin  (JARDIANCE ) 25 MG TABS tablet Take 1 tablet (25 mg total)  by mouth daily. (Patient taking differently: Take 12.5 mg by mouth daily.)   escitalopram  (LEXAPRO ) 10 MG tablet Take 20 mg by mouth at bedtime.   ezetimibe  (ZETIA ) 10 MG tablet Take 10 mg by mouth at bedtime.   mometasone -formoterol  (DULERA) 100-5 MCG/ACT AERO Inhale 2 puffs into the lungs in the morning and at bedtime. (Patient taking differently: Inhale 2 puffs into the lungs 2 (two) times daily as needed for shortness of breath or wheezing.)   oxyCODONE (OXY IR/ROXICODONE) 5 MG immediate release tablet Take 1 tablet (5 mg total) by mouth every 6 (six) hours as needed for severe pain (pain score 7-10).   pantoprazole  (PROTONIX ) 40 MG tablet Take 1 tablet (40 mg total) by mouth daily.   Semaglutide ,0.25 or 0.5MG /DOS, 2 MG/3ML SOPN Inject 0.5 mg into the skin every Sunday.   sodium fluoride   (FLUORISHIELD) 1.1 % GEL dental gel Place 1 Application onto teeth at bedtime.   traZODone (DESYREL) 100 MG tablet Take 100 mg by mouth at bedtime as needed for sleep.   Vitamin D , Ergocalciferol , (DRISDOL ) 1.25 MG (50000 UNIT) CAPS capsule Take 50,000 Units by mouth every Monday.   zolpidem (AMBIEN) 10 MG tablet Take 10 mg by mouth at bedtime as needed for sleep.      ROS:   Please see the history of present illness.    All other systems reviewed and are negative.  EKGs       Risk Assessment/Calculations:            Physical Exam:    VS:  BP 102/62   Pulse (!) 101   Ht 5' 9 (1.753 m)   Wt 208 lb 6.4 oz (94.5 kg)   SpO2 95%   BMI 30.78 kg/m     Wt Readings from Last 3 Encounters:  09/13/24 208 lb 6.4 oz (94.5 kg)  08/30/24 208 lb (94.3 kg)  08/05/24 209 lb (94.8 kg)     GEN: Well nourished, well developed in no acute distress NECK: No JVD CARDIAC: RRR, no murmurs, rubs, gallops RESPIRATORY:  Clear to auscultation without rales, wheezing or rhonchi  ABDOMEN: Soft, non-tender, non-distended EXTREMITIES:  No edema; No deformity.    ASSESSMENT:    1. S/P percutaneous patent foramen ovale closure   2. Recurrent strokes (HCC)   3. Hypertension associated with diabetes (HCC)   4. Type 2 diabetes mellitus without complication, without long-term current use of insulin  (HCC)   5. Sarcoidosis   6. Coronary artery disease of native artery of native heart with stable angina pectoris   7. 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  -- Continue DAPT with Aspirin  81mg  daily and Plavix  75mg  daily x6 months followed by Aspirin  alone.  -- Plavix  stop date 02/03/25. -- Pt understands the need for SBE x 6 months. He already takes amoxicillin  for his knees.  -- I will see back in 1 year  with echo with bubble study.    Hx of recurrent CVAs: -- Continue antiplatelet therapy.  -- Continue atorvastatin  80mg  daily.   HTN: -- BP on soft side today but asymptomatic.  -- Continue amlodipine 10 mg. -- Intolerant of ACE or ARB.  DMT2: -- Continue Jardiance  12.5mg  daily and Ozempic  (holding for PFO closure). -- Intolerant of ACE or ARB.  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   CAD:  -- Cath 07/2022: 2 vessel obstructive CAD in two small diagonal vessels. Moderate disease in the mid LAD and mid LCx.  -- Continue medical therapy.  -- No chest pain.   Right femur fracture: -- Non traumatic right comminuted fracture of the distal right femur just above the femoral implant s/p IM nailing of the right femur 08/31/24.  -- Myeloma panel normal and surgical pathology was normal.   Medication Adjustments/Labs and Tests Ordered: Current medicines are reviewed at length with the patient today.  Concerns regarding medicines are outlined above.  Orders Placed This Encounter  Procedures   ECHOCARDIOGRAM COMPLETE BUBBLE STUDY   No orders of the defined types were placed in this encounter.   Patient Instructions  Medication Instructions:  Your physician recommends that you continue on your current medications as directed. Please refer to the Current Medication list given to you today.  STOP Plavix  on 02/03/2025 *If you need a refill on your cardiac medications before your next appointment, please call your pharmacy*  Lab Work: None needed If you have labs (blood work) drawn today and your tests are completely normal, you will receive your results only by: MyChart Message (if you have MyChart) OR A paper copy in the mail If you have any lab test that is abnormal or we need to change your treatment, we will call you to review the results.  Testing/Procedures: 08/08/2025 Your physician has requested that you have an  echocardiogram. Echocardiography is a painless test that uses sound waves to create images of your heart. It provides your doctor with information about the size and shape of your heart and how well your heart's chambers and valves are working. This procedure takes approximately one hour. There are no restrictions for this procedure. Please do NOT wear cologne, perfume, aftershave, or lotions (deodorant is allowed). Please arrive 15 minutes prior to your appointment time.  Please note: We ask at that you not bring children with you during ultrasound (echo/ vascular) testing. Due to room size and safety concerns, children are not allowed in the ultrasound rooms during exams. Our front office staff cannot provide observation of children in our lobby area while testing is being conducted. An adult accompanying a patient to their appointment will only be allowed in the ultrasound room at the discretion of the ultrasound technician under special circumstances. We apologize for any inconvenience.   Follow-Up: At [facility], you and your health needs are our priority.  As part of our continuing mission to provide you with exceptional heart care, our providers are all part of one team.  This team includes your primary Cardiologist (physician) and Advanced Practice Providers or APPs (Physician Assistants and Nurse Practitioners) who all work together to provide you with the care you need, when you need it.  Your next appointment:   As scheduled on 08/08/2024  Provider:   Izetta Hummer, PA-C  We recommend signing up for the patient portal called MyChart.  Sign up information is provided on this After Visit Summary.  MyChart is used to connect with patients for Virtual Visits (Telemedicine).  Patients are able to view lab/test results, encounter notes, upcoming appointments, etc.  Non-urgent messages can be sent to your provider as well.   To learn more about what you can do with MyChart, go to  [email or web address].          Signed, Lamarr  Sebastian RIGGERS  09/13/2024 1:27 PM     [facility]

## 2024-10-04 ENCOUNTER — Telehealth: Payer: Self-pay | Admitting: Cardiology

## 2024-10-04 MED ORDER — CARVEDILOL 6.25 MG PO TABS: 6.2500 mg | ORAL_TABLET | Freq: Two times a day (BID) | ORAL | 0 refills | Status: AC

## 2024-10-04 NOTE — Telephone Encounter (Signed)
 Refill sent

## 2024-10-04 NOTE — Telephone Encounter (Signed)
*  STAT* If patient is at the pharmacy, call can be transferred to refill team.   1. Which medications need to be refilled? (please list name of each medication and dose if known) carvedilol  (COREG ) 6.25 MG tablet /he ran out last night    2. Would you like to learn more about the convenience, safety, & potential cost savings by using the [facility]? Na      3. Are you open to using the [facility] (Type [facility].    4. Which pharmacy/location (including street and city if local pharmacy) is medication to be sent to? [facility] on [address]    5. Do they need a 30 day or 90 day supply? Just a week or 2 Supply until his mail order comes in

## 2024-10-05 ENCOUNTER — Telehealth: Payer: Self-pay

## 2024-10-05 NOTE — Telephone Encounter (Signed)
 Copied from CRM #[id number]. Topic: Clinical - Medical Advice >> Oct 05, 2024  7:57 AM Ismael A wrote: Reason for CRM: patient wants to know if he can take flu vaccine and covid vaccine with his condition sarcoirdosis - please call back with advise   Called and advised pt that, per Landry Ferrari, NP, he can take both vaccines with sarcoidosis. [T verbalized understanding, NFN

## 2024-10-07 ENCOUNTER — Other Ambulatory Visit (HOSPITAL_COMMUNITY): Payer: Self-pay

## 2024-12-03 ENCOUNTER — Ambulatory Visit: Payer: Self-pay

## 2024-12-03 NOTE — Telephone Encounter (Signed)
 FYI Only or Action Required?: FYI only for provider: appointment scheduled on 01/07/25.  Patient was last seen in primary care on N/A.  Called Nurse Triage reporting Groin Swelling.  Symptoms began several months ago.  Interventions attempted: OTC medications: Benadryl  and Other: apple cider vinegar.  Symptoms are: completely resolved.  Triage Disposition: See Physician Within 24 Hours  Patient/caregiver understands and will follow disposition?: Yes                      Message from Rea ORN sent at 12/03/2024  3:15 PM EST  Reason for Triage: intermittent genital swelling   Reason for Disposition  Pus (white, yellow) or bloody discharge from end of penis  Answer Assessment - Initial Assessment Questions Patient states he is uncircumcised. No swelling currently but states after the swelling the skin looks excessive. Patient is agreeable to go to urgent care and new patient appt made for Dr Chandra with [facility].  1. SYMPTOM: What's the main symptom you're concerned about? (e.g., blood in semen, discharge or pus from penis, itching, pain, rash, swelling)     Swelling to penis shaft, foreskin will turn red color and experience skin tears (1cm). 2. LOCATION: Where is the symptoms located?     Penis shaft 3. ONSET: When did swelling  start?     Intermittent since November 2025, used a urinal during his hospitalization for a broken femur. Swelling occurred yesterday, he took 2 Benadryl  and drank apple cider vinegar with water . It was resolved by this AM.  4. PAIN: Is there any pain? If Yes, ask: How bad is it?  (Scale 1-10; or mild, moderate, severe)     No.  5. URINE: Any difficulty passing urine? If Yes, ask: When was the last time?     No.  6. CAUSE: What do you think is causing the symptoms?     Unsure.  7. OTHER SYMPTOMS: Do you have any other symptoms? (e.g., blood in urine, abdomen pain, fever)    Yellow to white discharge  in underwear.  No urinary retention, urinary pain/burning, penile pain, fever, blood in urine or bloody discharge, abdominal pain.  Protocols used: Penis and Scrotum Symptoms-A-AH

## 2024-12-14 ENCOUNTER — Ambulatory Visit: Admitting: Pulmonary Disease

## 2025-01-07 ENCOUNTER — Ambulatory Visit: Admitting: Family Medicine

## 2025-08-08 ENCOUNTER — Ambulatory Visit: Admitting: Physician Assistant

## 2025-08-08 ENCOUNTER — Ambulatory Visit (HOSPITAL_COMMUNITY)
